# Patient Record
Sex: Female | Born: 1980 | Race: White | Hispanic: No | Marital: Married | Smoking: Current some day smoker
Health system: Southern US, Community
[De-identification: ages and names within clinical notes are randomized; demographics above are authoritative.]

## PROBLEM LIST (undated history)

## (undated) DIAGNOSIS — R002 Palpitations: Secondary | ICD-10-CM

## (undated) DIAGNOSIS — M797 Fibromyalgia: Secondary | ICD-10-CM

## (undated) DIAGNOSIS — F3111 Bipolar disorder, current episode manic without psychotic features, mild: Secondary | ICD-10-CM

## (undated) DIAGNOSIS — F32A Depression, unspecified: Secondary | ICD-10-CM

## (undated) DIAGNOSIS — M1990 Unspecified osteoarthritis, unspecified site: Secondary | ICD-10-CM

## (undated) DIAGNOSIS — K219 Gastro-esophageal reflux disease without esophagitis: Secondary | ICD-10-CM

## (undated) DIAGNOSIS — N189 Chronic kidney disease, unspecified: Secondary | ICD-10-CM

## (undated) DIAGNOSIS — F419 Anxiety disorder, unspecified: Secondary | ICD-10-CM

## (undated) DIAGNOSIS — F329 Major depressive disorder, single episode, unspecified: Secondary | ICD-10-CM

## (undated) DIAGNOSIS — R51 Headache: Secondary | ICD-10-CM

## (undated) HISTORY — PX: TUBAL LIGATION: SHX77

## (undated) HISTORY — PX: LAPAROSCOPIC TOTAL HYSTERECTOMY: SUR800

## (undated) HISTORY — DX: Anxiety disorder, unspecified: F41.9

## (undated) HISTORY — PX: OOPHORECTOMY: SHX86

## (undated) HISTORY — DX: Fibromyalgia: M79.7

## (undated) HISTORY — PX: CYSTOGRAPHY: SUR363

## (undated) HISTORY — DX: Chronic kidney disease, unspecified: N18.9

## (undated) HISTORY — DX: Depression, unspecified: F32.A

## (undated) HISTORY — PX: CHOLECYSTECTOMY: SHX55

---
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
## 2000-08-19 ENCOUNTER — Other Ambulatory Visit: Admission: RE | Admit: 2000-08-19 | Discharge: 2000-08-19 | Payer: Self-pay | Admitting: Obstetrics and Gynecology

## 2004-01-26 ENCOUNTER — Emergency Department (HOSPITAL_COMMUNITY): Admission: EM | Admit: 2004-01-26 | Discharge: 2004-01-26 | Payer: Self-pay | Admitting: Emergency Medicine

## 2004-06-01 ENCOUNTER — Emergency Department (HOSPITAL_COMMUNITY): Admission: EM | Admit: 2004-06-01 | Discharge: 2004-06-01 | Payer: Self-pay | Admitting: Emergency Medicine

## 2004-06-15 ENCOUNTER — Ambulatory Visit: Payer: Self-pay | Admitting: Gastroenterology

## 2004-06-21 ENCOUNTER — Ambulatory Visit (HOSPITAL_COMMUNITY): Admission: RE | Admit: 2004-06-21 | Discharge: 2004-06-21 | Payer: Self-pay | Admitting: Gastroenterology

## 2011-03-28 ENCOUNTER — Emergency Department (HOSPITAL_COMMUNITY)
Admission: EM | Admit: 2011-03-28 | Discharge: 2011-03-28 | Disposition: A | Discharge status: Home / Self Care | Payer: Self-pay | Attending: Emergency Medicine | Admitting: Emergency Medicine

## 2011-03-28 DIAGNOSIS — M5020 Other cervical disc displacement, unspecified cervical region: Secondary | ICD-10-CM | POA: Insufficient documentation

## 2011-03-28 DIAGNOSIS — W19XXXA Unspecified fall, initial encounter: Secondary | ICD-10-CM | POA: Insufficient documentation

## 2011-03-28 DIAGNOSIS — M79609 Pain in unspecified limb: Secondary | ICD-10-CM | POA: Insufficient documentation

## 2011-03-28 DIAGNOSIS — R209 Unspecified disturbances of skin sensation: Secondary | ICD-10-CM | POA: Insufficient documentation

## 2011-03-28 DIAGNOSIS — R5381 Other malaise: Secondary | ICD-10-CM | POA: Insufficient documentation

## 2011-03-29 ENCOUNTER — Ambulatory Visit (HOSPITAL_COMMUNITY)
Admit: 2011-03-29 | Discharge: 2011-03-29 | Disposition: A | Discharge status: Home / Self Care | Payer: Self-pay | Attending: Emergency Medicine | Admitting: Emergency Medicine

## 2011-03-29 DIAGNOSIS — M5030 Other cervical disc degeneration, unspecified cervical region: Secondary | ICD-10-CM | POA: Insufficient documentation

## 2011-03-29 DIAGNOSIS — M79609 Pain in unspecified limb: Secondary | ICD-10-CM | POA: Insufficient documentation

## 2011-03-29 DIAGNOSIS — R29898 Other symptoms and signs involving the musculoskeletal system: Secondary | ICD-10-CM | POA: Insufficient documentation

## 2011-03-29 DIAGNOSIS — M5020 Other cervical disc displacement, unspecified cervical region: Secondary | ICD-10-CM | POA: Insufficient documentation

## 2012-09-16 ENCOUNTER — Other Ambulatory Visit (HOSPITAL_COMMUNITY): Payer: Self-pay | Admitting: Orthopaedic Surgery

## 2012-09-21 ENCOUNTER — Encounter (HOSPITAL_COMMUNITY): Payer: Self-pay | Admitting: Pharmacy Technician

## 2012-09-23 ENCOUNTER — Encounter (HOSPITAL_COMMUNITY)
Admission: RE | Admit: 2012-09-23 | Discharge: 2012-09-23 | Disposition: A | Discharge status: Home / Self Care | Payer: Medicaid Other | Source: Ambulatory Visit | Attending: Orthopaedic Surgery | Admitting: Orthopaedic Surgery

## 2012-09-23 ENCOUNTER — Encounter (HOSPITAL_COMMUNITY): Payer: Self-pay

## 2012-09-23 ENCOUNTER — Other Ambulatory Visit (HOSPITAL_COMMUNITY): Payer: Self-pay | Admitting: Orthopaedic Surgery

## 2012-09-23 HISTORY — DX: Bipolar disorder, current episode manic without psychotic features, mild: F31.11

## 2012-09-23 HISTORY — DX: Gastro-esophageal reflux disease without esophagitis: K21.9

## 2012-09-23 HISTORY — DX: Headache: R51

## 2012-09-23 LAB — SURGICAL PCR SCREEN
MRSA, PCR: NEGATIVE
Staphylococcus aureus: NEGATIVE

## 2012-09-23 LAB — COMPREHENSIVE METABOLIC PANEL
ALT: 12 U/L (ref 0–35)
AST: 14 U/L (ref 0–37)
Albumin: 3.5 g/dL (ref 3.5–5.2)
Alkaline Phosphatase: 117 U/L (ref 39–117)
BUN: 10 mg/dL (ref 6–23)
CO2: 26 mEq/L (ref 19–32)
Calcium: 9 mg/dL (ref 8.4–10.5)
Chloride: 107 mEq/L (ref 96–112)
Creatinine, Ser: 0.67 mg/dL (ref 0.50–1.10)
GFR calc Af Amer: >90 mL/min (ref >90)
GFR calc non Af Amer: >90 mL/min (ref >90)
Glucose, Bld: 83 mg/dL (ref 70–99)
Potassium: 3.9 mEq/L (ref 3.5–5.1)
Sodium: 142 mEq/L (ref 135–145)
Total Bilirubin: 0.2 mg/dL — ABNORMAL LOW (ref 0.3–1.2)
Total Protein: 7.3 g/dL (ref 6.0–8.3)

## 2012-09-23 LAB — URINALYSIS, ROUTINE W REFLEX MICROSCOPIC
Bilirubin Urine: NEGATIVE
Glucose, UA: NEGATIVE mg/dL
Ketones, ur: NEGATIVE mg/dL
Leukocytes, UA: NEGATIVE
Nitrite: NEGATIVE
Protein, ur: 30 mg/dL — AB
Specific Gravity, Urine: 1.022 (ref 1.005–1.030)
Urobilinogen, UA: 0.2 mg/dL (ref 0.0–1.0)
pH: 5.5 (ref 5.0–8.0)

## 2012-09-23 LAB — URINE MICROSCOPIC-ADD ON

## 2012-09-23 LAB — CBC
HCT: 41 % (ref 36.0–46.0)
Hemoglobin: 13.8 g/dL (ref 12.0–15.0)
MCH: 31 pg (ref 26.0–34.0)
MCHC: 33.7 g/dL (ref 30.0–36.0)
MCV: 92.1 fL (ref 78.0–100.0)
Platelets: 420 10*3/uL — ABNORMAL HIGH (ref 150–400)
RBC: 4.45 MIL/uL (ref 3.87–5.11)
RDW: 13.5 % (ref 11.5–15.5)
WBC: 12.3 10*3/uL — ABNORMAL HIGH (ref 4.0–10.5)

## 2012-09-23 LAB — HCG, SERUM, QUALITATIVE: Preg, Serum: NEGATIVE

## 2012-09-23 NOTE — Pre-Procedure Instructions (Signed)
Shannon Spears  09/23/2012   Your procedure is scheduled on:  Sep 25, 2102  Report to [facility] at 5:30 AM.  Call this number if you have problems the morning of surgery: [phone]   Remember:   Do not eat food or drink liquids after midnight.   Take these medicines the morning of surgery with A SIP OF WATER: PRILOSEC   Do not wear jewelry, make-up or nail polish.  Do not wear lotions, powders, or perfumes. You may wear deodorant.  Do not shave 48 hours prior to surgery. Men may shave face and neck.  Do not bring valuables to the hospital.  Contacts, dentures or bridgework may not be worn into surgery.  Leave suitcase in the car. After surgery it may be brought to your room.  For patients admitted to the hospital, checkout time is 11:00 AM the day of  discharge.   Patients discharged the day of surgery will not be allowed to drive  home.  Name and phone number of your driver:   Special Instructions: Shower using CHG 2 nights before surgery and the night before surgery.  If you shower the day of surgery use CHG.  Use special wash - you have one bottle of CHG for all showers.  You should use approximately 1/3 of the bottle for each shower.   Please read over the following fact sheets that you were given: Pain Booklet, Coughing and Deep Breathing and Surgical Site Infection Prevention

## 2012-09-24 MED ORDER — CEFAZOLIN SODIUM-DEXTROSE 2-3 GM-% IV SOLR: 2.0000 g | INTRAVENOUS | Status: AC

## 2012-09-29 NOTE — H&P (Signed)
Shannon Spears is an 32 y.o. female.   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  She has now developed weakness in the right UE and no relief of her pain with conservative treatments of NSAIDS and analgesics.  She has been scheduled for surgery previously but had to cancel secondary to pregnancy. MRI on 09/12/12 showed degenerative disc disease at C4-5 and C5-6.  Right foraminal stenosis and cord flattening at the C4-5 level and progressive stenosis at C5-6 with left disc protrusion which has progressed since the last study and now with left foraminal encroachment.  She wishes to proceed with surgery.  Past Medical History  Diagnosis Date  . GERD (gastroesophageal reflux disease)   . Headache   . Bipolar 1 disorder, manic, mild     Past Surgical History  Procedure Laterality Date  . Cystography      History reviewed. No pertinent family history. Social History:  reports that she has been smoking Cigarettes.  She has been smoking about 0.50 packs per day. She does not have any smokeless tobacco history on file. She reports that  drinks alcohol. She reports that she does not use illicit drugs.  Allergies:  Allergies  Allergen Reactions  . Butrans (Buprenorphine) Shortness Of Breath and Rash  . Codeine     Mouth went numb  . Penicillins Nausea And Vomiting and Swelling  . Tetanus Toxoids     Blister on legs  . Vicodin (Hydrocodone-Acetaminophen) Nausea And Vomiting and Swelling    Medications Prior to Admission  Medication Sig Dispense Refill  . omeprazole (PRILOSEC) 20 MG capsule Take 20 mg by mouth daily.      . Prenatal Vit-Fe Fumarate-FA (PRENATAL MULTIVITAMIN) TABS Take 1 tablet by mouth daily.      . traMADol (ULTRAM) 50 MG tablet Take 50 mg by mouth 2 (two) times daily as needed for pain.        No results found for this or any previous visit (from the past 48 hour(s)). No results  found.  Review of Systems  Constitutional: Negative.   HENT: Positive for neck pain.   Eyes: Negative.   Respiratory: Negative.   Cardiovascular: Negative.   Gastrointestinal: Negative.   Genitourinary: Negative.   Skin: Negative.   Neurological: Positive for focal weakness.       Both arms.  Right > left  Endo/Heme/Allergies: Negative.   Psychiatric/Behavioral: The patient is nervous/anxious.        Bipolar disease.    Blood pressure 107/71, pulse 80, temperature 97.9 F (36.6 C), temperature source Oral, resp. rate 20, last menstrual period 10/02/2012, SpO2 99.00%. Physical Exam  Constitutional: She is oriented to person, place, and time. She appears well-developed and well-nourished.  HENT:  Head: Normocephalic and atraumatic.  Eyes: EOM are normal. Pupils are equal, round, and reactive to light.  Neck:  Decreased ROM C spine.  Cardiovascular: Normal rate.   Respiratory: Effort normal.  GI: Soft.  Musculoskeletal:  Right biceps weakness 5-/5.  Otherwise motor exam intact.  DTRs +1 and symmetric.  Neurological: She is alert and oriented to person, place, and time.  Skin: Skin is warm and dry.     Assessment/Plan C4-5 Degenerative disc disease with right foraminal stenosis and cord flattening. C5-6 Degenerative disc disease with HNP and left foraminal stenosis.  PLAN: Anterior cervical discectomy and fusion C4-5 and C5-6 with allograft, plate and screws.  YATES,MARK C  10/02/2012, 9:36 AM

## 2012-10-01 ENCOUNTER — Encounter (HOSPITAL_COMMUNITY): Payer: Self-pay | Admitting: *Deleted

## 2012-10-02 ENCOUNTER — Encounter (HOSPITAL_COMMUNITY): Payer: Self-pay | Admitting: Vascular Surgery

## 2012-10-02 ENCOUNTER — Inpatient Hospital Stay (HOSPITAL_COMMUNITY): Payer: Medicaid Other

## 2012-10-02 ENCOUNTER — Inpatient Hospital Stay (HOSPITAL_COMMUNITY)
Admission: RE | Admit: 2012-10-02 | Discharge: 2012-10-03 | DRG: 472 | Disposition: A | Discharge status: Home / Self Care | Payer: Medicaid Other | Source: Ambulatory Visit | Attending: Orthopaedic Surgery | Admitting: Orthopaedic Surgery

## 2012-10-02 ENCOUNTER — Inpatient Hospital Stay (HOSPITAL_COMMUNITY): Payer: Medicaid Other | Admitting: Certified Registered Nurse Anesthetist

## 2012-10-02 ENCOUNTER — Encounter (HOSPITAL_COMMUNITY): Payer: Self-pay | Admitting: Certified Registered Nurse Anesthetist

## 2012-10-02 ENCOUNTER — Encounter (HOSPITAL_COMMUNITY)
Admission: RE | Disposition: A | Discharge status: Home / Self Care | Payer: Self-pay | Source: Ambulatory Visit | Attending: Orthopaedic Surgery

## 2012-10-02 DIAGNOSIS — M5020 Other cervical disc displacement, unspecified cervical region: Principal | ICD-10-CM | POA: Diagnosis present

## 2012-10-02 DIAGNOSIS — M5030 Other cervical disc degeneration, unspecified cervical region: Secondary | ICD-10-CM | POA: Diagnosis present

## 2012-10-02 DIAGNOSIS — F17200 Nicotine dependence, unspecified, uncomplicated: Secondary | ICD-10-CM | POA: Diagnosis present

## 2012-10-02 DIAGNOSIS — Z01812 Encounter for preprocedural laboratory examination: Secondary | ICD-10-CM

## 2012-10-02 DIAGNOSIS — Z79899 Other long term (current) drug therapy: Secondary | ICD-10-CM

## 2012-10-02 DIAGNOSIS — Z01818 Encounter for other preprocedural examination: Secondary | ICD-10-CM

## 2012-10-02 DIAGNOSIS — Z01810 Encounter for preprocedural cardiovascular examination: Secondary | ICD-10-CM

## 2012-10-02 DIAGNOSIS — F3111 Bipolar disorder, current episode manic without psychotic features, mild: Secondary | ICD-10-CM | POA: Diagnosis present

## 2012-10-02 HISTORY — PX: ANTERIOR CERVICAL DECOMP/DISCECTOMY FUSION: SHX1161

## 2012-10-02 SURGERY — ANTERIOR CERVICAL DECOMPRESSION/DISCECTOMY FUSION 2 LEVELS
Anesthesia: General | Site: Spine Cervical | Wound class: Clean

## 2012-10-02 MED ORDER — OXYCODONE-ACETAMINOPHEN 5-325 MG PO TABS
1.0000 | ORAL_TABLET | ORAL | Status: DC | PRN
  Administered 2012-10-02 – 2012-10-03 (×3): 2 via ORAL
  Filled 2012-10-02 (×3): qty 2

## 2012-10-02 MED ORDER — EPHEDRINE SULFATE 50 MG/ML IJ SOLN
INTRAMUSCULAR | Status: DC | PRN
  Administered 2012-10-02: 10 mg via INTRAVENOUS
  Administered 2012-10-02: 15 mg via INTRAVENOUS

## 2012-10-02 MED ORDER — FLEET ENEMA 7-19 GM/118ML RE ENEM: 1.0000 | ENEMA | Freq: Once | RECTAL | Status: AC | PRN

## 2012-10-02 MED ORDER — OXYCODONE HCL 5 MG/5ML PO SOLN: 5.0000 mg | Freq: Once | ORAL | Status: AC | PRN

## 2012-10-02 MED ORDER — BUPIVACAINE-EPINEPHRINE 0.25% -1:200000 IJ SOLN
INTRAMUSCULAR | Status: DC | PRN
  Administered 2012-10-02: 8 mL

## 2012-10-02 MED ORDER — ROCURONIUM BROMIDE 100 MG/10ML IV SOLN
INTRAVENOUS | Status: DC | PRN
  Administered 2012-10-02: 50 mg via INTRAVENOUS

## 2012-10-02 MED ORDER — HYDROMORPHONE HCL PF 1 MG/ML IJ SOLN
INTRAMUSCULAR | Status: AC
  Administered 2012-10-02: 0.5 mg via INTRAVENOUS
  Filled 2012-10-02: qty 1

## 2012-10-02 MED ORDER — METHOCARBAMOL 500 MG PO TABS
ORAL_TABLET | ORAL | Status: AC
  Administered 2012-10-02: 500 mg via ORAL
  Filled 2012-10-02: qty 1

## 2012-10-02 MED ORDER — OXYCODONE HCL 5 MG PO TABS
ORAL_TABLET | ORAL | Status: AC
  Filled 2012-10-02: qty 1

## 2012-10-02 MED ORDER — ACETAMINOPHEN 325 MG PO TABS: 650.0000 mg | ORAL_TABLET | ORAL | Status: DC | PRN

## 2012-10-02 MED ORDER — LACTATED RINGERS IV SOLN
INTRAVENOUS | Status: DC
  Administered 2012-10-02: 09:00:00 via INTRAVENOUS

## 2012-10-02 MED ORDER — FENTANYL CITRATE 0.05 MG/ML IJ SOLN
INTRAMUSCULAR | Status: DC | PRN
  Administered 2012-10-02: 50 ug via INTRAVENOUS
  Administered 2012-10-02: 100 ug via INTRAVENOUS
  Administered 2012-10-02: 150 ug via INTRAVENOUS
  Administered 2012-10-02: 50 ug via INTRAVENOUS

## 2012-10-02 MED ORDER — CEFAZOLIN SODIUM-DEXTROSE 2-3 GM-% IV SOLR
INTRAVENOUS | Status: AC
  Filled 2012-10-02: qty 50

## 2012-10-02 MED ORDER — MENTHOL 3 MG MT LOZG: 1.0000 | LOZENGE | OROMUCOSAL | Status: DC | PRN

## 2012-10-02 MED ORDER — BISACODYL 10 MG RE SUPP: 10.0000 mg | Freq: Every day | RECTAL | Status: DC | PRN

## 2012-10-02 MED ORDER — KETOROLAC TROMETHAMINE 30 MG/ML IJ SOLN
30.0000 mg | Freq: Four times a day (QID) | INTRAMUSCULAR | Status: AC
  Administered 2012-10-02 – 2012-10-03 (×3): 30 mg via INTRAVENOUS
  Filled 2012-10-02 (×3): qty 1

## 2012-10-02 MED ORDER — PANTOPRAZOLE SODIUM 40 MG PO TBEC
40.0000 mg | DELAYED_RELEASE_TABLET | Freq: Every day | ORAL | Status: DC
  Administered 2012-10-02 – 2012-10-03 (×2): 40 mg via ORAL
  Filled 2012-10-02 (×2): qty 1

## 2012-10-02 MED ORDER — METHOCARBAMOL 500 MG PO TABS: 500.0000 mg | ORAL_TABLET | Freq: Four times a day (QID) | ORAL | Status: DC | PRN

## 2012-10-02 MED ORDER — CEFAZOLIN SODIUM 1-5 GM-% IV SOLN
1.0000 g | Freq: Three times a day (TID) | INTRAVENOUS | Status: AC
  Administered 2012-10-02 – 2012-10-03 (×2): 1 g via INTRAVENOUS
  Filled 2012-10-02 (×2): qty 50

## 2012-10-02 MED ORDER — SODIUM CHLORIDE 0.9 % IJ SOLN
3.0000 mL | Freq: Two times a day (BID) | INTRAMUSCULAR | Status: DC
  Administered 2012-10-02 – 2012-10-03 (×2): 3 mL via INTRAVENOUS

## 2012-10-02 MED ORDER — KCL IN DEXTROSE-NACL 20-5-0.45 MEQ/L-%-% IV SOLN
INTRAVENOUS | Status: DC
  Administered 2012-10-02: 15:00:00 via INTRAVENOUS
  Filled 2012-10-02 (×4): qty 1000

## 2012-10-02 MED ORDER — PHENOL 1.4 % MT LIQD
1.0000 | OROMUCOSAL | Status: DC | PRN
  Administered 2012-10-02: 1 via OROMUCOSAL
  Filled 2012-10-02: qty 177

## 2012-10-02 MED ORDER — 0.9 % SODIUM CHLORIDE (POUR BTL) OPTIME
TOPICAL | Status: DC | PRN
  Administered 2012-10-02: 1000 mL

## 2012-10-02 MED ORDER — ONDANSETRON HCL 4 MG/2ML IJ SOLN: 4.0000 mg | Freq: Once | INTRAMUSCULAR | Status: DC | PRN

## 2012-10-02 MED ORDER — THROMBIN 5000 UNITS EX SOLR
CUTANEOUS | Status: AC
  Filled 2012-10-02: qty 5000

## 2012-10-02 MED ORDER — SODIUM CHLORIDE 0.9 % IV SOLN: 250.0000 mL | INTRAVENOUS | Status: DC

## 2012-10-02 MED ORDER — MORPHINE SULFATE 2 MG/ML IJ SOLN
1.0000 mg | INTRAMUSCULAR | Status: DC | PRN
  Administered 2012-10-02 – 2012-10-03 (×2): 2 mg via INTRAVENOUS
  Filled 2012-10-02 (×2): qty 1

## 2012-10-02 MED ORDER — LACTATED RINGERS IV SOLN
INTRAVENOUS | Status: DC | PRN
  Administered 2012-10-02 (×2): via INTRAVENOUS

## 2012-10-02 MED ORDER — ACETAMINOPHEN 650 MG RE SUPP: 650.0000 mg | RECTAL | Status: DC | PRN

## 2012-10-02 MED ORDER — MIDAZOLAM HCL 5 MG/5ML IJ SOLN
INTRAMUSCULAR | Status: DC | PRN
  Administered 2012-10-02: 2 mg via INTRAVENOUS

## 2012-10-02 MED ORDER — ONDANSETRON HCL 4 MG/2ML IJ SOLN
INTRAMUSCULAR | Status: DC | PRN
  Administered 2012-10-02: 4 mg via INTRAVENOUS

## 2012-10-02 MED ORDER — ONDANSETRON HCL 4 MG/2ML IJ SOLN
4.0000 mg | INTRAMUSCULAR | Status: DC | PRN
  Administered 2012-10-02: 4 mg via INTRAVENOUS
  Filled 2012-10-02: qty 2

## 2012-10-02 MED ORDER — SODIUM CHLORIDE 0.9 % IJ SOLN: 3.0000 mL | INTRAMUSCULAR | Status: DC | PRN

## 2012-10-02 MED ORDER — METHOCARBAMOL 100 MG/ML IJ SOLN
500.0000 mg | Freq: Four times a day (QID) | INTRAVENOUS | Status: DC | PRN
  Filled 2012-10-02: qty 5

## 2012-10-02 MED ORDER — MEPERIDINE HCL 25 MG/ML IJ SOLN: 6.2500 mg | INTRAMUSCULAR | Status: DC | PRN

## 2012-10-02 MED ORDER — PROPOFOL 10 MG/ML IV BOLUS
INTRAVENOUS | Status: DC | PRN
  Administered 2012-10-02: 200 mg via INTRAVENOUS

## 2012-10-02 MED ORDER — DIPHENHYDRAMINE HCL 12.5 MG/5ML PO ELIX
12.5000 mg | ORAL_SOLUTION | Freq: Four times a day (QID) | ORAL | Status: DC | PRN
  Administered 2012-10-02: 12.5 mg via ORAL
  Filled 2012-10-02: qty 10

## 2012-10-02 MED ORDER — METHOCARBAMOL 500 MG PO TABS
500.0000 mg | ORAL_TABLET | Freq: Four times a day (QID) | ORAL | Status: DC | PRN
  Administered 2012-10-02 – 2012-10-03 (×3): 500 mg via ORAL
  Filled 2012-10-02 (×3): qty 1

## 2012-10-02 MED ORDER — SENNOSIDES-DOCUSATE SODIUM 8.6-50 MG PO TABS: 1.0000 | ORAL_TABLET | Freq: Every evening | ORAL | Status: DC | PRN

## 2012-10-02 MED ORDER — THROMBIN 5000 UNITS EX SOLR
OROMUCOSAL | Status: DC | PRN
  Administered 2012-10-02: 11:00:00 via TOPICAL

## 2012-10-02 MED ORDER — CEFAZOLIN SODIUM-DEXTROSE 2-3 GM-% IV SOLR
2.0000 g | Freq: Once | INTRAVENOUS | Status: AC
  Administered 2012-10-02: 2 g via INTRAVENOUS

## 2012-10-02 MED ORDER — OXYCODONE HCL 5 MG PO TABS
5.0000 mg | ORAL_TABLET | Freq: Once | ORAL | Status: AC | PRN
  Administered 2012-10-02: 5 mg via ORAL

## 2012-10-02 MED ORDER — LIDOCAINE HCL (CARDIAC) 20 MG/ML IV SOLN
INTRAVENOUS | Status: DC | PRN
  Administered 2012-10-02: 100 mg via INTRAVENOUS

## 2012-10-02 MED ORDER — GLYCOPYRROLATE 0.2 MG/ML IJ SOLN
INTRAMUSCULAR | Status: DC | PRN
  Administered 2012-10-02: .4 mg via INTRAVENOUS

## 2012-10-02 MED ORDER — KETOROLAC TROMETHAMINE 30 MG/ML IJ SOLN
INTRAMUSCULAR | Status: AC
  Administered 2012-10-02: 30 mg via INTRAVENOUS
  Filled 2012-10-02: qty 1

## 2012-10-02 MED ORDER — DOCUSATE SODIUM 100 MG PO CAPS
100.0000 mg | ORAL_CAPSULE | Freq: Two times a day (BID) | ORAL | Status: DC
  Administered 2012-10-02 – 2012-10-03 (×3): 100 mg via ORAL
  Filled 2012-10-02 (×3): qty 1

## 2012-10-02 MED ORDER — PRENATAL MULTIVITAMIN CH
1.0000 | ORAL_TABLET | Freq: Every day | ORAL | Status: DC
  Administered 2012-10-03: 1 via ORAL
  Filled 2012-10-02 (×2): qty 1

## 2012-10-02 MED ORDER — ZOLPIDEM TARTRATE 5 MG PO TABS: 5.0000 mg | ORAL_TABLET | Freq: Every evening | ORAL | Status: DC | PRN

## 2012-10-02 MED ORDER — OXYCODONE-ACETAMINOPHEN 5-325 MG PO TABS: 1.0000 | ORAL_TABLET | ORAL | Status: DC | PRN

## 2012-10-02 MED ORDER — HYDROMORPHONE HCL PF 1 MG/ML IJ SOLN
0.2500 mg | INTRAMUSCULAR | Status: DC | PRN
  Administered 2012-10-02 (×4): 0.5 mg via INTRAVENOUS

## 2012-10-02 MED ORDER — NEOSTIGMINE METHYLSULFATE 1 MG/ML IJ SOLN
INTRAMUSCULAR | Status: DC | PRN
  Administered 2012-10-02: 3 mg via INTRAVENOUS

## 2012-10-02 SURGICAL SUPPLY — 53 items
BENZOIN TINCTURE PRP APPL 2/3 (GAUZE/BANDAGES/DRESSINGS) ×2 IMPLANT
BIT DRILL SKYLINE 12MM (BIT) ×1 IMPLANT
BLADE SURG ROTATE 9660 (MISCELLANEOUS) IMPLANT
BONE CERV LORDOTIC 14.5X12X6 (Bone Implant) ×4 IMPLANT
BUR ROUND FLUTED 4 SOFT TCH (BURR) IMPLANT
CLOTH BEACON ORANGE TIMEOUT ST (SAFETY) ×2 IMPLANT
CLSR STERI-STRIP ANTIMIC 1/2X4 (GAUZE/BANDAGES/DRESSINGS) ×2 IMPLANT
COLLAR CERV LO CONTOUR FIRM DE (SOFTGOODS) ×2 IMPLANT
CORDS BIPOLAR (ELECTRODE) IMPLANT
COVER SURGICAL LIGHT HANDLE (MISCELLANEOUS) ×2 IMPLANT
DRAPE C-ARM 42X72 X-RAY (DRAPES) ×2 IMPLANT
DRAPE MICROSCOPE LEICA (MISCELLANEOUS) ×2 IMPLANT
DRAPE PROXIMA HALF (DRAPES) ×2 IMPLANT
DRILL BIT SKYLINE 12MM (BIT) ×1
DURAPREP 6ML APPLICATOR 50/CS (WOUND CARE) ×2 IMPLANT
ELECT COATED BLADE 2.86 ST (ELECTRODE) ×2 IMPLANT
ELECT REM PT RETURN 9FT ADLT (ELECTROSURGICAL) ×2
ELECTRODE REM PT RTRN 9FT ADLT (ELECTROSURGICAL) ×1 IMPLANT
EVACUATOR 1/8 PVC DRAIN (DRAIN) ×2 IMPLANT
GAUZE XEROFORM 1X8 LF (GAUZE/BANDAGES/DRESSINGS) ×2 IMPLANT
GLOVE BIOGEL PI IND STRL 7.5 (GLOVE) ×1 IMPLANT
GLOVE BIOGEL PI IND STRL 8 (GLOVE) ×1 IMPLANT
GLOVE BIOGEL PI INDICATOR 7.5 (GLOVE) ×1
GLOVE BIOGEL PI INDICATOR 8 (GLOVE) ×1
GLOVE ECLIPSE 7.0 STRL STRAW (GLOVE) ×2 IMPLANT
GLOVE ORTHO TXT STRL SZ7.5 (GLOVE) ×2 IMPLANT
GOWN PREVENTION PLUS LG XLONG (DISPOSABLE) IMPLANT
GOWN PREVENTION PLUS XLARGE (GOWN DISPOSABLE) ×2 IMPLANT
GOWN STRL NON-REIN LRG LVL3 (GOWN DISPOSABLE) ×4 IMPLANT
HEAD HALTER (SOFTGOODS) ×2 IMPLANT
HEMOSTAT SURGICEL 2X14 (HEMOSTASIS) IMPLANT
KIT BASIN OR (CUSTOM PROCEDURE TRAY) ×2 IMPLANT
KIT ROOM TURNOVER OR (KITS) ×2 IMPLANT
MANIFOLD NEPTUNE II (INSTRUMENTS) ×2 IMPLANT
NEEDLE 25GX 5/8IN NON SAFETY (NEEDLE) ×2 IMPLANT
NS IRRIG 1000ML POUR BTL (IV SOLUTION) ×2 IMPLANT
PACK ORTHO CERVICAL (CUSTOM PROCEDURE TRAY) ×2 IMPLANT
PAD ARMBOARD 7.5X6 YLW CONV (MISCELLANEOUS) ×4 IMPLANT
PATTIES SURGICAL .5 X.5 (GAUZE/BANDAGES/DRESSINGS) IMPLANT
PIN TEMP SKYLINE THREADED (PIN) ×2 IMPLANT
PLATE TWO LEVEL SKYLINE 30MM (Plate) ×2 IMPLANT
SCREW VARIABLE SELF TAP 12MM (Screw) ×12 IMPLANT
SPONGE GAUZE 4X4 12PLY (GAUZE/BANDAGES/DRESSINGS) ×2 IMPLANT
SPONGE SURGIFOAM ABS GEL SZ50 (HEMOSTASIS) ×2 IMPLANT
STRIP CLOSURE SKIN 1/2X4 (GAUZE/BANDAGES/DRESSINGS) ×2 IMPLANT
SURGIFLO TRUKIT (HEMOSTASIS) IMPLANT
SUT VIC AB 3-0 X1 27 (SUTURE) ×2 IMPLANT
SUT VICRYL 4-0 PS2 18IN ABS (SUTURE) ×4 IMPLANT
SYR 30ML SLIP (SYRINGE) ×2 IMPLANT
TAPE CLOTH SURG 4X10 WHT LF (GAUZE/BANDAGES/DRESSINGS) ×2 IMPLANT
TOWEL OR 17X24 6PK STRL BLUE (TOWEL DISPOSABLE) ×2 IMPLANT
TOWEL OR 17X26 10 PK STRL BLUE (TOWEL DISPOSABLE) ×2 IMPLANT
WATER STERILE IRR 1000ML POUR (IV SOLUTION) ×2 IMPLANT

## 2012-10-02 NOTE — Anesthesia Postprocedure Evaluation (Signed)
Anesthesia Post Note  Patient: Shannon Spears  Procedure(s) Performed: Procedure(s) (LRB): C4-5, C5-6 anterior cervical discectomy and fusion, allograft plate (N/A)  Anesthesia type: general  Patient location: PACU  Post pain: Pain level controlled  Post assessment: Patient's Cardiovascular Status Stable  Last Vitals:  Filed Vitals:   10/02/12 1430  BP: 107/66  Pulse: 83  Temp: 36.7 C  Resp: 23    Post vital signs: Reviewed and stable  Level of consciousness: sedated  Complications: No apparent anesthesia complications

## 2012-10-02 NOTE — Preoperative (Signed)
Beta Blockers   Reason not to administer Beta Blockers:Not Applicable 

## 2012-10-02 NOTE — Anesthesia Preprocedure Evaluation (Addendum)
Anesthesia Evaluation  Patient identified by MRN, date of birth, ID band Patient awake    Reviewed: Allergy & Precautions, H&P , NPO status , Patient's Chart, lab work & pertinent test results, reviewed documented beta blocker date and time   Airway Mallampati: I TM Distance: >3 FB Neck ROM: Full    Dental  (+) Edentulous Upper, Partial Lower and Dental Advisory Given   Pulmonary Current Smoker,  Half pack per day cigs         Cardiovascular     Neuro/Psych    GI/Hepatic GERD-  Medicated and Controlled,  Endo/Other    Renal/GU      Musculoskeletal   Abdominal   Peds  Hematology   Anesthesia Other Findings   Reproductive/Obstetrics                         Anesthesia Physical Anesthesia Plan  ASA: II  Anesthesia Plan: General   Post-op Pain Management:    Induction: Intravenous  Airway Management Planned: Oral ETT  Additional Equipment:   Intra-op Plan:   Post-operative Plan: Extubation in OR  Informed Consent:   Plan Discussed with: CRNA and Surgeon  Anesthesia Plan Comments:         Anesthesia Quick Evaluation

## 2012-10-02 NOTE — Progress Notes (Signed)
Lunch relief by MA Benson Porcaro RN 

## 2012-10-02 NOTE — Progress Notes (Signed)
Patient ID: Shannon Spears, female   DOB: 08-07-1980, 32 y.o.   MRN: [id number] Anticipate discharge tomorrow if doing well.  RX for Percocet and Robaxin on chart as well as AVS.  Drain will be removed on rounds and mepilex place on wound before discharge.

## 2012-10-02 NOTE — Anesthesia Procedure Notes (Signed)
Procedure Name: Intubation Date/Time: 10/02/2012 10:28 AM Performed by: Rogelia Boga Pre-anesthesia Checklist: Patient identified, Emergency Drugs available, Suction available, Patient being monitored and Timeout performed Patient Re-evaluated:Patient Re-evaluated prior to inductionOxygen Delivery Method: Circle system utilized Preoxygenation: Pre-oxygenation with 100% oxygen Intubation Type: IV induction Ventilation: Mask ventilation without difficulty Laryngoscope Size: Mac and 4 Grade View: Grade I Tube type: Oral Tube size: 7.5 mm Number of attempts: 1 Airway Equipment and Method: Stylet Placement Confirmation: ETT inserted through vocal cords under direct vision,  positive ETCO2 and breath sounds checked- equal and bilateral Secured at: 22 cm Tube secured with: Tape Dental Injury: Teeth and Oropharynx as per pre-operative assessment

## 2012-10-02 NOTE — Progress Notes (Signed)
UR COMPLETED  

## 2012-10-02 NOTE — Interval H&P Note (Signed)
History and Physical Interval Note:  10/02/2012 9:38 AM  Shannon Spears  has presented today for surgery, with the diagnosis of C4-5, C5-6 HNP, stenosis  The various methods of treatment have been discussed with the patient and family. After consideration of risks, benefits and other options for treatment, the patient has consented to  Procedure(s): C4-5, C5-6 anterior cervical discectomy and fusion, allograft plate (N/A) as a surgical intervention .  The patient's history has been reviewed, patient examined, no change in status, stable for surgery.  I have reviewed the patient's chart and labs.  Questions were answered to the patient's satisfaction.     Nelma Phagan C

## 2012-10-02 NOTE — Brief Op Note (Cosign Needed)
10/02/2012  12:40 PM  PATIENT:  Shannon Spears  32 y.o. female  PRE-OPERATIVE DIAGNOSIS:  C4-5, C5-6 HNP, stenosis  POST-OPERATIVE DIAGNOSIS:  C4-5, C5-6 HNP, stenosis  PROCEDURE:  Procedure(s): C4-5, C5-6 anterior cervical discectomy and fusion, allograft plate (N/A)  SURGEON:  Surgeon(s) and Role:    * Eldred Manges, MD - Primary  PHYSICIAN ASSISTANT: Maud Deed [facility]  ASSISTANTS: none   ANESTHESIA:   general  EBL:  Total I/O In: 1000 [I.V.:1000] Out: 50 [Blood:50]  BLOOD ADMINISTERED:none  DRAINS: (1) Hemovact drain(s) in the anterior neck with  Suction Open   LOCAL MEDICATIONS USED:  MARCAINE     SPECIMEN:  No Specimen  DISPOSITION OF SPECIMEN:  N/A  COUNTS:  YES  TOURNIQUET:  * No tourniquets in log *  DICTATION: .Note written in EPIC  PLAN OF CARE: Admit to inpatient   PATIENT DISPOSITION:  PACU - hemodynamically stable.   Delay start of Pharmacological VTE agent (>24hrs) due to surgical blood loss or risk of bleeding: yes

## 2012-10-02 NOTE — Transfer of Care (Signed)
Immediate Anesthesia Transfer of Care Note  Patient: Shannon Spears  Procedure(s) Performed: Procedure(s): C4-5, C5-6 anterior cervical discectomy and fusion, allograft plate (N/A)  Patient Location: PACU  Anesthesia Type:General  Level of Consciousness: awake, alert , oriented and patient cooperative  Airway & Oxygen Therapy: Patient Spontanous Breathing and Patient connected to nasal cannula oxygen  Post-op Assessment: Report given to PACU RN, Post -op Vital signs reviewed and stable and Patient moving all extremities X 4  Post vital signs: Reviewed and stable  Complications: No apparent anesthesia complications

## 2012-10-03 NOTE — Progress Notes (Signed)
Subjective: Pt stable - having neck but no arm pain   Objective: Vital signs in last 24 hours: Temp:  [97.7 F (36.5 C)-98.3 F (36.8 C)] 97.7 F (36.5 C) (03/01 0600) Pulse Rate:  [64-109] 70 (03/01 0600) Resp:  [10-23] 18 (03/01 0600) BP: (90-119)/(60-74) 90/74 mmHg (03/01 0600) SpO2:  [97 %-99 %] 97 % (03/01 0600)  Intake/Output from previous day: 02/28 0701 - 03/01 0700 In: 1600 [I.V.:1600] Out: 50 [Blood:50] Intake/Output this shift: Total I/O In: 20 [P.O.:20] Out: -   Exam:  Neurovascular intact Sensation intact distally Intact pulses distally  Labs: No results found for this basename: HGB,  in the last 72 hours No results found for this basename: WBC, RBC, HCT, PLT,  in the last 72 hours No results found for this basename: NA, K, CL, CO2, BUN, CREATININE, GLUCOSE, CALCIUM,  in the last 72 hours No results found for this basename: LABPT, INR,  in the last 72 hours  Assessment/Plan: Pt stable - will tentatively plan on dc this afternoon after transition to oral pain meds   DEAN,GREGORY SCOTT 10/03/2012, 9:50 AM

## 2012-10-03 NOTE — Op Note (Signed)
Shannon Spears, Shannon Spears              ACCOUNT NO.:  [id number]  MEDICAL RECORD NO.:  [id number]  LOCATION:  [id number]                        FACILITY:  [facility]  PHYSICIAN:  Alexiz Cothran C. Ophelia Charter, M.D.    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  PROCEDURE:  C4-C5, C5-C6 anterior cervical diskectomy and fusion, allograft and plate.  SURGEON:  Ashrita Chrismer C. Ophelia Charter, MD  ASSISTANT:  Maud Deed, PA-C, medically necessary and present for the entire procedure.  ANESTHESIA:  GOT plus 8 mL of Marcaine local.  DRAINS:  One Hemovac, neck.  COMPLICATIONS:  None.  ESTIMATED BLOOD LOSS:  50 mL.  PROCEDURE IN DETAIL:  After induction of general anesthesia, orotracheal intubation, and halter traction without weight, neck was prepped with DuraPrep, arms were tucked to the side with pads over the ulnar nerve. No wrist restraints were applied.  Neck care was scored with towels. Sterile skin marker was used on prominent skin line at the appropriate level and Betadine, Steri-Drape was sterile Mayo stand at the head, thyroid sheet draped, half sheet at the top.  Time-out procedure was completed.  Appropriate images were displayed. The patient had disk extrusion at C5-C6 with moderate central stenosis and disk caudally migrated behind the C6 vertebrae.  At the C4-C5 level, disk was E-centered to the right with significant compression and lateral right fragment.  Platysma was split in line with the skin incision.  Gelpi retractor was placed, blunt dissection down the longus coli.  Short 25 needle placed C5-C6 level, confirmed with lateral C-arm.  C5-C6 had a chunk removed from it with the scalpel and pituitaries for marking it and then also the level just  above the C4-C5.  Cloward retractor blades were placed right and left, smooth blades up and down.  Operative microscope was draped, brought in, and diskectomy was performed using Cloward curettes. Bur was used, endplates were curetted, rasps.  Trial sizing showed that 6 gave excellent fit.  Operative microscope was used for take down of the posterior aspect of the disk, posterior longitudinal ligament, and removal of the extruded fragments that were caudally migrated causing compression.  Dura was decompressed from right to left side, small amount of bleeding occurred on the right gutter.  Small pieces of Gelfoam was placed.  It was dry at the time of the graft insertion. Once the graft was countersunk 2 mm, there was some bleeding epidurally it came up around the edges of the graft.  Self-retaining Cloward were moved up to 4 5 level.  Process was repeated.  Disk fragments were extruded centrally into the right and several amount of disk material was removed including free fragment, which was teased up and removed on the right side.  Trial sizes showed a 6 lordotic cortical cancellous graft DePuy gave a nice fit.  Skyline 2-level plate was selected.  It was checked under fluoroscopy, AP and lateral.  Prong was used for stabilization.  This was a 24-mm plate second from the  small size, 2- level plate in the set, and 12 mm screws were placed x6.  Final pictures were taken showing good position and alignment.  All Gelfoam was removed prior to placement of the graft.  Grafts lined up well in the midline in the plate, and final spot radiograph showed good position and alignment. Hemovac was placed in and out technique in line with skin incision on the left side.  Platysma was reapproximated with 3-0 Vicryl, 4-0 Vicryl subcuticular closure.  Tincture of benzoin, Steri-Strips, 4 x 4's dressing and soft cervical collar.  Instrument count and needle count was correct.     Shannon Spears C. Ophelia Charter,  M.D.     MCY/MEDQ  D:  10/02/2012  T:  10/03/2012  Job:  [id number]

## 2012-10-03 NOTE — Progress Notes (Signed)
Occupational Therapy Evaluation Patient Details Name: Shannon Spears MRN: [id number] DOB: [birth year] Today's Date: 10/03/2012 Time: 4098-1191 OT Time Calculation (min): 25 min  OT Assessment / Plan / Recommendation Clinical Impression  32 yo s/p ACDF. Completed all education regarding compensatory techniques and cervical precautions regarding ADL. Husband present and verbalized understaning. Pt asking about taking a shower, per D/C instructions, pt to not shower until after MD visit. Notified nsg of allergic reaction to adhesive.Written information given. Ready for D/C.    OT Assessment  Patient does not need any further OT services    Follow Up Recommendations  No OT follow up    Barriers to Discharge  none    Equipment Recommendations  None recommended by OT    Recommendations for Other Services    Frequency    eval only   Precautions / Restrictions Precautions Precautions: Cervical Required Braces or Orthoses: Cervical Brace (wear at all times) Cervical Brace: Soft collar   Pertinent Vitals/Pain C/o neck pain. nsg aware    ADL  Grooming: Set up Upper Body Bathing: Set up Lower Body Bathing: Set up Where Assessed - Lower Body Bathing: Unsupported sit to stand Upper Body Dressing: Set up Where Assessed - Upper Body Dressing: Unsupported sit to stand Lower Body Dressing: Set up;Supervision/safety Where Assessed - Lower Body Dressing: Unsupported sit to stand Toilet Transfer: Modified independent Toilet Transfer Method: Sit to Barista: Comfort height toilet Toileting - Clothing Manipulation and Hygiene: Modified independent Where Assessed - Engineer, mining and Hygiene: Standing Tub/Shower Transfer: Therapist, sports Method: Ambulating Transfers/Ambulation Related to ADLs: mod I ADL Comments: Educated pt on cervical precautions and compensatory techniques for ADL    OT Diagnosis:    OT Problem List:   OT  Treatment Interventions:     OT Goals Acute Rehab OT Goals OT Goal Formulation:  (eval only)  Visit Information  Last OT Received On: 10/03/12 Assistance Needed: +1    Subjective Data      Prior Functioning     Home Living Lives With: Spouse Available Help at Discharge: Family;Available 24 hours/day Type of Home: House Home Access: Stairs to enter [employer] of Steps: 3 Home Layout: Two level Alternate Level Stairs-Number of Steps: 14 Bathroom Shower/Tub: Associate Professor: Yes How Accessible: Accessible via walker Home Adaptive Equipment: None Prior Function Level of Independence: Independent Able to Take Stairs?: Yes Driving: Yes         Vision/Perception     Cognition  Cognition Overall Cognitive Status: Appears within functional limits for tasks assessed/performed    Extremity/Trunk Assessment Right Upper Extremity Assessment RUE ROM/Strength/Tone: [facility] for tasks assessed Left Upper Extremity Assessment LUE ROM/Strength/Tone: Within functional levels Right Lower Extremity Assessment RLE ROM/Strength/Tone: Within functional levels Left Lower Extremity Assessment LLE ROM/Strength/Tone: Within functional levels Trunk Assessment Trunk Assessment: Normal     Mobility Bed Mobility Bed Mobility: Rolling Left;Left Sidelying to Sit Rolling Left: 5: Supervision Left Sidelying to Sit: 5: Supervision;HOB elevated Transfers Transfers: Sit to Stand;Stand to Sit Sit to Stand: 7: Independent Stand to Sit: 7: Independent     Exercise     Balance  WFl   End of Session OT - End of Session Activity Tolerance: Patient tolerated treatment well Patient left: in chair;with call bell/phone within reach;with family/visitor present Nurse Communication: Mobility status;Other (comment) (ready for D/C )  GO     Dallen Bunte,HILLARY 10/03/2012, 4:31 PM [facility], OTR/L  [phone] 10/03/2012

## 2012-10-05 ENCOUNTER — Encounter (HOSPITAL_COMMUNITY): Payer: Self-pay | Admitting: Orthopaedic Surgery

## 2012-10-20 NOTE — Discharge Summary (Signed)
Physician Discharge Summary  Patient ID: Shannon Spears MRN: [id number] DOB/AGE: 12/02/1980 32 y.o.  Admit date: 10/02/2012 Discharge date: 10/03/2012  Admission Diagnoses:  HNP (herniated nucleus pulposus), cervical C4-5 and C5-6  Discharge Diagnoses:  Principal Problem:   HNP (herniated nucleus pulposus), cervical C4-5 and C5-6  Past Medical History  Diagnosis Date  . GERD (gastroesophageal reflux disease)   . Headache   . Bipolar 1 disorder, manic, mild     Surgeries: Procedure(s): C4-5, C5-6 anterior cervical discectomy and fusion, allograft plate on 09/04/8655   Consultants (if any):    Discharged Condition: Improved  Hospital Course: Shannon Spears is an 32 y.o. female who was admitted 10/02/2012 with a diagnosis of HNP (herniated nucleus pulposus), cervical and went to the operating room on 10/02/2012 and underwent the above named procedures.    She was given perioperative antibiotics:  Anti-infectives   Start     Dose/Rate Route Frequency Ordered Stop   10/02/12 1800  ceFAZolin (ANCEF) IVPB 1 g/50 mL premix     1 g 100 mL/hr over 30 Minutes Intravenous Every 8 hours 10/02/12 1456 10/03/12 0236   10/02/12 1011  ceFAZolin (ANCEF) 2-3 GM-% IVPB SOLR    Comments:  JAMES, KAREN: cabinet override      10/02/12 1011 10/02/12 2214   10/02/12 1000  ceFAZolin (ANCEF) IVPB 2 g/50 mL premix     2 g 100 mL/hr over 30 Minutes Intravenous  Once 10/02/12 0950 10/02/12 1015   09/25/12 0600  ceFAZolin (ANCEF) IVPB 2 g/50 mL premix     2 g 100 mL/hr over 30 Minutes Intravenous On call to O.R. 09/24/12 1358 09/26/12 0559    .  She was given sequential compression devices, early ambulation for DVT prophylaxis.  She benefited maximally from the hospital stay and there were no complications.    Recent vital signs:  Filed Vitals:   10/03/12 1338  BP: 123/77  Pulse: 78  Temp: 98.4 F (36.9 C)  Resp: 18    Recent laboratory studies:  Lab Results  Component Value Date   HGB  13.8 09/23/2012   Lab Results  Component Value Date   WBC 12.3* 09/23/2012   PLT 420* 09/23/2012   No results found for this basename: INR   Lab Results  Component Value Date   NA 142 09/23/2012   K 3.9 09/23/2012   CL 107 09/23/2012   CO2 26 09/23/2012   BUN 10 09/23/2012   CREATININE 0.67 09/23/2012   GLUCOSE 83 09/23/2012    Discharge Medications:     Medication List    TAKE these medications       methocarbamol 500 MG tablet  Commonly known as:  ROBAXIN  Take 1 tablet (500 mg total) by mouth every 6 (six) hours as needed (spasm).     omeprazole 20 MG capsule  Commonly known as:  PRILOSEC  Take 20 mg by mouth daily.     oxyCODONE-acetaminophen 5-325 MG per tablet  Commonly known as:  ROXICET  Take 1-2 tablets by mouth every 4 (four) hours as needed for pain.     prenatal multivitamin Tabs  Take 1 tablet by mouth daily.     traMADol 50 MG tablet  Commonly known as:  ULTRAM  Take 50 mg by mouth 2 (two) times daily as needed for pain.        Diagnostic Studies: Dg Chest 2 View  09/23/2012  *RADIOLOGY REPORT*  Clinical Data: Preop, cough.  CHEST - 2 VIEW  Comparison:  None.  Findings: Trachea is midline.  Heart size normal.  Lungs are clear. No pleural fluid.  IMPRESSION: Negative.   Original Report Authenticated By: Leanna Battles, M.D.    Dg Cervical Spine 2-3 Views  10/02/2012  *RADIOLOGY REPORT*  Clinical Data: C4-5, C5-6 ACDF.  DG C-ARM 1-60 MIN,CERVICAL SPINE - 2-3 VIEW  Comparison: 09/23/2012  Findings: Two intraoperative spot images demonstrate changes of anterior fusion from C4-C6.  No hardware or bony complicating feature.  Normal alignment.  IMPRESSION: C4-C6 ACDF.   Original Report Authenticated By: Charlett Nose, M.D.    X-ray Cervical Spine Ap And Lateral  09/23/2012  *RADIOLOGY REPORT*  Clinical Data: 32 year old female preoperative study for cervical spine surgery.  CERVICAL SPINE - 2-3 VIEW  Comparison: [facility] cervical MRI  09/12/12.  Findings: Normal prevertebral soft tissue contour.  Stable mild reversal of cervical lordosis. Cervicothoracic junction alignment is within normal limits.  Negative lung apices.  IMPRESSION: No acute osseous abnormality in the cervical spine.   Original Report Authenticated By: Erskine Speed, M.D.    Dg C-arm 1-60 Min  10/02/2012  *RADIOLOGY REPORT*  Clinical Data: C4-5, C5-6 ACDF.  DG C-ARM 1-60 MIN,CERVICAL SPINE - 2-3 VIEW  Comparison: 09/23/2012  Findings: Two intraoperative spot images demonstrate changes of anterior fusion from C4-C6.  No hardware or bony complicating feature.  Normal alignment.  IMPRESSION: C4-C6 ACDF.   Original Report Authenticated By: Charlett Nose, M.D.     Disposition: 01-Home or Self Care      Discharge Orders   Future Orders Complete By Expires     Call MD / Call 911  As directed     Comments:      If you experience chest pain or shortness of breath, CALL 911 and be transported to the hospital emergency room.  If you develope a fever above 101 F, pus (white drainage) or increased drainage or redness at the wound, or calf pain, call your surgeon's office.    Constipation Prevention  As directed     Comments:      Drink plenty of fluids.  Prune juice may be helpful.  You may use a stool softener, such as Colace (over the counter) 100 mg twice a day.  Use MiraLax (over the counter) for constipation as needed.    Diet - low sodium heart healthy  As directed     Discharge instructions  As directed     Comments:      Keep on neck brace    Increase activity slowly as tolerated  As directed      No lifting greater than 10 lbs. No overhead use of arms. Avoid bending,and twisting neck. Walk in house for first week them may start to get out slowly increasing distance up to one mile by 3 weeks post op. Keep incision dry.wear collar at all times Call if any fevers >101, chills, or increasing numbness or weakness or increased swelling or drainage.   Follow-up  Information   Follow up with Eldred Manges, MD. Schedule an appointment as soon as possible for a visit in 2 weeks.   Contact information:   [address] [place] [address] [place] [address] [phone]        Signed: Wende Neighbors 10/20/2012, 4:38 PM

## 2015-09-13 DIAGNOSIS — D75839 Thrombocytosis, unspecified: Secondary | ICD-10-CM | POA: Insufficient documentation

## 2015-10-09 DIAGNOSIS — M797 Fibromyalgia: Secondary | ICD-10-CM | POA: Insufficient documentation

## 2015-10-09 DIAGNOSIS — E7800 Pure hypercholesterolemia, unspecified: Secondary | ICD-10-CM | POA: Insufficient documentation

## 2016-06-21 DIAGNOSIS — R0689 Other abnormalities of breathing: Secondary | ICD-10-CM | POA: Insufficient documentation

## 2016-06-21 DIAGNOSIS — J45901 Unspecified asthma with (acute) exacerbation: Secondary | ICD-10-CM | POA: Insufficient documentation

## 2016-06-21 DIAGNOSIS — Z720 Tobacco use: Secondary | ICD-10-CM | POA: Insufficient documentation

## 2016-11-11 ENCOUNTER — Encounter (INDEPENDENT_AMBULATORY_CARE_PROVIDER_SITE_OTHER): Payer: Self-pay | Admitting: Physician Assistant

## 2016-11-11 ENCOUNTER — Ambulatory Visit (INDEPENDENT_AMBULATORY_CARE_PROVIDER_SITE_OTHER): Payer: Self-pay

## 2016-11-11 ENCOUNTER — Ambulatory Visit (INDEPENDENT_AMBULATORY_CARE_PROVIDER_SITE_OTHER): Payer: BLUE CROSS/BLUE SHIELD | Admitting: Physician Assistant

## 2016-11-11 DIAGNOSIS — M542 Cervicalgia: Secondary | ICD-10-CM

## 2016-11-11 DIAGNOSIS — E663 Overweight: Secondary | ICD-10-CM | POA: Insufficient documentation

## 2016-11-11 MED ORDER — LIDOCAINE 5 % EX PTCH: 1.0000 | MEDICATED_PATCH | CUTANEOUS | 1 refills | Status: DC

## 2016-11-11 MED ORDER — METHOCARBAMOL 500 MG PO TABS: 500.0000 mg | ORAL_TABLET | Freq: Four times a day (QID) | ORAL | 1 refills | Status: DC | PRN

## 2016-11-11 NOTE — Progress Notes (Signed)
Office Visit Note   Patient: Shannon Spears           Date of Birth: [birth year]           MRN: [id number] Visit Date: 11/11/2016              Requested by: No referring provider defined for this encounter. PCP: Bosie Clos, MD   Assessment & Plan: Visit Diagnoses:  1. Neck pain     Plan: We'll have her undergo an MRI of her some spine rule out HNP. She'll follow with Dr. Ophelia Charter after this to go over results and discuss further treatment.  Follow-Up Instructions: No Follow-up on file.   Orders:  Orders Placed This Encounter  Procedures  . XR Cervical Spine 2 or 3 views  . MR Cervical Spine w/o contrast   Meds ordered this encounter  Medications  . lidocaine (LIDODERM) 5 %    Sig: Place 1 patch onto the skin daily.    Dispense:  30 patch    Refill:  1  . methocarbamol (ROBAXIN) 500 MG tablet    Sig: Take 1 tablet (500 mg total) by mouth every 6 (six) hours as needed (spasm).    Dispense:  40 tablet    Refill:  1      Procedures: No procedures performed   Clinical Data: No additional findings.   Subjective: Chief Complaint  Patient presents with  . Neck - Pain    Patient present today with left side neck pain with sharp, shooting radicular left shoulder and arm pain. This has been an ongoing problem but has gotten much worse now keeping her awake at night. She reports having numbness and tingling in left arm and left hand. She previously had surgery by Dr Ophelia Charter in February 2014C4-5, C5-6 ACDF with allograft plating. She states she recently had a CT scan done at [facility] but we are unable to access these images.   She was given a prescription for Skelaxin which she is not taking she is currently taking baclofen. She states she also has a prescription for Flexeril which she has not filled. She feels that the Flexeril causes her to be extremely sleepy. He was also given a prescription for Medrol Dosepak which she did not take due to the fact that she  states it makes her very irritable and she is unable to get along with anyone Had no new injury to her neck. States she is unable to take gabapentin, NSAIDs or prednisone. Does have bipolar 1 disorder, manic mild. Past medical history otherwise positive for gastric reflux, headaches.  Review of Systems No Fevers ,chills ,shortness breath. Positive for neck pain with radicular symptoms left arm.  Objective: Vital Signs: There were no vitals taken for this visit.  Physical Exam  Constitutional: She is oriented to person, place, and time. She appears well-developed and well-nourished. No distress.  Pulmonary/Chest: Effort normal.  Neurological: She is alert and oriented to person, place, and time.  Skin: She is not diaphoretic.  Psychiatric: She has a normal mood and affect. Her behavior is normal.    Ortho Exam recall spine she has pain with flexion and extension with of the cervical spine and slight limitations especially with extension secondary to pain. Spurling's test is equivocal secondary to pain. Out of 5 strength throughout the upper extremities against resistance except for the left triceps which is 4 out of 5 strength. Full motor bilateral hands. Generative decreased sensation of the left hand involving  the thumb index middle and ring fingers. Tendon reflexes are 2+ at the biceps and brachial radialis and 1+ at the triceps and equal and symmetric throughout. Tenderness along the medial border of the left scapula. Specialty Comments:  No specialty comments available.  Imaging: Xr Cervical Spine 2 Or 3 Views  Result Date: 11/11/2016 Cervical Spine AP lateral views: Acute fractures. Status post C4 3 mL 6 fusion anteriorly. There is no hardware failure. Otherwise disc space are well maintained. No spondylo-listhesis.    PMFS History: Patient Active Problem List   Diagnosis Date Noted  . HNP (herniated nucleus pulposus), cervical 10/02/2012    Class: Diagnosis of   Past Medical  History:  Diagnosis Date  . Bipolar 1 disorder, manic, mild (HCC)   . GERD (gastroesophageal reflux disease)   . Headache(784.0)     No family history on file.  Past Surgical History:  Procedure Laterality Date  . ANTERIOR CERVICAL DECOMP/DISCECTOMY FUSION N/A 10/02/2012   Procedure: C4-5, C5-6 anterior cervical discectomy and fusion, allograft plate;  Surgeon: Eldred Manges, MD;  Location: [facility] OR;  Service: Orthopedics;  Laterality: N/A;  . CYSTOGRAPHY     Social History   Occupational History  . Not on file.   Social History Main Topics  . Smoking status: Current Every Day Smoker    Packs/day: 0.50    Types: Cigarettes  . Smokeless tobacco: Not on file  . Alcohol use Yes     Comment: rarely  . Drug use: No  . Sexual activity: Not on file

## 2016-11-12 ENCOUNTER — Telehealth (INDEPENDENT_AMBULATORY_CARE_PROVIDER_SITE_OTHER): Payer: Self-pay | Admitting: Physician Assistant

## 2016-11-12 NOTE — Telephone Encounter (Signed)
Patient aware of the below message  

## 2016-11-12 NOTE — Telephone Encounter (Signed)
Please advise 

## 2016-11-12 NOTE — Telephone Encounter (Signed)
Patient called advised there is a weird feeling in the back of her throat. Patient said she is having a problem swallowing and need to know what to do in meantime.  The number to contact patient is [phone]

## 2016-11-12 NOTE — Telephone Encounter (Signed)
She can see her PCP

## 2016-11-13 ENCOUNTER — Encounter (HOSPITAL_COMMUNITY): Payer: Self-pay

## 2016-11-13 DIAGNOSIS — Z79899 Other long term (current) drug therapy: Secondary | ICD-10-CM | POA: Insufficient documentation

## 2016-11-13 DIAGNOSIS — F17210 Nicotine dependence, cigarettes, uncomplicated: Secondary | ICD-10-CM | POA: Diagnosis not present

## 2016-11-13 DIAGNOSIS — R1310 Dysphagia, unspecified: Secondary | ICD-10-CM | POA: Diagnosis present

## 2016-11-13 NOTE — ED Triage Notes (Signed)
Pt states that she had neck surgery four years ago, went to see orthopedics on Monday and since feels pressure in the back of her neck and head, and feels it is heard to swallow, pt is told that she needs a MRI but is not able to get one until April 23.

## 2016-11-14 ENCOUNTER — Emergency Department (HOSPITAL_COMMUNITY)
Admission: EM | Admit: 2016-11-14 | Discharge: 2016-11-14 | Disposition: A | Discharge status: Home / Self Care | Payer: BLUE CROSS/BLUE SHIELD | Attending: Emergency Medicine | Admitting: Emergency Medicine

## 2016-11-14 ENCOUNTER — Emergency Department (HOSPITAL_COMMUNITY): Payer: BLUE CROSS/BLUE SHIELD

## 2016-11-14 ENCOUNTER — Encounter (HOSPITAL_COMMUNITY): Payer: Self-pay | Admitting: Radiology

## 2016-11-14 DIAGNOSIS — R1310 Dysphagia, unspecified: Secondary | ICD-10-CM

## 2016-11-14 LAB — CBC
HCT: 40.1 % (ref 36.0–46.0)
HEMOGLOBIN: 13.5 g/dL (ref 12.0–15.0)
MCH: 30.8 pg (ref 26.0–34.0)
MCHC: 33.7 g/dL (ref 30.0–36.0)
MCV: 91.6 fL (ref 78.0–100.0)
PLATELETS: 455 10*3/uL — AB (ref 150–400)
RBC: 4.38 MIL/uL (ref 3.87–5.11)
RDW: 13.6 % (ref 11.5–15.5)
WBC: 9.1 10*3/uL (ref 4.0–10.5)

## 2016-11-14 LAB — BASIC METABOLIC PANEL
Anion gap: 13 (ref 5–15)
BUN: 7 mg/dL (ref 6–20)
CHLORIDE: 102 mmol/L (ref 101–111)
CO2: 25 mmol/L (ref 22–32)
Calcium: 9.4 mg/dL (ref 8.9–10.3)
Creatinine, Ser: 0.75 mg/dL (ref 0.44–1.00)
GFR calc Af Amer: >60 mL/min (ref >60)
GLUCOSE: 104 mg/dL — AB (ref 65–99)
POTASSIUM: 3.3 mmol/L — AB (ref 3.5–5.1)
Sodium: 140 mmol/L (ref 135–145)

## 2016-11-14 MED ORDER — TRAMADOL HCL 50 MG PO TABS
50.0000 mg | ORAL_TABLET | Freq: Once | ORAL | Status: AC
  Administered 2016-11-14: 50 mg via ORAL
  Filled 2016-11-14: qty 1

## 2016-11-14 MED ORDER — IOPAMIDOL (ISOVUE-300) INJECTION 61%
INTRAVENOUS | Status: AC
  Administered 2016-11-14: 75 mL
  Filled 2016-11-14: qty 75

## 2016-11-14 NOTE — ED Provider Notes (Signed)
[facility]-EMERGENCY DEPT Provider Note   CSN: [id number] Arrival date & time: 11/13/16  2205     History   Chief Complaint Chief Complaint  Patient presents with  . Neck Pain    HPI Shannon Spears is a 36 y.o. female.  36 year old female with a history of bipolar 1 disorder and esophageal reflux presents to the emergency department for evaluation of dysphagia. Patient states that she has had issues with cervical radiculopathy, but began to feel as though there was something pushing on her posterior esophagus making it difficult for her to swallow. She believes that there is something obstructing her esophagus in her neck. She has had aggravation of the symptoms especially when eating solid foods. She has been able to tolerate secretions and drink water without difficulty. The patient has remained compliant with her anti-inflammatories and slow relaxer prescribed to her by her orthopedist. She has not had any fevers. No inability to swallow or drooling. No trauma. She has been off of her Valium lately to prevent excessive sedation while taking her other muscle relaxer.   The history is provided by the patient. No language interpreter was used.    Past Medical History:  Diagnosis Date  . Bipolar 1 disorder, manic, mild (HCC)   . GERD (gastroesophageal reflux disease)   . HQIONGEX(528.4)     Patient Active Problem List   Diagnosis Date Noted  . HNP (herniated nucleus pulposus), cervical 10/02/2012    Class: Diagnosis of    Past Surgical History:  Procedure Laterality Date  . ANTERIOR CERVICAL DECOMP/DISCECTOMY FUSION N/A 10/02/2012   Procedure: C4-5, C5-6 anterior cervical discectomy and fusion, allograft plate;  Surgeon: Eldred Manges, MD;  Location: [facility] OR;  Service: Orthopedics;  Laterality: N/A;  . CYSTOGRAPHY      OB History    No data available       Home Medications    Prior to Admission medications   Medication Sig Start Date End Date Taking? Authorizing Provider    albuterol (PROVENTIL HFA;VENTOLIN HFA) 108 (90 Base) MCG/ACT inhaler Inhale 1-2 puffs into the lungs every 6 (six) hours as needed for wheezing or shortness of breath.  08/03/09  Yes Historical Provider, MD  butalbital-acetaminophen-caffeine (FIORICET, ESGIC) 50-325-40 MG tablet Take 1-2 tablets by mouth every 6 (six) hours as needed for headache or migraine.  10/31/09  Yes Historical Provider, MD  butorphanol (STADOL) 10 MG/ML nasal spray Place 1 spray into the nose every 4 (four) hours as needed for headache or migraine.  08/27/09  Yes Historical Provider, MD  cyclobenzaprine (FLEXERIL) 10 MG tablet Take 10 mg by mouth 3 (three) times daily as needed for muscle spasms.  06/13/09  Yes Historical Provider, MD  diazepam (VALIUM) 10 MG tablet Take 10 mg by mouth every 12 (twelve) hours as needed for anxiety.  06/13/09  Yes Historical Provider, MD  fexofenadine (ALLEGRA) 180 MG tablet Take 180 mg by mouth daily as needed for allergies.  10/18/08  Yes Historical Provider, MD  ibuprofen (ADVIL,MOTRIN) 200 MG tablet Take 200-800 mg by mouth every 6 (six) hours as needed for moderate pain.  11/12/07  Yes Historical Provider, MD  iloperidone (FANAPT) 4 MG TABS tablet Take 4 mg by mouth daily.   Yes Historical Provider, MD  lamoTRIgine (LAMICTAL) 200 MG tablet Take 200 mg by mouth daily.  11/12/07  Yes Historical Provider, MD  lidocaine (LIDODERM) 5 % Place 1 patch onto the skin daily. Patient taking differently: Place 1 patch onto the skin daily as  needed (pain).  11/11/16  Yes Kirtland Bouchard, PA-C  methocarbamol (ROBAXIN) 500 MG tablet Take 1 tablet (500 mg total) by mouth every 6 (six) hours as needed (spasm). 11/11/16  Yes Kirtland Bouchard, PA-C  methylphenidate (RITALIN) 10 MG tablet Take 10 mg by mouth 3 (three) times daily with meals.   Yes Historical Provider, MD  ranitidine (ZANTAC) 150 MG tablet Take 150 mg by mouth at bedtime.   Yes Historical Provider, MD    Family History No family history on  file.  Social History Social History  Substance Use Topics  . Smoking status: Current Every Day Smoker    Packs/day: 0.50    Types: Cigarettes  . Smokeless tobacco: Never Used  . Alcohol use Yes     Comment: rarely     Allergies   Butrans [buprenorphine]; Codeine; Penicillins; Tetanus toxoids; Vicodin [hydrocodone-acetaminophen]; and Doxycycline   Review of Systems Review of Systems Ten systems reviewed and are negative for acute change, except as noted in the HPI.    Physical Exam Updated Vital Signs BP (!) 107/54   Pulse 78   Temp 98 F (36.7 C) (Oral)   Resp 20   Ht  (1.549 m)   Wt 72.6 kg   SpO2 94%   BMI 30.23 kg/m   Physical Exam  Constitutional: She is oriented to person, place, and time. She appears well-developed and well-nourished. No distress.  Nontoxic and in no acute distress  HENT:  Head: Normocephalic and atraumatic.  Patient tolerating secretions without difficulty. No angioedema. No tripoding. Uvula midline. Oropharynx clear. No foreign bodies visualized.  Eyes: Conjunctivae and EOM are normal. No scleral icterus.  Neck: Normal range of motion.  No meningismus. No stridor.  Pulmonary/Chest: Effort normal. No respiratory distress. She has no wheezes. She has no rales.  Respirations even and unlabored. Lungs clear to auscultation bilaterally.  Musculoskeletal: Normal range of motion.  Neurological: She is alert and oriented to person, place, and time.  Sensation to light touch intact in bilateral upper extremities. Grip strength 5/5 bilaterally.  Skin: Skin is warm and dry. No rash noted. She is not diaphoretic. No erythema. No pallor.  Psychiatric: Her behavior is normal.  Mildly anxious appearing.  Nursing note and vitals reviewed.    ED Treatments / Results  Labs (all labs ordered are listed, but only abnormal results are displayed) Labs Reviewed  CBC - Abnormal; Notable for the following:       Result Value   Platelets 455 (*)     All other components within normal limits  BASIC METABOLIC PANEL - Abnormal; Notable for the following:    Potassium 3.3 (*)    Glucose, Bld 104 (*)    All other components within normal limits    EKG  EKG Interpretation None       Radiology Ct Soft Tissue Neck W Contrast  Result Date: 11/14/2016 CLINICAL DATA:  Dysphasia for 2 days EXAM: CT NECK WITH CONTRAST TECHNIQUE: Multidetector CT imaging of the neck was performed using the standard protocol following the bolus administration of intravenous contrast. CONTRAST:  75mL ISOVUE-300 IOPAMIDOL (ISOVUE-300) INJECTION 61% COMPARISON:  Cervical spine CT 10/31/2016 FINDINGS: Pharynx and larynx: The nasopharynx is clear. The oropharynx and hypopharynx are normal. The epiglottis, supraglottic larynx, glottis and subglottic larynx are normal. No retropharyngeal collection. The parapharyngeal spaces are preserved. The visible oral cavity and base of tongue are normal. Salivary glands: The parotid and submandibular glands are normal. No sialolithiasis or salivary ductal  dilatation. Thyroid: Normal Lymph nodes: No enlarged or abnormal density lymph nodes. Vascular: Normal major cervical vessels. Limited intracranial: Normal Visualized orbits: Normal Mastoids and visualized paranasal sinuses: Clear Skeleton: Anterior cervical fusion at C4-C6. Upper chest: Clear. Other: None. IMPRESSION: 1. No acute abnormality of the soft tissues of the neck. 2. No specific finding to explain the reported dysphagia. Cervical osteophytes or anterior fusion hardware may at times be a source of dysphagia, though this is by no means certain. Electronically Signed   By: Deatra Robinson M.D.   On: 11/14/2016 06:48    Procedures Procedures (including critical care time)  Medications Ordered in ED Medications  traMADol (ULTRAM) tablet 50 mg (not administered)  iopamidol (ISOVUE-300) 61 % injection (75 mLs  Contrast Given 11/14/16 0611)     Initial Impression / Assessment and  Plan / ED Course  I have reviewed the triage vital signs and the nursing notes.  Pertinent labs & imaging results that were available during my care of the patient were reviewed by me and considered in my medical decision making (see chart for details).     36 year old female presents to the emergency department dysphagia characterized by a sensation of a mass applying pressure to the patient's posterior esophagus. Oropharynx clear. Patient without meningismus. She is tolerating her secretions today. No tripoding or stridor. No angioedema. Patient also noted to be neurovascularly intact.  CT scan was performed which shows no evidence of mass or other foreign body impeding the patient's ability to swallow. Question globus hystericus. I do not believe further emergent workup is indicated, especially in light of patient's stability given reported chronicity of symptoms. Outpatient referral provided and primary care follow-up advised. Return precautions given at discharge. Patient discharged in satisfactory condition; VSS.   Final Clinical Impressions(s) / ED Diagnoses   Final diagnoses:  Dysphagia, unspecified type    New Prescriptions Discharge Medication List as of 11/14/2016  7:08 AM       Antony Madura, PA-C 11/20/16 0414    Layla Maw Ward, DO 11/20/16 1610

## 2016-11-14 NOTE — Discharge Instructions (Signed)
Believe that your symptoms are due to Globus Hystericum from stopping or Valium for anxiety. Your CT scan does not show any difficulty with your airway or esophagus. We recommend he follow-up with your primary care doctor if your symptoms persist. Continue with your scheduled outpatient MRI in 1.5 weeks.

## 2016-11-25 ENCOUNTER — Ambulatory Visit
Admission: RE | Admit: 2016-11-25 | Discharge: 2016-11-25 | Disposition: A | Discharge status: Home / Self Care | Payer: BLUE CROSS/BLUE SHIELD | Source: Ambulatory Visit | Attending: Physician Assistant | Admitting: Physician Assistant

## 2016-11-25 DIAGNOSIS — M542 Cervicalgia: Secondary | ICD-10-CM

## 2016-12-04 ENCOUNTER — Ambulatory Visit (INDEPENDENT_AMBULATORY_CARE_PROVIDER_SITE_OTHER): Payer: BLUE CROSS/BLUE SHIELD | Admitting: Orthopaedic Surgery

## 2016-12-04 ENCOUNTER — Encounter (INDEPENDENT_AMBULATORY_CARE_PROVIDER_SITE_OTHER): Payer: Self-pay | Admitting: Orthopaedic Surgery

## 2016-12-04 VITALS — BP 121/80 | HR 116 | Ht 61.0 in | Wt 160.0 lb

## 2016-12-04 DIAGNOSIS — M5020 Other cervical disc displacement, unspecified cervical region: Secondary | ICD-10-CM | POA: Diagnosis not present

## 2016-12-04 MED ORDER — CYCLOBENZAPRINE HCL 10 MG PO TABS: 10.0000 mg | ORAL_TABLET | Freq: Three times a day (TID) | ORAL | 0 refills | Status: DC | PRN

## 2016-12-08 NOTE — Progress Notes (Signed)
Office Visit Note   Patient: Shannon Spears           Date of Birth: [birth year]           MRN: [id number] Visit Date: 12/04/2016              Requested by: Bosie Clos, MD [address] [place], [place] [address] PCP: Bosie Clos, MD   Assessment & Plan: Visit Diagnoses:  1. HNP (herniated nucleus pulposus), cervical         C6-7, left foraminal.  Plan: Reviewed with patient MRI scan which shows a foraminal disc below solid fusion and this disc herniations at C6-7 on the left. She's actually gotten a little bit better and we'll recheck her in a month. Flexeril prescribed which is helped her in the past. Recheck 1 month. She is having increased symptoms we'll proceed with the consideration for surgery.  Follow-Up Instructions: Return in about 1 month (around 01/04/2017).   Orders:  No orders of the defined types were placed in this encounter.  Meds ordered this encounter  Medications  . cyclobenzaprine (FLEXERIL) 10 MG tablet    Sig: Take 1 tablet (10 mg total) by mouth 3 (three) times daily as needed for muscle spasms.    Dispense:  30 tablet    Refill:  0      Procedures: No procedures performed   Clinical Data: No additional findings.   Subjective: Chief Complaint  Patient presents with  . Neck - Pain    HPI patient returns so cervical MRI she states she's gotten a little bit better. She still has pain at the base of her skull sometimes it feels like it's pinching between the shoulder blades. Pain in her left arm is gotten better. Still has some numbness in her hand. The more active she is she has increased numbness. 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 Previous cervical fusion C4-5 C5-6 in February 2016. No lower extremity weakness. She is to use some Skelaxin, Flexeril. Patient is bipolar disorder and is not able take anti-inflammatories or prednisone.  Review of  Systems 14 point review of systems updated positive for gas or reflux history of headaches. Bipolar disorder. GI disturbances with anti-inflammatories.  Objective: Vital Signs: BP 121/80   Pulse (!) 116   Ht 5\' 1"  (1.549 m)   Wt 160 lb (72.6 kg)   BMI 30.23 kg/m   Physical Exam  Constitutional: She is oriented to person, place, and time. She appears well-developed.  HENT:  Head: Normocephalic.  Right Ear: External ear normal.  Left Ear: External ear normal.  Eyes: Pupils are equal, round, and reactive to light.  Neck: No tracheal deviation present. No thyromegaly present.  Cardiovascular: Normal rate.   Pulmonary/Chest: Effort normal.  Abdominal: Soft.  Neurological: She is alert and oriented to person, place, and time.  Skin: Skin is warm and dry.  Psychiatric: She has a normal mood and affect. Her behavior is normal.    Ortho Exam patient some pain with flexion-extension cervical spine. Positive Spurling on the left negative on the right. Biceps, brachioradialis reflexes are 2+ right and left. Triceps and the left is 1+ and 2+ on the opposite right side. Tender along the medial superior border of the scapula. Normal lower extremity gait no isolated weakness lower extremities.  Specialty Comments:  No specialty comments available.  Imaging: Show images for MR Cervical  Spine w/o contrast  Study Result   CLINICAL DATA:  Neck pain with left upper extremity radicular symptoms  EXAM: MRI CERVICAL SPINE WITHOUT CONTRAST  TECHNIQUE: Multiplanar, multisequence MR imaging of the cervical spine was performed. No intravenous contrast was administered.  COMPARISON:  Cervical spine CT 10/31/2016  Cervical spine MRI 04/01/2013  FINDINGS: Alignment: Normal  Vertebrae: There is anterior cervical fusion hardware at C4-C6  Cord: Normal caliber and signal  Posterior Fossa, vertebral arteries, paraspinal tissues: Visualized posterior fossa is normal. Vertebral artery  flow voids are preserved. Normal visualized paraspinal soft tissues.  Disc levels:  C1-C2: Normal.  C2-C3: Normal disc space and facets. No spinal canal or neuroforaminal stenosis.  C3-C4: Normal disc space and facets. No spinal canal or neuroforaminal stenosis.  C4-C5: Postfusion changes without stenosis.  C5-C6: Postfusion changes with left foraminal osteophyte causing mild foraminal stenosis.  C6-C7: There is a left foraminal disc protrusion that causes severe left neural foraminal stenosis.  C7-T1: Normal disc space and facets. No spinal canal or neuroforaminal stenosis.  IMPRESSION: 1. The likely symptomatic level is C6-C7, where a left foraminal disc protrusion severely narrows the left neural foramen. 2. Postfusion changes at C4-C6 without spinal canal stenosis.   Electronically Signed   By: Deatra RobinsonKevin  Herman M.D.   On: 11/26/2016 03:13       PMFS History: Patient Active Problem List   Diagnosis Date Noted  . HNP (herniated nucleus pulposus), cervical 10/02/2012    Class: Diagnosis of   Past Medical History:  Diagnosis Date  . Bipolar 1 disorder, manic, mild (HCC)   . GERD (gastroesophageal reflux disease)   . Headache(784.0)     No family history on file.  Past Surgical History:  Procedure Laterality Date  . ANTERIOR CERVICAL DECOMP/DISCECTOMY FUSION N/A 10/02/2012   Procedure: C4-5, C5-6 anterior cervical discectomy and fusion, allograft plate;  Surgeon: Eldred MangesMark C Conard Alvira, MD;  Location: [facility] OR;  Service: Orthopedics;  Laterality: N/A;  . CYSTOGRAPHY     Social History   Occupational History  . Not on file.   Social History Main Topics  . Smoking status: Current Every Day Smoker    Packs/day: 0.50    Types: Cigarettes  . Smokeless tobacco: Never Used  . Alcohol use Yes     Comment: rarely  . Drug use: No  . Sexual activity: Not on file

## 2016-12-24 ENCOUNTER — Ambulatory Visit (INDEPENDENT_AMBULATORY_CARE_PROVIDER_SITE_OTHER): Payer: BLUE CROSS/BLUE SHIELD | Admitting: Orthopaedic Surgery

## 2017-01-08 ENCOUNTER — Ambulatory Visit (INDEPENDENT_AMBULATORY_CARE_PROVIDER_SITE_OTHER): Payer: Self-pay

## 2017-01-08 ENCOUNTER — Encounter (INDEPENDENT_AMBULATORY_CARE_PROVIDER_SITE_OTHER): Payer: Self-pay | Admitting: Orthopaedic Surgery

## 2017-01-08 ENCOUNTER — Ambulatory Visit (INDEPENDENT_AMBULATORY_CARE_PROVIDER_SITE_OTHER): Payer: BLUE CROSS/BLUE SHIELD | Admitting: Orthopaedic Surgery

## 2017-01-08 VITALS — Ht 61.0 in | Wt 160.0 lb

## 2017-01-08 DIAGNOSIS — M25571 Pain in right ankle and joints of right foot: Secondary | ICD-10-CM

## 2017-01-08 DIAGNOSIS — M79631 Pain in right forearm: Secondary | ICD-10-CM | POA: Diagnosis not present

## 2017-01-08 DIAGNOSIS — R0781 Pleurodynia: Secondary | ICD-10-CM | POA: Diagnosis not present

## 2017-01-08 MED ORDER — TRAMADOL HCL 50 MG PO TABS: 50.0000 mg | ORAL_TABLET | Freq: Two times a day (BID) | ORAL | 0 refills | Status: DC | PRN

## 2017-01-08 NOTE — Progress Notes (Signed)
Office Visit Note   Patient: Shannon Spears           Date of Birth: 28-Mar-1981           MRN: [name] Visit Date: 01/08/2017              Requested by: Bosie Closice, Kathleen M, MD [address] [place], [place] [address] PCP: Bosie Closice, Kathleen M, MD   Assessment & Plan: Visit Diagnoses:  1. Right forearm pain   2. Rib pain on right side   3. Pain in right ankle and joints of right foot     Plan: Right lateral ankle sprain patient was put in a lace up brace. Wear this over the next 1-2 weeks. Can use ice off and on. For contusions of right forearm and right ribs this should gradually improve over the next several days. Patient requested narcotic pain medication. She is already taking Valium and Flexeril. Given prescription for tramadol. Follow up in 3 weeks for recheck. Return sooner if needed.  Follow-Up Instructions: Return in about 3 weeks (around 01/29/2017).   Orders:  Orders Placed This Encounter  Procedures  . XR Ankle Complete Right  . XR Forearm Right  . XR Ribs Unilateral Right   Meds ordered this encounter  Medications  . traMADol (ULTRAM) 50 MG tablet    Sig: Take 1 tablet (50 mg total) by mouth every 12 (twelve) hours as needed.    Dispense:  30 tablet    Refill:  0      Procedures: No procedures performed   Clinical Data: No additional findings.   Subjective: Chief Complaint  Patient presents with  . Neck - Follow-up  . Right Arm - Pain  . Right Leg - Pain    HPI Patient comes in today with complaints of right sided body pain. States that yesterday she was walking in her home kind of quickly when she slipped on a rug hitting the door frame and landing also with a hard impact on her right side. She is complaining of right trapezius and scapular discomfort. Also complaining of right sided rib/chest pain. No shortness of breath. Pain in the right forearm and also has some bruising. Right knee is sore but not too bad. Right ankle pain with ambulation. Not  really any swelling or bruising there. Patient has a history of left C6-7 HNP and was originally scheduled to come in today to discuss how she was doing from that. States that left arm pain has been improved but feels like she continues to have some left arm weakness. States that she is using Valium and Flexeril but not taking at the same time. Review of Systems  Constitutional: Positive for activity change.  HENT: Negative.   Respiratory: Negative.   Gastrointestinal: Negative.   Genitourinary: Negative.   Musculoskeletal: Negative.   Neurological: Positive for weakness.     Objective: Vital Signs: Ht 5\' 1"  (1.549 m)   Wt 160 lb (72.6 kg)   BMI 30.23 kg/m   Physical Exam  Constitutional: She is oriented to person, place, and time. She appears well-nourished. No distress.  HENT:  Head: Normocephalic and atraumatic.  Eyes: EOM are normal.  Neck:  Right greater than left trapezius tenderness. Some limitation in neck range of motion due to stiffness.  Pulmonary/Chest: No respiratory distress.  Musculoskeletal: She exhibits no deformity.  Bilateral shoulders good range of motion. Negative drop arm. Negative impingement test. Bilateral elbows good range of motion. Right forearm she does have  some proximal tenderness that extends down to the distal radius. She does have slight bruising and swelling. Right wrist good range of motion. Does have some soreness with movement. Right ankle tender over the lateral malleolus and ATFL. No bruising. No obvious swelling.  Neurological: She is alert and oriented to person, place, and time.  Skin: Skin is warm and dry.    Ortho Exam  Specialty Comments:  No specialty comments available.  Imaging: Xr Ribs Unilateral Right  Result Date: 01/08/2017 ) Films do not show any signs of rib fracture. Reviewed with Dr. Ophelia Charter.  Xr Ankle Complete Right  Result Date: 01/08/2017 X-ray right ankle shows good bony anatomy. No signs of fracture.  Xr Forearm  Right  Result Date: 01/08/2017 X-ray right forearm shows good bony anatomy. No signs of fracture. Reviewed with Dr. Ophelia Charter.    PMFS History: Patient Active Problem List   Diagnosis Date Noted  . HNP (herniated nucleus pulposus), cervical 10/02/2012    Class: Diagnosis of   Past Medical History:  Diagnosis Date  . Bipolar 1 disorder, manic, mild (HCC)   . GERD (gastroesophageal reflux disease)   . Headache(784.0)     No family history on file.  Past Surgical History:  Procedure Laterality Date  . ANTERIOR CERVICAL DECOMP/DISCECTOMY FUSION N/A 10/02/2012   Procedure: C4-5, C5-6 anterior cervical discectomy and fusion, allograft plate;  Surgeon: Eldred Manges, MD;  Location: [facility] OR;  Service: Orthopedics;  Laterality: N/A;  . CYSTOGRAPHY     Social History   Occupational History  . Not on file.   Social History Main Topics  . Smoking status: Current Every Day Smoker    Packs/day: 0.50    Types: Cigarettes  . Smokeless tobacco: Never Used  . Alcohol use Yes     Comment: rarely  . Drug use: No  . Sexual activity: Not on file

## 2017-01-28 ENCOUNTER — Ambulatory Visit (INDEPENDENT_AMBULATORY_CARE_PROVIDER_SITE_OTHER): Payer: BLUE CROSS/BLUE SHIELD | Admitting: Orthopaedic Surgery

## 2017-01-28 ENCOUNTER — Encounter (INDEPENDENT_AMBULATORY_CARE_PROVIDER_SITE_OTHER): Payer: Self-pay | Admitting: Orthopaedic Surgery

## 2017-01-28 DIAGNOSIS — M25511 Pain in right shoulder: Secondary | ICD-10-CM | POA: Diagnosis not present

## 2017-01-28 DIAGNOSIS — M5020 Other cervical disc displacement, unspecified cervical region: Secondary | ICD-10-CM

## 2017-01-28 NOTE — Progress Notes (Signed)
Office Visit Note   Patient: Shannon Spears           Date of Birth: 08-15-1980           MRN: [id number] Visit Date: 01/28/2017              Requested by: Bosie Closice, Kathleen M, MD [address] [place], [place] [address] PCP: Bosie Closice, Kathleen M, MD   Assessment & Plan: Visit Diagnoses:  1. Right shoulder pain, unspecified chronicity   2. HNP (herniated nucleus pulposus), cervical     Plan: Patient's MRI shows foraminal disc protrusion at C6-7. This has progressed since 2014 when she had her previous C4-5, C5-6 procedure which has healed solidly. We will check her back in one month. If she continues to have symptoms we will consider a treatment of C6-7 foraminal protrusion.  Follow-Up Instructions: Return in about 1 month (around 02/27/2017).   Orders:  Orders Placed This Encounter  Procedures  . Ambulatory referral to Physical Therapy   No orders of the defined types were placed in this encounter.     Procedures: No procedures performed   Clinical Data: No additional findings.   Subjective: Chief Complaint  Patient presents with  . Neck - Pain  . Right Shoulder - Pain  . Left Shoulder - Pain    HPI patient had episode where she fell on 01/07/2017 landing on the wooden floor with the pain in her right shoulder region she's had some pain when she reaches up overhead she states this is flared up her fibromyalgia. She takes in tramadol didn't seem to really work she still uses her Valium which she takes regularly and has been on for years.  Review of Systems previous two-level cervical fusion with satisfactory healing. Patient reports fibromyalgia. 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 Patient complains of also left-sided neck pain after the fall.   Objective: Vital Signs: There were no vitals taken for this visit.  Physical Exam  Constitutional: She is oriented to person,  place, and time. She appears well-developed.  HENT:  Head: Normocephalic.  Right Ear: External ear normal.  Left Ear: External ear normal.  Eyes: Pupils are equal, round, and reactive to light.  Neck: No tracheal deviation present. No thyromegaly present.  Cardiovascular: Normal rate.   Pulmonary/Chest: Effort normal.  Abdominal: Soft.  Musculoskeletal:  Well-healed anterior cervical incision. She has some brachial plexus tenderness on the left. No tenderness long head biceps. Negative shoulder impingement on the left. No interosseous atrophy no form atrophy. Normal heel-to-toe gait. No clonus.  Neurological: She is alert and oriented to person, place, and time.  Skin: Skin is warm and dry.  Psychiatric: She has a normal mood and affect. Her behavior is normal.    Ortho Exam  Specialty Comments:  No specialty comments available.  Imaging: Study Result   CLINICAL DATA:  Neck pain with left upper extremity radicular symptoms  EXAM: MRI CERVICAL SPINE WITHOUT CONTRAST  TECHNIQUE: Multiplanar, multisequence MR imaging of the cervical spine was performed. No intravenous contrast was administered.  COMPARISON:  Cervical spine CT 10/31/2016  Cervical spine MRI 04/01/2013  FINDINGS: Alignment: Normal  Vertebrae: There is anterior cervical fusion hardware at C4-C6  Cord: Normal caliber and signal  Posterior Fossa, vertebral arteries, paraspinal tissues: Visualized posterior fossa is normal. Vertebral artery flow voids are preserved. Normal visualized paraspinal soft tissues.  Disc levels:  C1-C2: Normal.  C2-C3: Normal disc space and facets. No spinal canal or neuroforaminal stenosis.  C3-C4: Normal disc space and facets. No spinal canal or neuroforaminal stenosis.  C4-C5: Postfusion changes without stenosis.  C5-C6: Postfusion changes with left foraminal osteophyte causing mild foraminal stenosis.  C6-C7: There is a left foraminal disc  protrusion that causes severe left neural foraminal stenosis.  C7-T1: Normal disc space and facets. No spinal canal or neuroforaminal stenosis.  IMPRESSION: 1. The likely symptomatic level is C6-C7, where a left foraminal disc protrusion severely narrows the left neural foramen. 2. Postfusion changes at C4-C6 without spinal canal stenosis.   Electronically Signed   By: Deatra Robinson M.D.   On: 11/26/2016 03:13       PMFS History: Patient Active Problem List   Diagnosis Date Noted  . HNP (herniated nucleus pulposus), cervical 10/02/2012    Class: Diagnosis of   Past Medical History:  Diagnosis Date  . Bipolar 1 disorder, manic, mild (HCC)   . GERD (gastroesophageal reflux disease)   . Headache(784.0)     No family history on file.  Past Surgical History:  Procedure Laterality Date  . ANTERIOR CERVICAL DECOMP/DISCECTOMY FUSION N/A 10/02/2012   Procedure: C4-5, C5-6 anterior cervical discectomy and fusion, allograft plate;  Surgeon: Eldred Manges, MD;  Location: [facility] OR;  Service: Orthopedics;  Laterality: N/A;  . CYSTOGRAPHY     Social History   Occupational History  . Not on file.   Social History Main Topics  . Smoking status: Current Every Day Smoker    Packs/day: 0.50    Types: Cigarettes  . Smokeless tobacco: Never Used  . Alcohol use Yes     Comment: rarely  . Drug use: No  . Sexual activity: Not on file

## 2017-02-04 ENCOUNTER — Telehealth (INDEPENDENT_AMBULATORY_CARE_PROVIDER_SITE_OTHER): Payer: Self-pay

## 2017-02-04 NOTE — Telephone Encounter (Signed)
IC LM for patient for her to call and let us know if there was a different facility she would like to be referred to for P.T. [facility] bills as a hospital so patient would be paying roughly $100/visit where as if she was referred to one that bills as specialist would likely only cost her $40/visit. Of all the [facility] only 2 of them bill as specialist which are the [place] and [facility] locations. Need to know if patient would like to be referred to one of these or elsewhere.

## 2017-02-10 ENCOUNTER — Telehealth (INDEPENDENT_AMBULATORY_CARE_PROVIDER_SITE_OTHER): Payer: Self-pay | Admitting: *Deleted

## 2017-02-10 NOTE — Telephone Encounter (Signed)
Pt called about physical therapy referral, she stated where she was referred charges as a hospital visit and she needs one that charges as a specialist visit.

## 2017-02-11 ENCOUNTER — Telehealth (INDEPENDENT_AMBULATORY_CARE_PROVIDER_SITE_OTHER): Payer: Self-pay | Admitting: Radiology

## 2017-02-11 NOTE — Telephone Encounter (Signed)
Pt called about physical therapy referral, she stated where she was referred charges as a hospital visit and she needs one that charges as a specialist visit.  Patient states that she called several times and would like a call back.

## 2017-02-12 NOTE — Telephone Encounter (Signed)
Ucall. Ask her where she wants to go to PT and then fill out form and stamp my name and fax it . Thanks. I read note seems like it is a billing /insurance problem with her insurance.

## 2017-02-12 NOTE — Telephone Encounter (Signed)
Can you talk to Dr. Ophelia CharterYates about this one?

## 2017-02-12 NOTE — Telephone Encounter (Signed)
Called patient no answer. LMOM. She need to let us know which PT place she would like to go so that we can fax referral.

## 2017-02-13 NOTE — Telephone Encounter (Signed)
Called patient back and she states she lives in [place] and would like to go to [place]. [facility] [place].

## 2017-02-26 ENCOUNTER — Encounter (INDEPENDENT_AMBULATORY_CARE_PROVIDER_SITE_OTHER): Payer: Self-pay | Admitting: Orthopaedic Surgery

## 2017-02-26 ENCOUNTER — Ambulatory Visit (INDEPENDENT_AMBULATORY_CARE_PROVIDER_SITE_OTHER): Payer: BLUE CROSS/BLUE SHIELD | Admitting: Orthopaedic Surgery

## 2017-02-26 VITALS — BP 107/73 | HR 94 | Ht 61.0 in | Wt 160.0 lb

## 2017-02-26 DIAGNOSIS — M5020 Other cervical disc displacement, unspecified cervical region: Secondary | ICD-10-CM

## 2017-02-26 DIAGNOSIS — M2550 Pain in unspecified joint: Secondary | ICD-10-CM

## 2017-02-26 LAB — CBC WITH DIFFERENTIAL/PLATELET
Basophils Absolute: 0 cells/uL (ref 0–200)
Basophils Relative: 0 %
Eosinophils Absolute: 204 cells/uL (ref 15–500)
Eosinophils Relative: 2 %
HCT: 42.7 % (ref 35.0–45.0)
HEMOGLOBIN: 14.3 g/dL (ref 11.7–15.5)
Lymphocytes Relative: 33 %
Lymphs Abs: 3366 cells/uL (ref 850–3900)
MCH: 32.3 pg (ref 27.0–33.0)
MCHC: 33.5 g/dL (ref 32.0–36.0)
MCV: 96.4 fL (ref 80.0–100.0)
MONOS PCT: 5 %
MPV: 8.7 fL (ref 7.5–12.5)
Monocytes Absolute: 510 cells/uL (ref 200–950)
NEUTROS ABS: 6120 {cells}/uL (ref 1500–7800)
Neutrophils Relative %: 60 %
Platelets: 523 10*3/uL — ABNORMAL HIGH (ref 140–400)
RBC: 4.43 MIL/uL (ref 3.80–5.10)
RDW: 15.9 % — ABNORMAL HIGH (ref 11.0–15.0)
WBC: 10.2 10*3/uL (ref 3.8–10.8)

## 2017-02-26 NOTE — Progress Notes (Signed)
Office Visit Note   Patient: Shannon Spears           Date of Birth: [birth year]           MRN: [id number] Visit Date: 02/26/2017              Requested by: Bosie Closice, Kathleen M, MD [address] [place], [place] [address] PCP: Bosie Closice, Kathleen M, MD   Assessment & Plan: Visit Diagnoses:  1. HNP (herniated nucleus pulposus), cervical   2. Polyarthralgia     Plan: With patient's ongoing neck, bilateral shoulder pain and also with complaints today of polyarthralgias I had blood work drawn today to check CBC and arthritis panel. Patient will continue formal PT for her cervical spine. I again did offer scheduling of cervical ESI's for diagnostic and therapeutic purposes but again patient declined. States that she does not want to consider surgery. follow-up with Dr. Ophelia CharterYates in about 3 weeks for recheck.  Follow-Up Instructions: Return in about 3 weeks (around 03/19/2017) for Review of blood work and recheck for neck pain.   Orders:  No orders of the defined types were placed in this encounter.  No orders of the defined types were placed in this encounter.     Procedures: No procedures performed   Clinical Data: No additional findings.   Subjective: Chief Complaint  Patient presents with  . Neck - Follow-up, Pain    HPI Patient returns for evaluation of neck pain and upper extremity radiculopathy. States that neck pain is unchanged. Continues also have shoulder pain that is aggravated with movement as well. Patient states that she has a history of fibromyalgia and further questioning is also been having pain in bilateral hips, knees and hands. No previous diagnosis of inflammatory arthritis and does not think she's had workup for this. Review of Systems No current cardiac pulmonary GI GU issues.  Objective: Vital Signs: BP 107/73   Pulse 94   Ht 5\' 1"  (1.549 m)   Wt 160 lb (72.6 kg)   BMI 30.23 kg/m   Physical Exam  Constitutional: She is oriented to person, place, and time.  No distress.  HENT:  Head: Normocephalic and atraumatic.  Eyes: Pupils are equal, round, and reactive to light. EOM are normal.  Pulmonary/Chest: No respiratory distress.  Abdominal: She exhibits no distension.  Musculoskeletal:  Service spine she has good range of motion but with some discomfort. Bilateral brachial plexus trapezius tenderness. Both shoulders good range of motion but also with discomfort. Pain with impingement testing. Shoulders are tender to palpation.  Negative drop arm. Some pain with supraspinatus resistance. Bilateral elbows good range motion. Both elbows are tender. Bilateral wrist joints are tender palpation. Good range of motion. Negative logroll bilateral hips. Tender over the bilateral hip greater trochanter bursa. Bilateral knee joint lines are tender. Bilateral calves nontender.  Neurological: She is alert and oriented to person, place, and time.  Skin: Skin is warm and dry.  Psychiatric: She has a normal mood and affect.    Ortho Exam  Specialty Comments:  No specialty comments available.  Imaging: No results found.   PMFS History: Patient Active Problem List   Diagnosis Date Noted  . HNP (herniated nucleus pulposus), cervical 10/02/2012    Class: Diagnosis of   Past Medical History:  Diagnosis Date  . Bipolar 1 disorder, manic, mild (HCC)   . GERD (gastroesophageal reflux disease)   . Headache(784.0)     No family history on file.  Past Surgical History:  Procedure Laterality Date  . ANTERIOR CERVICAL DECOMP/DISCECTOMY FUSION N/A 10/02/2012   Procedure: C4-5, C5-6 anterior cervical discectomy and fusion, allograft plate;  Surgeon: Eldred MangesMark C Yates, MD;  Location: [facility] OR;  Service: Orthopedics;  Laterality: N/A;  . CYSTOGRAPHY     Social History   Occupational History  . Not on file.   Social History Main Topics  . Smoking status: Current Every Day Smoker    Packs/day: 0.50    Types: Cigarettes  . Smokeless tobacco: Never Used  . Alcohol use  Yes     Comment: rarely  . Drug use: No  . Sexual activity: Not on file

## 2017-02-26 NOTE — Addendum Note (Signed)
Addended by: Rogers SeedsYEATTS, Keyairra Kolinski M on: 02/26/2017 12:00 PM   Modules accepted: Orders

## 2017-02-27 LAB — RHEUMATOID FACTOR

## 2017-02-27 LAB — URIC ACID: Uric Acid, Serum: 4.6 mg/dL (ref 2.5–7.0)

## 2017-02-27 LAB — SEDIMENTATION RATE: Sed Rate: 30 mm/hr — ABNORMAL HIGH (ref 0–20)

## 2017-02-28 LAB — ANA: Anti Nuclear Antibody(ANA): NEGATIVE

## 2017-03-21 ENCOUNTER — Encounter (INDEPENDENT_AMBULATORY_CARE_PROVIDER_SITE_OTHER): Payer: Self-pay | Admitting: Orthopaedic Surgery

## 2017-03-21 ENCOUNTER — Ambulatory Visit (INDEPENDENT_AMBULATORY_CARE_PROVIDER_SITE_OTHER): Payer: BLUE CROSS/BLUE SHIELD | Admitting: Orthopaedic Surgery

## 2017-03-21 VITALS — BP 115/80 | HR 107 | Ht 61.0 in | Wt 160.0 lb

## 2017-03-21 DIAGNOSIS — M5020 Other cervical disc displacement, unspecified cervical region: Secondary | ICD-10-CM | POA: Diagnosis not present
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
## 2017-06-16 DIAGNOSIS — K5900 Constipation, unspecified: Secondary | ICD-10-CM | POA: Insufficient documentation

## 2017-07-10 DIAGNOSIS — A4902 Methicillin resistant Staphylococcus aureus infection, unspecified site: Secondary | ICD-10-CM | POA: Insufficient documentation

## 2017-07-10 DIAGNOSIS — F419 Anxiety disorder, unspecified: Secondary | ICD-10-CM | POA: Insufficient documentation

## 2017-07-10 DIAGNOSIS — K589 Irritable bowel syndrome without diarrhea: Secondary | ICD-10-CM | POA: Insufficient documentation

## 2017-07-10 DIAGNOSIS — N763 Subacute and chronic vulvitis: Secondary | ICD-10-CM | POA: Insufficient documentation

## 2017-07-10 DIAGNOSIS — G43909 Migraine, unspecified, not intractable, without status migrainosus: Secondary | ICD-10-CM | POA: Insufficient documentation

## 2017-07-22 ENCOUNTER — Encounter (INDEPENDENT_AMBULATORY_CARE_PROVIDER_SITE_OTHER): Payer: Self-pay | Admitting: Orthopaedic Surgery

## 2017-07-22 ENCOUNTER — Ambulatory Visit (INDEPENDENT_AMBULATORY_CARE_PROVIDER_SITE_OTHER): Payer: BLUE CROSS/BLUE SHIELD | Admitting: Orthopaedic Surgery

## 2017-07-22 VITALS — BP 124/85 | HR 95 | Ht 61.0 in | Wt 160.0 lb

## 2017-07-22 DIAGNOSIS — M5020 Other cervical disc displacement, unspecified cervical region: Secondary | ICD-10-CM

## 2017-07-22 NOTE — Progress Notes (Signed)
Office Visit Note   Patient: Shannon HeinzJessica Poppell           Date of Birth: 1981-05-26           MRN: [id number] Visit Date: 07/22/2017              Requested by: Bosie Closice, Kathleen M, MD [address] [place], [place] [address] PCP: Bosie Closice, Kathleen M, MD   Assessment & Plan: Visit Diagnoses:  1. HNP (herniated nucleus pulposus), cervical     Plan: We will restart her physical therapy for cervical spine.  Large left C6-7 foraminal disc protrusion causing severe left foraminal stenosis.  Recheck 4 months.  If her symptoms get worse she will return.  Follow-Up Instructions: Return in about 4 months (around 11/20/2017).   Orders:  No orders of the defined types were placed in this encounter.  No orders of the defined types were placed in this encounter.     Procedures: No procedures performed   Clinical Data: No additional findings.   Subjective: Chief Complaint  Patient presents with  . Neck - Pain, Follow-up    HPI 7353-month follow-up for cervical spondylosis.  He was going to physical therapy which were helping her neck and arm symptoms.  States she has had a bout with pancreatitis for the last 2 months therapy.  Disc protrusion at C6-7 below her fusion which is from C4 level down to C6.  States her neck has not been that bad  Review of Systems to level cervical fusion 2014.  Patient states she has fibromyalgia.  Previous GYN surgery.  Recent  Pancreatitis.  Otherwise negative 14 pt ROS   Objective: Vital Signs: BP 124/85   Pulse 95   Ht 5\' 1"  (1.549 m)   Wt 160 lb (72.6 kg)   BMI 30.23 kg/m   Physical Exam  Constitutional: She is oriented to person, place, and time. She appears well-developed.  HENT:  Head: Normocephalic.  Right Ear: External ear normal.  Left Ear: External ear normal.  Eyes: Pupils are equal, round, and reactive to light.  Neck: No tracheal deviation present. No thyromegaly present.  Cardiovascular: Normal rate.  Pulmonary/Chest: Effort normal.    Abdominal: Soft.  Neurological: She is alert and oriented to person, place, and time.  Skin: Skin is warm and dry.  Psychiatric: She has a normal mood and affect. Her behavior is normal.    Ortho Exam well-healed anterior incision.  No pain with compression.  Upper extremity reflexes are 1+ and symmetrical no lower extremity weakness no clonus.  Biceps triceps brachioradialis are strong.  Positive Spurling on the left and left brachial plexus tenderness.  No tenderness on the right side.  Specialty Comments:  No specialty comments available.  Imaging: No results found.   PMFS History: Patient Active Problem List   Diagnosis Date Noted  . HNP (herniated nucleus pulposus), cervical 10/02/2012    Class: Diagnosis of   Past Medical History:  Diagnosis Date  . Bipolar 1 disorder, manic, mild (HCC)   . GERD (gastroesophageal reflux disease)   . Headache(784.0)     No family history on file.  Past Surgical History:  Procedure Laterality Date  . ANTERIOR CERVICAL DECOMP/DISCECTOMY FUSION N/A 10/02/2012   Procedure: C4-5, C5-6 anterior cervical discectomy and fusion, allograft plate;  Surgeon: Eldred MangesMark C Syndey Jaskolski, MD;  Location: [facility] OR;  Service: Orthopedics;  Laterality: N/A;  . CYSTOGRAPHY     Social History   Occupational History  . Not on file  Tobacco Use  . Smoking status: Current Every Day Smoker    Packs/day: 0.50    Types: Cigarettes  . Smokeless tobacco: Never Used  Substance and Sexual Activity  . Alcohol use: Yes    Comment: rarely  . Drug use: No  . Sexual activity: Not on file

## 2017-08-03 ENCOUNTER — Encounter (INDEPENDENT_AMBULATORY_CARE_PROVIDER_SITE_OTHER): Payer: Self-pay | Admitting: Orthopaedic Surgery

## 2017-11-25 ENCOUNTER — Ambulatory Visit (INDEPENDENT_AMBULATORY_CARE_PROVIDER_SITE_OTHER): Payer: BLUE CROSS/BLUE SHIELD | Admitting: Orthopaedic Surgery

## 2017-11-25 ENCOUNTER — Encounter (INDEPENDENT_AMBULATORY_CARE_PROVIDER_SITE_OTHER): Payer: Self-pay | Admitting: Orthopaedic Surgery

## 2017-11-25 ENCOUNTER — Ambulatory Visit (INDEPENDENT_AMBULATORY_CARE_PROVIDER_SITE_OTHER): Payer: BLUE CROSS/BLUE SHIELD

## 2017-11-25 ENCOUNTER — Ambulatory Visit (INDEPENDENT_AMBULATORY_CARE_PROVIDER_SITE_OTHER): Payer: Self-pay

## 2017-11-25 VITALS — BP 108/78 | HR 98 | Ht 61.0 in | Wt 165.0 lb

## 2017-11-25 DIAGNOSIS — S161XXA Strain of muscle, fascia and tendon at neck level, initial encounter: Secondary | ICD-10-CM

## 2017-11-25 DIAGNOSIS — M542 Cervicalgia: Secondary | ICD-10-CM | POA: Diagnosis not present

## 2017-11-25 DIAGNOSIS — S39012A Strain of muscle, fascia and tendon of lower back, initial encounter: Secondary | ICD-10-CM | POA: Diagnosis not present

## 2017-11-25 DIAGNOSIS — M545 Low back pain: Secondary | ICD-10-CM | POA: Diagnosis not present

## 2017-11-25 MED ORDER — TRAMADOL HCL 50 MG PO TABS: 50.0000 mg | ORAL_TABLET | Freq: Two times a day (BID) | ORAL | 0 refills | Status: DC | PRN
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
## 2017-12-02 ENCOUNTER — Telehealth (INDEPENDENT_AMBULATORY_CARE_PROVIDER_SITE_OTHER): Payer: Self-pay
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
## 2017-12-08 ENCOUNTER — Encounter (INDEPENDENT_AMBULATORY_CARE_PROVIDER_SITE_OTHER): Payer: Self-pay | Admitting: Orthopaedic Surgery

## 2017-12-08 ENCOUNTER — Ambulatory Visit (INDEPENDENT_AMBULATORY_CARE_PROVIDER_SITE_OTHER): Payer: BLUE CROSS/BLUE SHIELD

## 2017-12-08 ENCOUNTER — Ambulatory Visit (INDEPENDENT_AMBULATORY_CARE_PROVIDER_SITE_OTHER): Payer: BLUE CROSS/BLUE SHIELD | Admitting: Orthopaedic Surgery

## 2017-12-08 VITALS — BP 123/82 | HR 117 | Ht 61.0 in | Wt 165.0 lb

## 2017-12-08 DIAGNOSIS — M5020 Other cervical disc displacement, unspecified cervical region: Secondary | ICD-10-CM | POA: Diagnosis not present

## 2017-12-08 DIAGNOSIS — Z981 Arthrodesis status: Secondary | ICD-10-CM

## 2017-12-08 DIAGNOSIS — M542 Cervicalgia: Secondary | ICD-10-CM

## 2017-12-08 MED ORDER — TRAMADOL HCL 50 MG PO TABS: 50.0000 mg | ORAL_TABLET | Freq: Two times a day (BID) | ORAL | 0 refills | Status: DC | PRN

## 2017-12-08 NOTE — Progress Notes (Signed)
tram

## 2017-12-08 NOTE — Addendum Note (Signed)
Addended by: Rip Harbour on: 12/08/2017 03:14 PM   Modules accepted: Orders
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
## 2017-12-09 ENCOUNTER — Ambulatory Visit (INDEPENDENT_AMBULATORY_CARE_PROVIDER_SITE_OTHER): Payer: BLUE CROSS/BLUE SHIELD | Admitting: Orthopaedic Surgery

## 2018-01-06 DIAGNOSIS — K861 Other chronic pancreatitis: Secondary | ICD-10-CM | POA: Insufficient documentation

## 2018-02-10 DIAGNOSIS — Z9049 Acquired absence of other specified parts of digestive tract: Secondary | ICD-10-CM | POA: Insufficient documentation

## 2018-06-16 ENCOUNTER — Ambulatory Visit: Payer: Self-pay | Admitting: Psychiatry

## 2018-06-16 DIAGNOSIS — F4312 Post-traumatic stress disorder, chronic: Secondary | ICD-10-CM | POA: Insufficient documentation

## 2018-06-16 DIAGNOSIS — F319 Bipolar disorder, unspecified: Secondary | ICD-10-CM | POA: Insufficient documentation

## 2018-06-16 DIAGNOSIS — F451 Undifferentiated somatoform disorder: Secondary | ICD-10-CM | POA: Insufficient documentation

## 2018-06-16 DIAGNOSIS — F1111 Opioid abuse, in remission: Secondary | ICD-10-CM | POA: Insufficient documentation

## 2018-06-16 DIAGNOSIS — F3175 Bipolar disorder, in partial remission, most recent episode depressed: Secondary | ICD-10-CM | POA: Insufficient documentation

## 2018-06-29 ENCOUNTER — Encounter: Payer: Self-pay | Admitting: Emergency Medicine

## 2018-06-29 DIAGNOSIS — F3175 Bipolar disorder, in partial remission, most recent episode depressed: Secondary | ICD-10-CM | POA: Insufficient documentation

## 2018-08-13 ENCOUNTER — Telehealth: Payer: Self-pay | Admitting: Psychiatry

## 2018-08-13 NOTE — Telephone Encounter (Signed)
PA has already been submitted and denied, Dr. Marlyne Beards did an appeal on 08/12/2018 and we are waiting on response. Will get her samples Fanapt 4mg .

## 2018-08-13 NOTE — Telephone Encounter (Signed)
Spoke with pt and let her know about pending appeal for her Fanapt. Samples pulled.

## 2018-08-13 NOTE — Telephone Encounter (Signed)
Shannon Spears states that her Shannon Spears is not filled yet because it requires a PA. I don't know if pharmacy has contacted [place] yet prior to today.Will need PA if not done. If denied, she can't afford it. She is currently out of the medcation, so I don't know if we need to get her some samples. Pharmacy is going to fax over the PA request.

## 2018-08-20 NOTE — Telephone Encounter (Signed)
This is just a test.

## 2018-09-01 ENCOUNTER — Ambulatory Visit: Payer: Self-pay | Admitting: Psychiatry

## 2018-09-08 ENCOUNTER — Encounter: Payer: Self-pay | Admitting: Psychiatry

## 2018-09-08 ENCOUNTER — Ambulatory Visit (INDEPENDENT_AMBULATORY_CARE_PROVIDER_SITE_OTHER): Payer: PRIVATE HEALTH INSURANCE | Admitting: Psychiatry

## 2018-09-08 VITALS — BP 132/80 | HR 78 | Ht 62.0 in | Wt 162.0 lb

## 2018-09-08 DIAGNOSIS — F4312 Post-traumatic stress disorder, chronic: Secondary | ICD-10-CM | POA: Diagnosis not present

## 2018-09-08 DIAGNOSIS — F3175 Bipolar disorder, in partial remission, most recent episode depressed: Secondary | ICD-10-CM

## 2018-09-08 DIAGNOSIS — F1111 Opioid abuse, in remission: Secondary | ICD-10-CM | POA: Diagnosis not present

## 2018-09-08 DIAGNOSIS — F451 Undifferentiated somatoform disorder: Secondary | ICD-10-CM | POA: Diagnosis not present

## 2018-09-08 MED ORDER — LAMOTRIGINE 200 MG PO TABS: 200.0000 mg | ORAL_TABLET | Freq: Every day | ORAL | 5 refills | Status: DC

## 2018-09-08 MED ORDER — DIAZEPAM 10 MG PO TABS: ORAL_TABLET | ORAL | 5 refills | Status: DC
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
## 2018-12-17 IMAGING — CT CT NECK W/ CM
4 of 5 series · 15 of 33 positions shown, 17 images · IV contrast (APPLIED)
Comparison: Cervical spine CT 10/31/2016

CLINICAL DATA: Dysphasia for 2 days

EXAM:
CT NECK WITH CONTRAST
TECHNIQUE: Multidetector CT imaging of the neck was performed using the
standard protocol following the bolus administration of intravenous
contrast.
CONTRAST:  75mL SM2GVF-GMM IOPAMIDOL (SM2GVF-GMM) INJECTION 61%

[Series 3: neck 2.0 i31s 3 · axial · 0.46mm/px · z∈[-191,-57]mm · 4 of 113 slices shown, 5 images]
[im 23/113  soft-tissue]
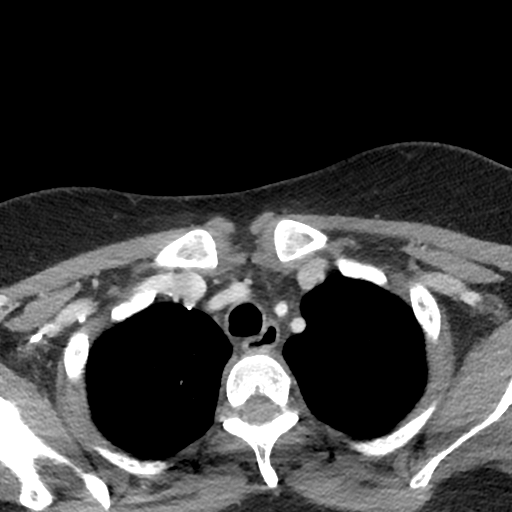
[im 23/113  bone]
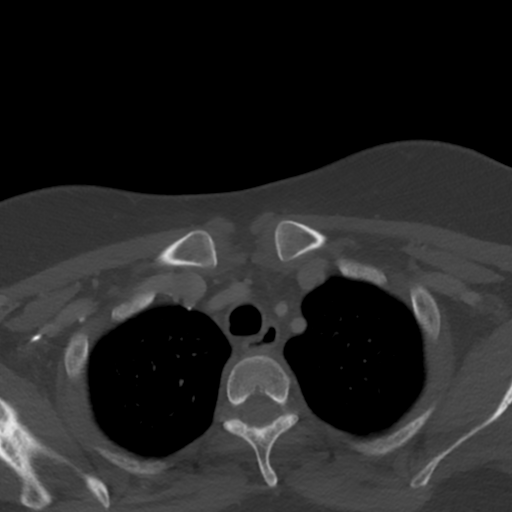
[im 45/113  bone]
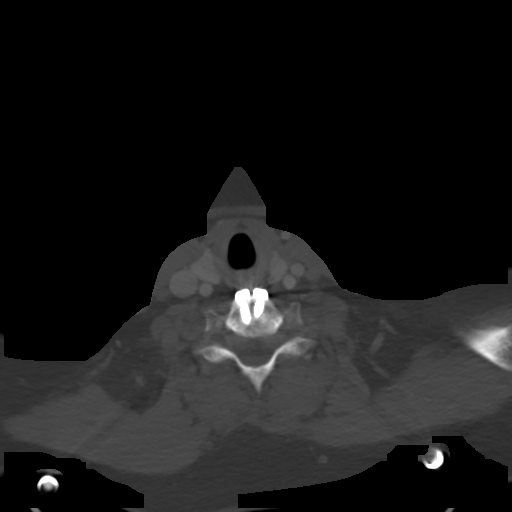
[im 68/113  bone]
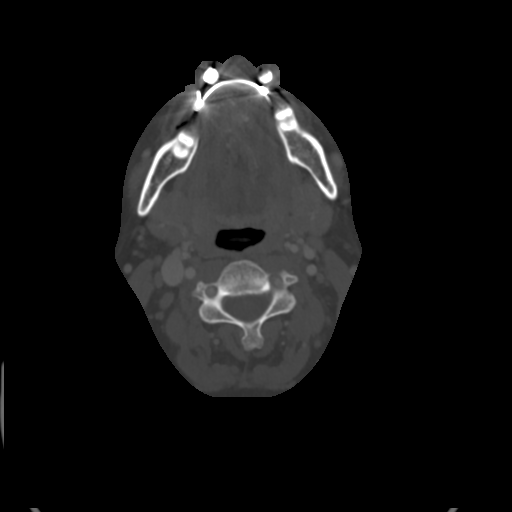
[im 90/113  bone]
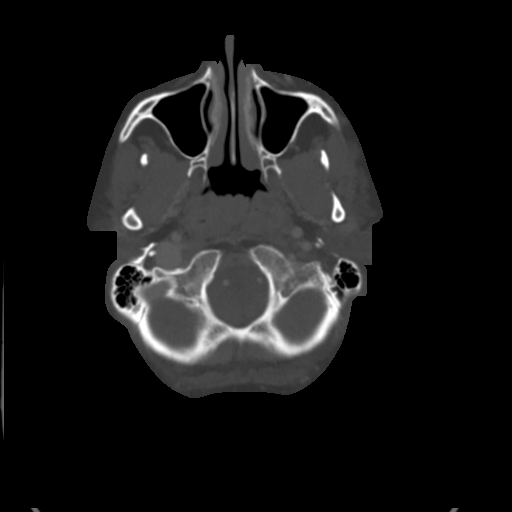

[Series 6: coronal st · coronal · 0.44mm/px · 3 of 105 slices shown]
[im 21/105  bone]
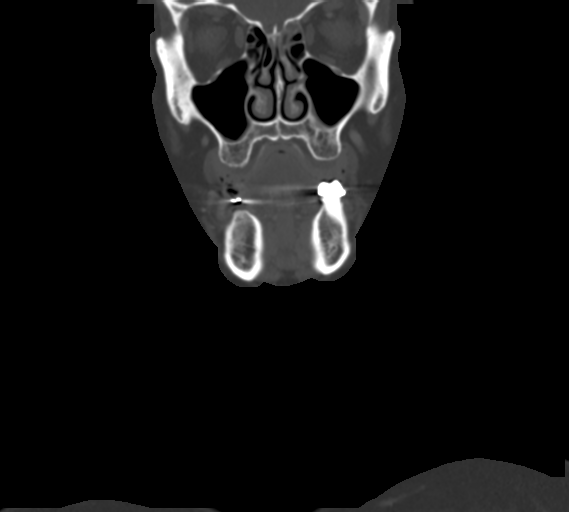
[im 42/105  bone]
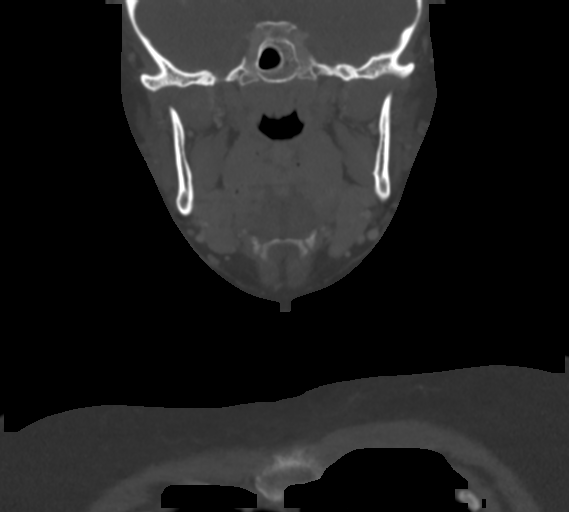
[im 63/105  bone]
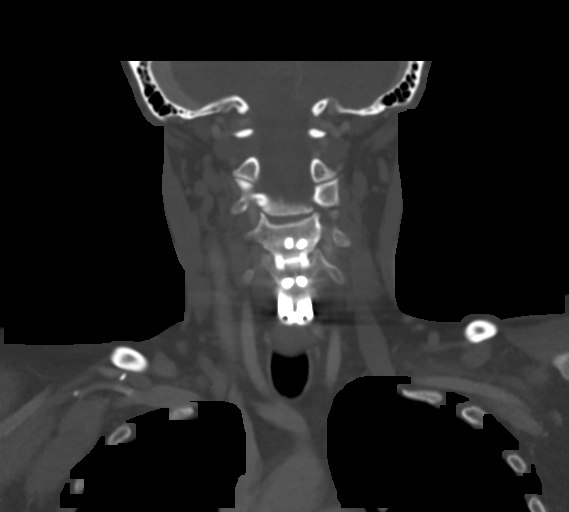

[Series 7: sagittal st · sagittal · 0.44mm/px · 5 of 82 slices shown, 6 images]
[im 28/82  bone]
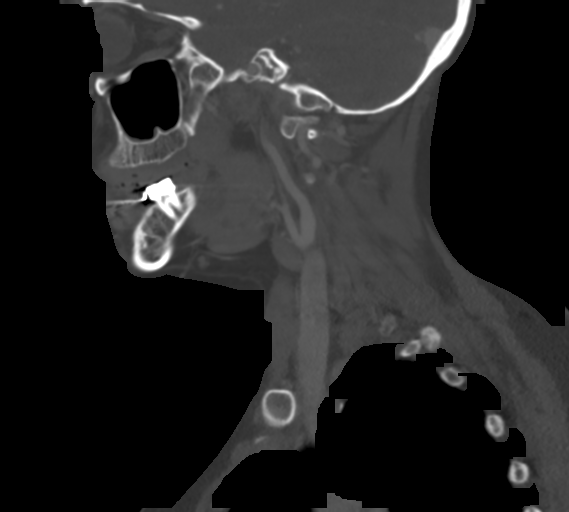
[im 34/82  bone]
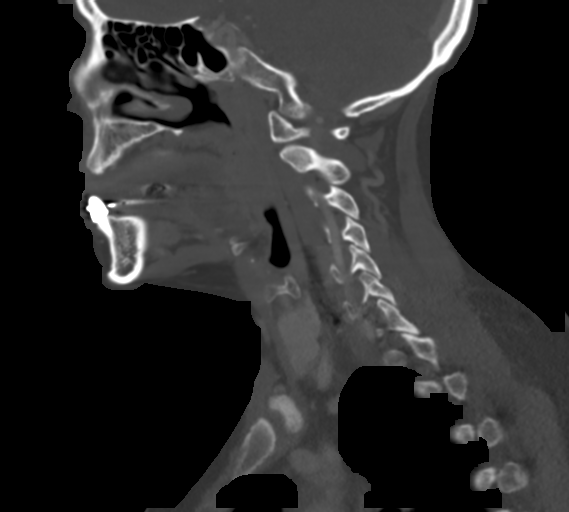
[im 41/82  soft-tissue]
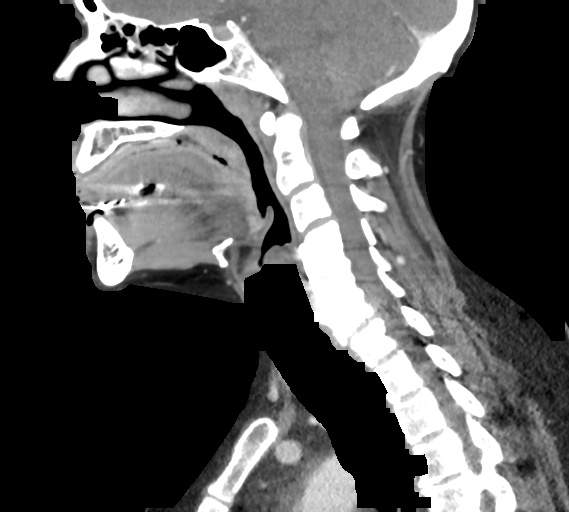
[im 41/82  bone]
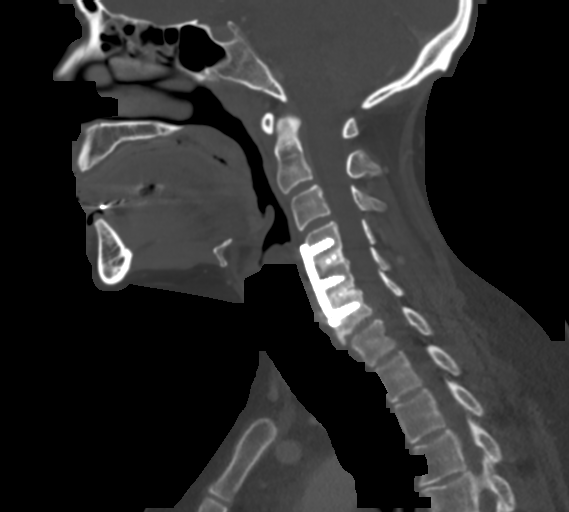
[im 48/82  bone]
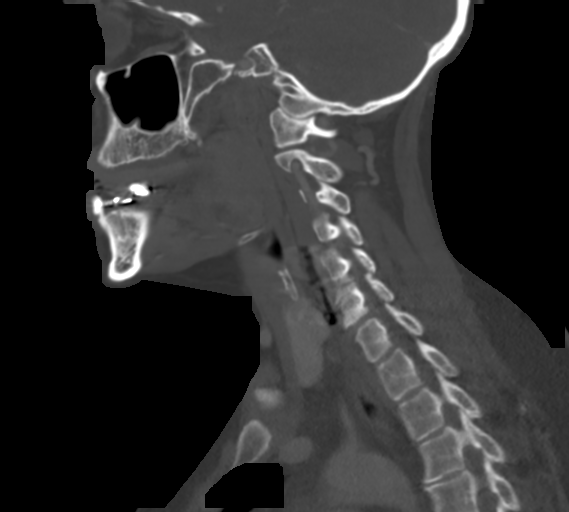
[im 55/82  bone]
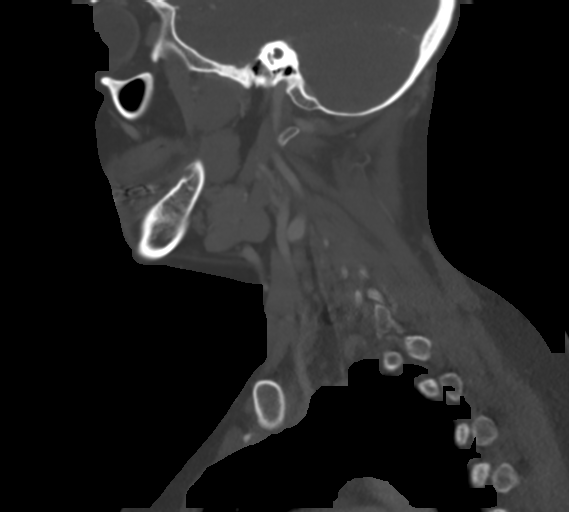

[Series 8: orthogonal st · axial · 0.39mm/px · z∈[-200,-110]mm · 3 of 113 slices shown]
[im 23/113  bone]
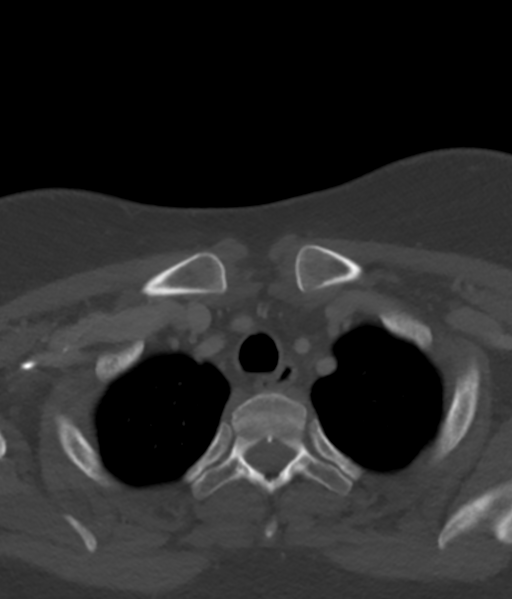
[im 45/113  bone]
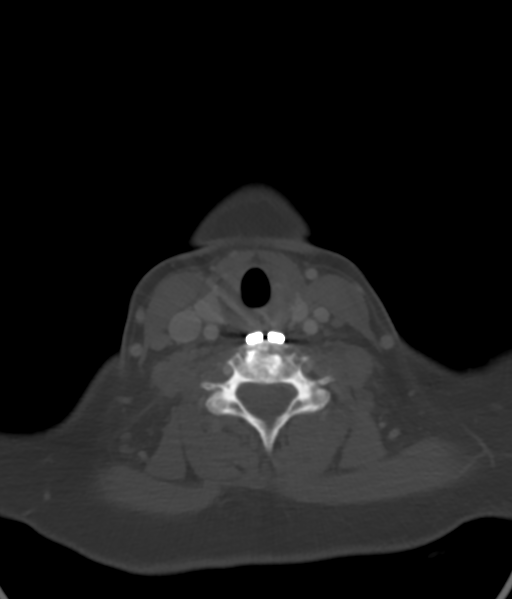
[im 68/113  bone]
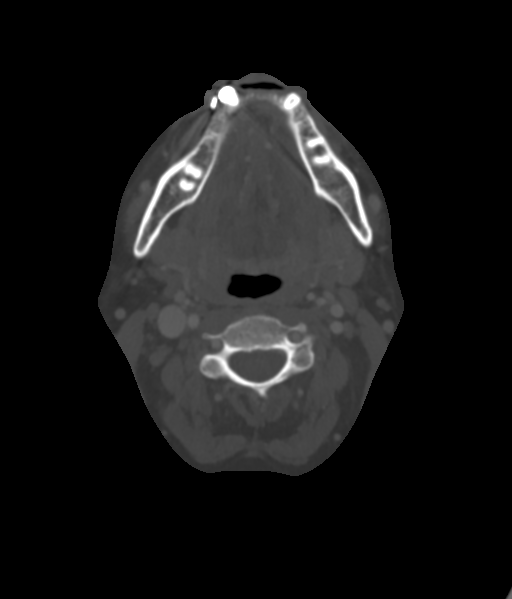

[15 of 33 positions shown; findings below may reference images not displayed]

FINDINGS: Pharynx and larynx: The nasopharynx is clear. The oropharynx and
hypopharynx are normal. The epiglottis, supraglottic larynx, glottis
and subglottic larynx are normal. No retropharyngeal collection. The
parapharyngeal spaces are preserved. The visible oral cavity and
base of tongue are normal.

Salivary glands: The parotid and submandibular glands are normal. No
sialolithiasis or salivary ductal dilatation.

Thyroid: Normal

Lymph nodes: No enlarged or abnormal density lymph nodes.

Vascular: Normal major cervical vessels.

Limited intracranial: Normal

Visualized orbits: Normal

Mastoids and visualized paranasal sinuses: Clear

Skeleton: Anterior cervical fusion at C4-C6.

Upper chest: Clear.

Other: None.
IMPRESSION: 1. No acute abnormality of the soft tissues of the neck.
2. No specific finding to explain the reported dysphagia. Cervical
osteophytes or anterior fusion hardware may at times be a source of
dysphagia, though this is by no means certain.

## 2018-12-23 ENCOUNTER — Encounter: Payer: Self-pay | Admitting: Family Medicine

## 2018-12-24 ENCOUNTER — Encounter: Payer: Self-pay | Admitting: Family Medicine

## 2018-12-24 ENCOUNTER — Other Ambulatory Visit: Payer: Self-pay

## 2018-12-24 ENCOUNTER — Ambulatory Visit (INDEPENDENT_AMBULATORY_CARE_PROVIDER_SITE_OTHER): Payer: PRIVATE HEALTH INSURANCE | Admitting: Family Medicine

## 2018-12-24 DIAGNOSIS — M5442 Lumbago with sciatica, left side: Secondary | ICD-10-CM

## 2018-12-24 DIAGNOSIS — G8929 Other chronic pain: Secondary | ICD-10-CM

## 2018-12-24 DIAGNOSIS — M5441 Lumbago with sciatica, right side: Secondary | ICD-10-CM

## 2018-12-24 MED ORDER — CYCLOBENZAPRINE HCL 10 MG PO TABS: 10.0000 mg | ORAL_TABLET | Freq: Three times a day (TID) | ORAL | 1 refills | Status: DC | PRN

## 2018-12-24 NOTE — Progress Notes (Deleted)
Called patient to initiate their telephone visit with provider Joaquin Courts, FNP-C. Verified date of birth. Patient has c/o lower back pain x 7-8 months. Pain radiates to hips as well. Takes extra strength Tylenol with some relief. KWalker, CMA.

## 2018-12-24 NOTE — Progress Notes (Signed)
Virtual Visit via Telephone Note  I connected with Shannon Spears on 12/24/18 at  3:10 PM EDT by telephone and verified that I am speaking with the correct person using two identifiers.  Location: Patient: Located at home during today's encounter  Provider: Located at home during today's encounter    I discussed the limitations, risks, security and privacy concerns of performing an evaluation and management service by telephone and the availability of in person appointments. I also discussed with the patient that there may be a patient responsible charge related to this service. The patient expressed understanding and agreed to proceed.   History of Present Illness:  Shannon Spears has HNP (herniated nucleus pulposus), cervical; S/P cervical spinal fusion; Protrusion of cervical intervertebral disc; Bipolar I disorder, current or most recent episode depressed, in partial remission with mixed features (HCC); Chronic post-traumatic stress disorder; Somatic symptom disorder; and Opioid use disorder, mild, in sustained remission (HCC) on their problem list.  Chronic Back Pain   Back Pain: Patient presents for evaluation of low back problems.  Symptoms have been present for 7 months and include burning and aching lumbosacral pain, bilateral leg weakness, endorses recently slipping down the stairs although was able blunt her fall. Pain is radiating down her hips and knee. She is taking ibuprofen and tylenol as needed which takes the edge off the pain. Previously seen at [facility] by Dr. Ophelia Charter for management of neck pain.   Assessment and Plan: 1. Chronic bilateral low back pain with bilateral sciatica -Cyclobenzaprine 10 mg 3 times daily . -Referral to Orthopedic Surgery  Follow Up Instructions: Schedule CPE with fasting labs   I discussed the assessment and treatment plan with the patient. The patient was provided an opportunity to ask questions and all were answered. The patient agreed with  the plan and demonstrated an understanding of the instructions.   The patient was advised to call back or seek an in-person evaluation if the symptoms worsen or if the condition fails to improve as anticipated.  I provided 25 minutes of non-face-to-face time during this encounter.   Joaquin Courts, FNP

## 2018-12-29 ENCOUNTER — Encounter: Payer: Self-pay | Admitting: Orthopaedic Surgery

## 2018-12-29 ENCOUNTER — Ambulatory Visit: Payer: PRIVATE HEALTH INSURANCE

## 2018-12-29 ENCOUNTER — Ambulatory Visit: Payer: Self-pay

## 2018-12-29 ENCOUNTER — Ambulatory Visit (INDEPENDENT_AMBULATORY_CARE_PROVIDER_SITE_OTHER): Payer: PRIVATE HEALTH INSURANCE | Admitting: Orthopaedic Surgery

## 2018-12-29 ENCOUNTER — Other Ambulatory Visit: Payer: Self-pay

## 2018-12-29 VITALS — Ht 61.0 in | Wt 155.0 lb

## 2018-12-29 DIAGNOSIS — M25561 Pain in right knee: Secondary | ICD-10-CM | POA: Diagnosis not present

## 2018-12-29 DIAGNOSIS — M25562 Pain in left knee: Secondary | ICD-10-CM

## 2018-12-29 DIAGNOSIS — M25551 Pain in right hip: Secondary | ICD-10-CM | POA: Diagnosis not present

## 2018-12-29 DIAGNOSIS — G8929 Other chronic pain: Secondary | ICD-10-CM | POA: Diagnosis not present

## 2018-12-29 DIAGNOSIS — M545 Low back pain, unspecified: Secondary | ICD-10-CM

## 2018-12-29 DIAGNOSIS — M25552 Pain in left hip: Secondary | ICD-10-CM

## 2018-12-29 NOTE — Progress Notes (Signed)
Office Visit Note   Patient: Shannon Spears           Date of Birth: 12-17-1980           MRN: [id number] Visit Date: 12/29/2018              Requested by: Shannon NeighborsHarris, Shannon Spears [address] [place], [place] [id number] PCP: Shannon NeighborsHarris, Shannon Spears   Assessment & Plan: Visit Diagnoses:  1. Chronic bilateral low back pain, unspecified whether sciatica present   2. Chronic pain of both knees   3. Bilateral hip pain     Plan: We will set her up for some physical therapy for her knees for treatment of her chondromalacia.  Terminal arc extension straight leg raising, isometrics.  Evaluation treatment for back symptoms with core strengthening exercises.  Patient asked about pain medication I discussed with her with history of bipolar disorder narcotics should be avoided unless she had surgery or severe acute injury.  She states she cannot take anti-inflammatories and it was noted the renal tests that are present in the chart were normal in the past.  She states she gets them checked twice a year.  She could use plain Tylenol.  I plan to recheck her in 2 months.  Follow-Up Instructions: No follow-ups on file.   Orders:  Orders Placed This Encounter  Procedures   XR Pelvis 1-2 Views   XR KNEE 3 VIEW RIGHT   XR Lumbar Spine 2-3 Views   XR Knee 1-2 Views Left   No orders of the defined types were placed in this encounter.     Procedures: No procedures performed   Clinical Data: No additional findings.   Subjective: Chief Complaint  Patient presents with   Lower Back - Pain   Left Knee - Pain   Right Knee - Pain   Left Hip - Pain   Right Hip - Pain    HPI 38 year old female mother of 38-year-old having problems with bilateral hip pain back pain and some knee pain.  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  She has had problems when she gets from sitting to standing she has been helping her daughter  with dance class and this is aggravated her knee symptoms as well as her back.  Problems getting up and down from the floor.  She denies any associated bowel or bladder symptoms she has been treated for some neck problems with therapy in the past.  Bipolar 1 disorder.  History of chronic posttraumatic stress.  Review of Systems 14 point view of systems updated unchanged from 12/08/2017 other than as mentioned in HPI.   Objective: Vital Signs: Ht 5\' 1"  (1.549 m)    Wt 155 lb (70.3 kg)    LMP 10/02/2012    BMI 29.29 kg/m   Physical Exam Constitutional:      Appearance: She is well-developed.  HENT:     Head: Normocephalic.     Right Ear: External ear normal.     Left Ear: External ear normal.  Eyes:     Pupils: Pupils are equal, round, and reactive to light.  Neck:     Thyroid: No thyromegaly.     Trachea: No tracheal deviation.  Cardiovascular:     Rate and Rhythm: Normal rate.  Pulmonary:     Effort: Pulmonary effort is normal.  Abdominal:     Palpations: Abdomen is soft.  Skin:    General: Skin is warm and dry.  Neurological:     Mental Status: She is alert and oriented to person, place, and time.  Psychiatric:        Behavior: Behavior normal.     Ortho Exam patient'Spears pelvis is level she is able heel and toe walk with some difficulty.  Crepitus with knee extension increased pain with patellar loading.  Full extension good flexion collateral crucial ligament exam is normal negative logroll of the hip she has tenderness over trochanters over the sciatic notch over the lumbosacral junction and over the sacroiliac joints paralumbar muscles all are tender with palpation.  Negative popliteal compression test anterior tib gastrocsoleus is active.  Knee and ankle jerk are 2+ and symmetrical distal pulses are normal.  No rash over exposed skin.  Specialty Comments:  No specialty comments available.  Imaging: No results found.   PMFS History: Patient Active Problem List    Diagnosis Date Noted   Bipolar I disorder, current or most recent episode depressed, in partial remission with mixed features (HCC) 06/16/2018   Chronic post-traumatic stress disorder 06/16/2018   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   Opioid use disorder, mild, in sustained remission (HCC) 06/16/2018   Spears/P cervical spinal fusion 12/08/2017   Protrusion of cervical intervertebral disc 12/08/2017   HNP (herniated nucleus pulposus), cervical 10/02/2012    Class: Diagnosis of   Past Medical History:  Diagnosis Date   Anxiety    Bipolar 1 disorder, manic, mild (HCC)    CKD (chronic kidney disease)    Depression    Fibromyalgia    GERD (gastroesophageal reflux disease)    Headache(784.0)     Family History  Problem Relation Age of Onset   Bipolar disorder Mother    Diabetes Mother    Hypertension Mother    Hypertension Father    Colon cancer Paternal Grandmother     Past Surgical History:  Procedure Laterality Date   ANTERIOR CERVICAL DECOMP/DISCECTOMY FUSION N/A 10/02/2012   Procedure: C4-5, C5-6 anterior cervical discectomy and fusion, allograft plate;  Surgeon: Shannon Manges, MD;  Location: [facility] OR;  Service: Orthopedics;  Laterality: N/A;   CYSTOGRAPHY     LAPAROSCOPIC TOTAL HYSTERECTOMY     OOPHORECTOMY     TUBAL LIGATION     Social History   Occupational History   Not on file  Tobacco Use   Smoking status: Current Every Day Smoker    Packs/day: 0.50    Types: Cigarettes   Smokeless tobacco: Never Used  Substance and Sexual Activity   Alcohol use: Yes    Comment: rarely   Drug use: No   Sexual activity: Not on file

## 2019-01-03 ENCOUNTER — Telehealth: Payer: Self-pay | Admitting: Adult Health

## 2019-01-03 NOTE — Telephone Encounter (Signed)
Pt informed of potential exposure at [facility] 12/29/18. Offered pt an appt for screening. Pt stated that she will discuss with her husband and will call back if she decides to go ahead with testing.

## 2019-01-03 NOTE — Telephone Encounter (Signed)
Left a message for a return call regarding further testing from recent ortho appointment.

## 2019-01-13 ENCOUNTER — Ambulatory Visit: Payer: PRIVATE HEALTH INSURANCE | Admitting: Family Medicine

## 2019-01-26 ENCOUNTER — Encounter: Payer: Self-pay | Admitting: Physical Therapy

## 2019-01-26 ENCOUNTER — Ambulatory Visit: Payer: PRIVATE HEALTH INSURANCE | Attending: Orthopaedic Surgery | Admitting: Physical Therapy

## 2019-01-26 ENCOUNTER — Other Ambulatory Visit: Payer: Self-pay

## 2019-01-26 DIAGNOSIS — M25562 Pain in left knee: Secondary | ICD-10-CM | POA: Diagnosis present

## 2019-01-26 DIAGNOSIS — M5441 Lumbago with sciatica, right side: Secondary | ICD-10-CM | POA: Insufficient documentation

## 2019-01-26 DIAGNOSIS — M25561 Pain in right knee: Secondary | ICD-10-CM | POA: Diagnosis present

## 2019-01-26 NOTE — Therapy (Signed)
[place], [place], [address] Phone: [phone]   Fax:  [phone]  Physical Therapy Evaluation  Patient Details  Name: Shannon Spears MRN: [id number] Date of Birth: [birth year] Referring Provider (PT): Bishop Limbo Date: 01/26/2019  PT End of Session - 01/26/19 1046    Visit Number  1    Number of Visits  30    Date for PT Re-Evaluation  03/28/19    PT Start Time  0938    PT Stop Time  1027    PT Time Calculation (min)  49 min    Activity Tolerance  Patient tolerated treatment well    Behavior During Therapy  [facility] for tasks assessed/performed       Past Medical History:  Diagnosis Date  . Anxiety   . Bipolar 1 disorder, manic, mild ([place])   . CKD (chronic kidney disease)   . Depression   . Fibromyalgia   . GERD (gastroesophageal reflux disease)   . XTGGYIRS(854.6)     Past Surgical History:  Procedure Laterality Date  . ANTERIOR CERVICAL DECOMP/DISCECTOMY FUSION N/A 10/02/2012   Procedure: C4-5, C5-6 anterior cervical discectomy and fusion, allograft plate;  Surgeon: Marybelle Killings, MD;  Location: [place];  Service: Orthopedics;  Laterality: N/A;  . CYSTOGRAPHY    . LAPAROSCOPIC TOTAL HYSTERECTOMY    . OOPHORECTOMY    . TUBAL LIGATION      There were no vitals filed for this visit.   Subjective Assessment - 01/26/19 0942    Subjective  Patient reports over a year of LBP and bilateral knee pain. X-rays of back negative, knees show sunrise patella.  Has a 38 year old.  Has stairs.    Pertinent History  Anxiety, bipolar, ACDF    Limitations  Sitting;Lifting;Standing;Walking;House hold activities    Patient Stated Goals  have less pain    Currently in Pain?  Yes    Pain Score  6     Pain Location  Back   bilateral knees as well   Pain Orientation  Lower    Pain Descriptors / Indicators  Aching    Pain Type  Acute pain    Pain Radiating Towards  talsk about pain radiating into the  left upper arm    Pain Onset  More than a month ago    Pain Frequency  Constant    Aggravating Factors   stairs, standing, walking, lifting all will increase the pain to 10/10    Pain Relieving Factors  pain meds help, pain at best is a 5/10    Effect of Pain on Daily Activities  reports limits everything         [facility] PT Assessment - 01/26/19 0001      Assessment   Medical Diagnosis  LBP and bilateral knee pain    Referring Provider (PT)  Lorin Mercy    Onset Date/Surgical Date  12/26/18    Prior Therapy  no      Precautions   Precautions  None      Balance Screen   Has the patient fallen in the past 6 months  Yes    How many times?  3   reports pain in knees   Has the patient had a decrease in activity level because of a fear of falling?   No    Is the patient reluctant to leave their home because of a fear of falling?   No  Home Environment   Additional Comments  stairs, a 38 year old, 45# dog, does housework      Prior Function   Level of Independence  Independent    Vocation  Unemployed    Leisure  no exercise      Posture/Postural Control   Posture Comments  fwd head, rounded shoulders, slouched posture      ROM / Strength   AROM / PROM / Strength  AROM;Strength      AROM   Overall AROM Comments  Knee ROM is WFL's, lumbar ROM is WNL's      Strength   Overall Strength Comments  LE strength is 3+/5 with pain in the knees      Flexibility   Soft Tissue Assessment /Muscle Length  yes    ITB  mild tightness      Palpation   Palpation comment  she does have crepitus in the bilateral knees, she does have significant lateral tracking patella, tender and tight in the lumbar area into the buttocks                Objective measurements completed on examination: See above findings.      [facility] Adult PT Treatment/Exercise - 01/26/19 0001      Modalities   Modalities  Electrical Stimulation;Moist Heat      Moist Heat Therapy   Number Minutes Moist Heat   15 Minutes    Moist Heat Location  Lumbar Spine      Electrical Stimulation   Electrical Stimulation Location  lumbar    Electrical Stimulation Action  IFC    Electrical Stimulation Parameters  supine    Electrical Stimulation Goals  Pain             PT Education - 01/26/19 1045    Education Details  VMO exercises    Person(s) Educated  Patient    Methods  Explanation;Demonstration;Tactile cues;Verbal cues;Handout    Comprehension  Verbalized understanding;Need further instruction       PT Short Term Goals - 01/26/19 1049      PT SHORT TERM GOAL #1   Title  indepednent iwth initial HEP    Time  2    Period  Weeks    Status  New        PT Long Term Goals - 01/26/19 1158      PT LONG TERM GOAL #1   Title  decrease pain 50%    Time  8    Period  Weeks    Status  New      PT LONG TERM GOAL #2   Title  increase LE strength to WNL's    Time  8    Period  Weeks    Status  New      PT LONG TERM GOAL #3   Title  understand posture and body mechanics    Time  8    Period  Weeks    Status  New      PT LONG TERM GOAL #4   Title  lift daughter without difficulty    Time  8    Period  Weeks    Status  New             Plan - 01/26/19 1047    Clinical Impression Statement  Patient reports LBP and bilateral knee pain for over a year, she is unsure of a cause, reports ACDF about 5 years ago, has stairs at home and a 38  year old.  X-rays of the lumbar spine are normal, knees show sunrise patella, she has significant lateral tracking patella bilaterally with crepitus, the ROM of the lumbar spine is WNL's and almost hyper mobile, she is weak in the core    Personal Factors and Comorbidities  Comorbidity 3+    Comorbidities  ACDF, anxiety, bipolar, fibromyalgia    Stability/Clinical Decision Making  Evolving/Moderate complexity    Clinical Decision Making  Moderate    Rehab Potential  Good    PT Frequency  2x / week    PT Duration  8 weeks    PT  Treatment/Interventions  ADLs/Self Care Home Management;Cryotherapy;Ultrasound;Traction;Moist Heat;Electrical Stimulation;Iontophoresis 4mg /ml Dexamethasone;Functional mobility training;Neuromuscular re-education;Balance training;Therapeutic exercise;Therapeutic activities;Patient/family education;Manual techniques    PT Next Visit Plan  start gym with some focus on core    Consulted and Agree with Plan of Care  Patient       Patient will benefit from skilled therapeutic intervention in order to improve the following deficits and impairments:  Improper body mechanics, Pain, Postural dysfunction, Increased muscle spasms, Decreased mobility, Decreased activity tolerance, Decreased endurance, Decreased range of motion, Decreased strength, Difficulty walking  Visit Diagnosis: 1. Acute bilateral low back pain with right-sided sciatica   2. Acute pain of right knee   3. Acute pain of left knee        Problem List Patient Active Problem List   Diagnosis Date Noted  . Bipolar I disorder, current or most recent episode depressed, in partial remission with mixed features (HCC) 06/16/2018  . Chronic post-traumatic stress disorder 06/16/2018  . 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 Opioid use disorder, mild, in sustained remission (HCC) 06/16/2018  . S/P cervical spinal fusion 12/08/2017  . Protrusion of cervical intervertebral disc 12/08/2017  . HNP (herniated nucleus pulposus), cervical 10/02/2012    Class: Diagnosis of    Jearld LeschLBRIGHT,Tykel Badie W., PT 01/26/2019, 12:01 PM  [facility] [address] [place], [place], [address] Phone: [phone]   Fax:  [phone]  Name: Randie HeinzJessica Keller MRN: [id number] Date of Birth: [birth year]

## 2019-01-29 ENCOUNTER — Telehealth: Payer: Self-pay

## 2019-01-29 NOTE — Telephone Encounter (Signed)
Called patient to do their pre-visit COVID screening.  Have you been tested for COVID or are you currently waiting for COVID test results? no  Have you recently traveled internationally([place], [place], [place], [place], [place]) or within the [place] to a hotspot area([place], [place], [place], [place], [place])? no  Are you currently experiencing any of the following: fever, cough, SHOB, fatigue, body aches, loss of smell, rash, diarrhea, vomiting, severe headaches, weakness, sore throat? no  Have you been in contact with anyone who has recently travelled? no  Have you been in contact with anyone who is experiencing any of the above symptoms or been diagnosed with [place]  or works in or has recently visited a SNF? possible exposure at her Ortho office on 12/29/2018

## 2019-01-30 NOTE — Telephone Encounter (Addendum)
If patient is asymptomatic and has no URI or GI symptoms, ok to proceed with appt given exposure is more than 30 days ago

## 2019-02-01 ENCOUNTER — Ambulatory Visit (INDEPENDENT_AMBULATORY_CARE_PROVIDER_SITE_OTHER): Payer: PRIVATE HEALTH INSURANCE | Admitting: Family Medicine

## 2019-02-01 ENCOUNTER — Other Ambulatory Visit: Payer: Self-pay

## 2019-02-01 ENCOUNTER — Encounter: Payer: Self-pay | Admitting: Family Medicine

## 2019-02-01 VITALS — BP 106/74 | HR 95 | Temp 97.4°F | Resp 17 | Ht 61.0 in | Wt 152.6 lb

## 2019-02-01 DIAGNOSIS — Z13220 Encounter for screening for lipoid disorders: Secondary | ICD-10-CM

## 2019-02-01 DIAGNOSIS — Z0001 Encounter for general adult medical examination with abnormal findings: Secondary | ICD-10-CM

## 2019-02-01 DIAGNOSIS — Z131 Encounter for screening for diabetes mellitus: Secondary | ICD-10-CM

## 2019-02-01 DIAGNOSIS — Z114 Encounter for screening for human immunodeficiency virus [HIV]: Secondary | ICD-10-CM

## 2019-02-01 DIAGNOSIS — J45990 Exercise induced bronchospasm: Secondary | ICD-10-CM | POA: Diagnosis not present

## 2019-02-01 DIAGNOSIS — F3175 Bipolar disorder, in partial remission, most recent episode depressed: Secondary | ICD-10-CM

## 2019-02-01 DIAGNOSIS — Z130 Encounter for screening for diseases of the blood and blood-forming organs and certain disorders involving the immune mechanism: Secondary | ICD-10-CM

## 2019-02-01 DIAGNOSIS — R809 Proteinuria, unspecified: Secondary | ICD-10-CM | POA: Diagnosis not present

## 2019-02-01 DIAGNOSIS — Z1329 Encounter for screening for other suspected endocrine disorder: Secondary | ICD-10-CM

## 2019-02-01 DIAGNOSIS — Z0000 Encounter for general adult medical examination without abnormal findings: Secondary | ICD-10-CM

## 2019-02-01 LAB — POCT URINALYSIS DIP (CLINITEK)
Bilirubin, UA: NEGATIVE
Glucose, UA: NEGATIVE mg/dL
Ketones, POC UA: NEGATIVE mg/dL
Leukocytes, UA: NEGATIVE
Nitrite, UA: NEGATIVE
POC PROTEIN,UA: 30 — AB
Spec Grav, UA: 1.01 (ref 1.010–1.025)
Urobilinogen, UA: 0.2 E.U./dL
pH, UA: 7 (ref 5.0–8.0)

## 2019-02-01 MED ORDER — ALBUTEROL SULFATE HFA 108 (90 BASE) MCG/ACT IN AERS: 2.0000 | INHALATION_SPRAY | Freq: Four times a day (QID) | RESPIRATORY_TRACT | 2 refills | Status: DC | PRN

## 2019-02-01 MED ORDER — ALBUTEROL SULFATE (2.5 MG/3ML) 0.083% IN NEBU: 2.5000 mg | INHALATION_SOLUTION | Freq: Four times a day (QID) | RESPIRATORY_TRACT | 1 refills | Status: DC | PRN

## 2019-02-01 NOTE — Patient Instructions (Addendum)
Thank you for choosing Primary Care at [facility] to be your medical home!    Shannon Spears was seen by Molli Barrows, FNP today.   Shannon Spears's primary care provider is Scot Jun, FNP.   For the best care possible, you should try to see Molli Barrows, FNP-C whenever you come to the clinic.   We look forward to seeing you again soon!  If you have any questions about your visit today, please call us at [phone] or feel free to reach your primary care provider via [place].     Keeping You Healthy  Get These Tests 1. Blood Pressure- Have your blood pressure checked once a year by your health care provider.  Normal blood pressure is 120/80. 2. Weight- Have your body mass index (BMI) calculated to screen for obesity.  BMI is measure of body fat based on height and weight.  You can also calculate your own BMI at GravelBags.it. 3. Cholesterol- Have your cholesterol checked every 5 years starting at age 34 then yearly starting at age 58. 74. Chlamydia, HIV, and other sexually transmitted diseases- Get screened every year until age 30, then within three months of each new sexual provider. 5. Pap Test - Every 1-5 years; discuss with your health care provider. 6. Mammogram- Every 1-2 years starting at age 62--50  Take these medicines  Calcium with Vitamin D-Your body needs 1200 mg of Calcium each day and [phone] IU of Vitamin D daily.  Your body can only absorb 500 mg of Calcium at a time so Calcium must be taken in 2 or 3 divided doses throughout the day.  Multivitamin with folic acid- Once daily if it is possible for you to become pregnant.  Get these Immunizations  Gardasil-Series of three doses; prevents HPV related illness such as genital warts and cervical cancer.  Menactra-Single dose; prevents meningitis.  Tetanus shot- Every 10 years.  Flu shot-Every year.  Take these steps 1. Do not smoke-Your healthcare provider can help you quit.  For tips on  how to quit go to www.smokefree.gov or call 1-800 QUITNOW. 2. Be physically active- Exercise 5 days a week for at least 30 minutes.  If you are not already physically active, start slow and gradually work up to 30 minutes of moderate physical activity.  Examples of moderate activity include walking briskly, dancing, swimming, bicycling, etc. 3. Breast Cancer- A self breast exam every month is important for early detection of breast cancer.  For more information and instruction on self breast exams, ask your healthcare provider or https://www.patel.info/. 4. Eat a healthy diet- Eat a variety of healthy foods such as fruits, vegetables, whole grains, low fat milk, low fat cheeses, yogurt, lean meats, poultry and fish, beans, nuts, tofu, etc.  For more information go to www. Thenutritionsource.org 5. Drink alcohol in moderation- Limit alcohol intake to one drink or less per day. Never drink and drive. 6. Depression- Your emotional health is as important as your physical health.  If you're feeling down or losing interest in things you normally enjoy please talk to your healthcare provider about being screened for depression. 7. Dental visit- Brush and floss your teeth twice daily; visit your dentist twice a year. 8. Eye doctor- Get an eye exam at least every 2 years. 9. Helmet use- Always wear a helmet when riding a bicycle, motorcycle, rollerblading or skateboarding. 93. Safe sex- If you may be exposed to sexually transmitted infections, use a condom. 11. Seat belts- Seat belts can save  your live; always wear one. 12. Smoke/Carbon Monoxide detectors- These detectors need to be installed on the appropriate level of your home. Replace batteries at least once a year. 13. Skin cancer- When out in the sun please cover up and use sunscreen 15 SPF or higher. 14. Violence- If anyone is threatening or hurting you, please tell your healthcare provider.

## 2019-02-01 NOTE — Progress Notes (Signed)
Patient ID: Shannon Spears, female    DOB: Dec 15, 1980, 38 y.o.   MRN: [id number]  PCP: Scot Jun, FNP  Chief Complaint  Patient presents with  . Annual Exam    Subjective:  HPI [name] is a 38 y.o. female, current smoker presents for complete physical exam.  Darlena Koval has HNP (herniated nucleus pulposus), cervical; S/P cervical spinal fusion; Protrusion of cervical intervertebral disc; Bipolar I disorder, current or most recent episode depressed, in partial remission with mixed features ([place]); Chronic post-traumatic stress disorder; Somatic symptom disorder; Opioid use disorder, mild, in sustained remission ([place]); and S/P cholecystectomy on their problem list.   1/2 pack or less. She was previously smoking >1 pack per day. History of GERD with EGD. Take Pepcid AC PRN for symptoms. Reports history of abnormal   Health Promotion: Routine physical activity: No routine , very active daily   Current WUJ:WJXB mass index is 28.83 kg/m.  Dietary habits:  Health Screening Current/Overdue:   Immunizations: current  History of hysterectomy in 2017. Right ovary and tube removed 2015. Mammogram, began at age 79 Last Dental Exam:n/a Last Eye Exam:  2 weeks prior, no changes to visit. Wears contact lenses to correct vision.   Current home medications include: Prior to Admission medications   Medication Sig Start Date End Date Taking? Authorizing Provider  cyclobenzaprine (FLEXERIL) 10 MG tablet Take 1 tablet (10 mg total) by mouth 3 (three) times daily as needed for muscle spasms. 12/24/18  Yes Scot Jun, FNP  diazepam (VALIUM) 10 MG tablet Take 1 tablet total 10 mg every bedtime and 1/2 tablet total 5 mg up to 3 times daily as needed for anxiety 09/08/18  Yes Delight Hoh, MD  lamoTRIgine (LAMICTAL) 200 MG tablet Take 1 tablet (200 mg total) by mouth at bedtime. 09/08/18  Yes Delight Hoh, MD    Family History  Problem Relation Age of Onset  . Bipolar disorder  Mother   . Diabetes Mother   . Hypertension Mother   . Hypertension Father   . Colon cancer Paternal Grandmother     Allergies  Allergen Reactions  . Butrans [Buprenorphine] Shortness Of Breath and Rash  . Adhesive [Tape]   . Codeine     Mouth went numb  . Gabapentin Nausea And Vomiting  . Ibuprofen     Has kidney disease.  Marland Kitchen Penicillins Nausea And Vomiting and Swelling  . Tetanus Toxoids     Blister on legs  . Doxycycline Rash    Social History   Socioeconomic History  . Marital status: Married    Spouse name: Not on file  . Number of children: Not on file  . Years of education: Not on file  . Highest education level: Not on file  Occupational History  . Not on file  Social Needs  . Financial resource strain: Not on file  . Food insecurity    Worry: Not on file    Inability: Not on file  . Transportation needs    Medical: Not on file    Non-medical: Not on file  Tobacco Use  . Smoking status: Current Every Day Smoker    Packs/day: 0.50    Types: Cigarettes  . Smokeless tobacco: Never Used  Substance and Sexual Activity  . Alcohol use: Yes    Comment: rarely  . Drug use: No  . Sexual activity: Not on file  Lifestyle  . Physical activity    Days per week: Not on file  Minutes per session: Not on file  . Stress: Not on file  Relationships  . Social Musicianconnections    Talks on phone: Not on file    Gets together: Not on file    Attends religious service: Not on file    Active member of club or organization: Not on file    Attends meetings of clubs or organizations: Not on file    Relationship status: Not on file  . Intimate partner violence    Fear of current or ex partner: Not on file    Emotionally abused: Not on file    Physically abused: Not on file    Forced sexual activity: Not on file  Other Topics Concern  . Not on file  Social History Narrative  . Not on file    Review of Systems  Pertinent negatives listed in HPI Past Medical, Surgical  Family and Social History reviewed and updated.  Objective:   Today's Vitals   02/01/19 0941  BP: 106/74  Pulse: 95  Resp: 17  Temp: (!) 97.4 F (36.3 C)  TempSrc: Temporal  SpO2: 97%  Weight: 152 lb 9.6 oz (69.2 kg)  Height: 5\' 1"  (1.549 m)    Wt Readings from Last 3 Encounters:  02/01/19 152 lb 9.6 oz (69.2 kg)  12/29/18 155 lb (70.3 kg)  12/08/17 165 lb (74.8 kg)   Physical Exam General appearance: alert, well developed, well nourished, cooperative and in no distress Head: Normocephalic, without obvious abnormality, atraumatic Respiratory: Respirations even and unlabored, normal respiratory rate Heart: rate and rhythm normal. No gallop or murmurs noted on exam  Abdomen: BS +, no distention, no rebound tenderness, or no mass Extremities: No gross deformities Skin: Skin color, texture, turgor normal. No rashes seen  Psych: Appropriate mood and affect. Neurologic: Mental status: Alert, oriented to person, place, and time, thought content appropriate.   Assessment & Plan:  1. Annual physical exam -Age-appropriate anitciptory   2. Screening for diabetes mellitus -Checking an A1C   3. Screening for thyroid disorder -Checking TSH  4. Bipolar I disorder, current or most recent episode depressed, in partial remission with mixed features [facility](HCC) -Patient is [facility] for mental health management.  5. Exercise-induced asthma -Continue albuterol inhaler 2 puffs every 6 hours as needed. -For severe wheezing albuterol nebulizer treatment every 6 hours as needed. -Encourage smoking cessation as this will only worsen frequency and recurrence of asthma.  6. Proteinuria, unspecified type - POCT URINALYSIS DIP (CLINITEK)  7. Screening for deficiency anemia - CBC with Differential  8. Screening for HIV without presence of risk factors - HIV antibody (with reflex)  9. Screening, lipid - Lipid panel  Joaquin CourtsKimberly Maryem Shuffler, FNP [facility]  [place] [address], [place] [id number] [phone]: [phone]

## 2019-02-02 ENCOUNTER — Ambulatory Visit: Payer: PRIVATE HEALTH INSURANCE | Admitting: Physical Therapy

## 2019-02-02 DIAGNOSIS — M5441 Lumbago with sciatica, right side: Secondary | ICD-10-CM

## 2019-02-02 DIAGNOSIS — M25562 Pain in left knee: Secondary | ICD-10-CM

## 2019-02-02 DIAGNOSIS — M25561 Pain in right knee: Secondary | ICD-10-CM

## 2019-02-02 LAB — CBC WITH DIFFERENTIAL/PLATELET
Basophils Absolute: 0.1 10*3/uL (ref 0.0–0.2)
Basos: 0 %
EOS (ABSOLUTE): 0.3 10*3/uL (ref 0.0–0.4)
Eos: 3 %
Hematocrit: 41.8 % (ref 34.0–46.6)
Hemoglobin: 14.2 g/dL (ref 11.1–15.9)
Immature Grans (Abs): 0 10*3/uL (ref 0.0–0.1)
Immature Granulocytes: 0 %
Lymphocytes Absolute: 3.7 10*3/uL — ABNORMAL HIGH (ref 0.7–3.1)
Lymphs: 29 %
MCH: 32.6 pg (ref 26.6–33.0)
MCHC: 34 g/dL (ref 31.5–35.7)
MCV: 96 fL (ref 79–97)
Monocytes Absolute: 0.5 10*3/uL (ref 0.1–0.9)
Monocytes: 4 %
Neutrophils Absolute: 8.3 10*3/uL — ABNORMAL HIGH (ref 1.4–7.0)
Neutrophils: 64 %
Platelets: 518 10*3/uL — ABNORMAL HIGH (ref 150–450)
RBC: 4.36 x10E6/uL (ref 3.77–5.28)
RDW: 13.6 % (ref 11.7–15.4)
WBC: 13 10*3/uL — ABNORMAL HIGH (ref 3.4–10.8)

## 2019-02-02 LAB — COMPREHENSIVE METABOLIC PANEL
ALT: 22 IU/L (ref 0–32)
AST: 22 IU/L (ref 0–40)
Albumin/Globulin Ratio: 1.5 (ref 1.2–2.2)
Albumin: 4.1 g/dL (ref 3.8–4.8)
Alkaline Phosphatase: 180 IU/L — ABNORMAL HIGH (ref 39–117)
BUN/Creatinine Ratio: 11 (ref 9–23)
BUN: 8 mg/dL (ref 6–20)
Bilirubin Total: 0.3 mg/dL (ref 0.0–1.2)
CO2: 25 mmol/L (ref 20–29)
Calcium: 9.2 mg/dL (ref 8.7–10.2)
Chloride: 98 mmol/L (ref 96–106)
Creatinine, Ser: 0.7 mg/dL (ref 0.57–1.00)
GFR calc Af Amer: 127 mL/min/{1.73_m2} (ref >59)
GFR calc non Af Amer: 110 mL/min/{1.73_m2} (ref >59)
Globulin, Total: 2.8 g/dL (ref 1.5–4.5)
Glucose: 87 mg/dL (ref 65–99)
Potassium: 5 mmol/L (ref 3.5–5.2)
Sodium: 135 mmol/L (ref 134–144)
Total Protein: 6.9 g/dL (ref 6.0–8.5)

## 2019-02-02 LAB — THYROID PANEL WITH TSH
Free Thyroxine Index: 2.4 (ref 1.2–4.9)
T3 Uptake Ratio: 23 % — ABNORMAL LOW (ref 24–39)
T4, Total: 10.6 ug/dL (ref 4.5–12.0)
TSH: 4.31 u[IU]/mL (ref 0.450–4.500)

## 2019-02-02 LAB — LIPID PANEL
Chol/HDL Ratio: 6.1 ratio — ABNORMAL HIGH (ref 0.0–4.4)
Cholesterol, Total: 281 mg/dL — ABNORMAL HIGH (ref 100–199)
HDL: 46 mg/dL (ref >39)
LDL Calculated: 207 mg/dL — ABNORMAL HIGH (ref 0–99)
Triglycerides: 140 mg/dL (ref 0–149)
VLDL Cholesterol Cal: 28 mg/dL (ref 5–40)

## 2019-02-02 LAB — HIV ANTIBODY (ROUTINE TESTING W REFLEX): HIV Screen 4th Generation wRfx: NONREACTIVE

## 2019-02-02 LAB — HEMOGLOBIN A1C
Est. average glucose Bld gHb Est-mCnc: 108 mg/dL
Hgb A1c MFr Bld: 5.4 % (ref 4.8–5.6)

## 2019-02-02 NOTE — Therapy (Signed)
[place], [place], [address] Phone: [phone]   Fax:  [phone]  Physical Therapy Treatment  Patient Details  Name: Shannon Spears MRN: [id number] Date of Birth: 1981-01-17 Referring Provider (PT): Bishop Limbo Date: 02/02/2019  PT End of Session - 02/02/19 1037    Visit Number  2    Number of Visits  30    Date for PT Re-Evaluation  03/28/19    PT Start Time  1540    PT Stop Time  0867    PT Time Calculation (min)  50 min       Past Medical History:  Diagnosis Date  . Anxiety   . Bipolar 1 disorder, manic, mild ([place])   . CKD (chronic kidney disease)   . Depression   . Fibromyalgia   . GERD (gastroesophageal reflux disease)   . YPPJKDTO(671.2)     Past Surgical History:  Procedure Laterality Date  . ANTERIOR CERVICAL DECOMP/DISCECTOMY FUSION N/A 10/02/2012   Procedure: C4-5, C5-6 anterior cervical discectomy and fusion, allograft plate;  Surgeon: Marybelle Killings, MD;  Location: [facility];  Service: Orthopedics;  Laterality: N/A;  . CYSTOGRAPHY    . LAPAROSCOPIC TOTAL HYSTERECTOMY    . OOPHORECTOMY    . TUBAL LIGATION      There were no vitals filed for this visit.  Subjective Assessment - 02/02/19 0955    Subjective  Back is okay now 2/10 but will get worse as I do more through day. knees hurt esp on humid day. lost HEP paperwork but found yesterdya so did HEP 1x without issues    Currently in Pain?  Yes    Pain Score  6     Pain Location  Knee    Pain Orientation  Right;Left                       [facility] Adult PT Treatment/Exercise - 02/02/19 0001      Exercises   Exercises  Lumbar;Knee/Hip      Lumbar Exercises: Aerobic   Nustep  L 4 5 min      Lumbar Exercises: Machines for Strengthening   Cybex Lumbar Extension  black tband 2 sets 10    Other Lumbar Machine Exercise  seated row and lat pull 20# 2 sets 10      Knee/Hip Exercises: Standing   Hip  Flexion  Stengthening;Both;2 sets;10 reps;Knee bent   2# 1 set toes fwd 1 set toes ER     Knee/Hip Exercises: Seated   Long Arc Quad  Strengthening;Both;2 sets;10 reps;Weights   vmo ball squeeze   Long Arc Quad Weight  2 lbs.    Ball Squeeze  2 sets 10 hold 3 sec    Clamshell with TheraBand  Green   2 sets 10   Marching  Both;2 sets;10 reps   green tband   Hamstring Curl  Strengthening;Both;2 sets;10 reps   green tband   Sit to Sand  10 reps;without UE support   wt ball     Modalities   Modalities  Electrical Stimulation;Moist Heat;Cryotherapy      Moist Heat Therapy   Number Minutes Moist Heat  15 Minutes    Moist Heat Location  Lumbar Spine      Cryotherapy   Number Minutes Cryotherapy  15 Minutes    Cryotherapy Location  Knee    Type of Cryotherapy  Ice pack  Chartered certified accountant Parameters  supine    Electrical Stimulation Goals  Pain               PT Short Term Goals - 02/02/19 1037      PT SHORT TERM GOAL #1   Title  indepednent iwth initial HEP    Status  Achieved        PT Long Term Goals - 01/26/19 1158      PT LONG TERM GOAL #1   Title  decrease pain 50%    Time  8    Period  Weeks    Status  New      PT LONG TERM GOAL #2   Title  increase LE strength to WNL's    Time  8    Period  Weeks    Status  New      PT LONG TERM GOAL #3   Title  understand posture and body mechanics    Time  8    Period  Weeks    Status  New      PT LONG TERM GOAL #4   Title  lift daughter without difficulty    Time  8    Period  Weeks    Status  New            Plan - 02/02/19 1037    Clinical Impression Statement  pt verb independance with HEP -STG met. tolerated initial progression of ther ex fair, some increased c/o pain and muscles fatigue. posture cuing needed and cuing for full ROM with ex ex with LE. tried estim to BIL knees  today since they were hurting more tahn back today    PT Treatment/Interventions  ADLs/Self Care Home Management;Cryotherapy;Ultrasound;Traction;Moist Heat;Electrical Stimulation;Iontophoresis 67m/ml Dexamethasone;Functional mobility training;Neuromuscular re-education;Balance training;Therapeutic exercise;Therapeutic activities;Patient/family education;Manual techniques    PT Next Visit Plan  assess how pt felt after last session progress LE and core ex as tolerated       Patient will benefit from skilled therapeutic intervention in order to improve the following deficits and impairments:  Improper body mechanics, Pain, Postural dysfunction, Increased muscle spasms, Decreased mobility, Decreased activity tolerance, Decreased endurance, Decreased range of motion, Decreased strength, Difficulty walking  Visit Diagnosis: 1. Acute pain of right knee   2. Acute pain of left knee   3. Acute bilateral low back pain with right-sided sciatica        Problem List Patient Active Problem List   Diagnosis Date Noted  . Bipolar I disorder, current or most recent episode depressed, in partial remission with mixed features ([place] 06/16/2018  . Chronic post-traumatic stress disorder 06/16/2018  . 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 Opioid use disorder, mild, in sustained remission ([facility] 06/16/2018  . S/P cholecystectomy 02/10/2018  . S/P cervical spinal fusion 12/08/2017  . Protrusion of cervical intervertebral disc 12/08/2017  . HNP (herniated nucleus pulposus), cervical 10/02/2012    Class: Diagnosis of    ,ANGIE PTA 02/02/2019, 10:40 AM  [address] [address]Phone: [phone]  Fax:  [phone] Name: Shannon HarklessMRN: [id number] of Birth: [birth year]

## 2019-02-04 ENCOUNTER — Other Ambulatory Visit: Payer: Self-pay

## 2019-02-04 ENCOUNTER — Ambulatory Visit: Payer: PRIVATE HEALTH INSURANCE | Attending: Orthopaedic Surgery | Admitting: Physical Therapy

## 2019-02-04 DIAGNOSIS — M25562 Pain in left knee: Secondary | ICD-10-CM | POA: Insufficient documentation

## 2019-02-04 DIAGNOSIS — M25561 Pain in right knee: Secondary | ICD-10-CM | POA: Insufficient documentation

## 2019-02-04 DIAGNOSIS — M5441 Lumbago with sciatica, right side: Secondary | ICD-10-CM | POA: Insufficient documentation

## 2019-02-04 NOTE — Therapy (Signed)
Shannon Spears, [place], [address] Phone: [phone]   Fax:  [phone]  Physical Therapy Treatment  Patient Details  Name: Shannon Spears MRN: [id number] Date of Birth: [birth year] Referring Provider (PT): Bishop Limbo Date: 02/04/2019  PT End of Session - 02/04/19 1030    Visit Number  3    Number of Visits  30    Date for PT Re-Evaluation  03/28/19    PT Start Time  0955    PT Stop Time  1055    PT Time Calculation (min)  60 min       Past Medical History:  Diagnosis Date  . Anxiety   . Bipolar 1 disorder, manic, mild ([place])   . CKD (chronic kidney disease)   . Depression   . Fibromyalgia   . GERD (gastroesophageal reflux disease)   . OBSJGGEZ(662.9)     Past Surgical History:  Procedure Laterality Date  . ANTERIOR CERVICAL DECOMP/DISCECTOMY FUSION N/A 10/02/2012   Procedure: C4-5, C5-6 anterior cervical discectomy and fusion, allograft plate;  Surgeon: Marybelle Killings, MD;  Location: [facility];  Service: Orthopedics;  Laterality: N/A;  . CYSTOGRAPHY    . LAPAROSCOPIC TOTAL HYSTERECTOMY    . OOPHORECTOMY    . TUBAL LIGATION      There were no vitals filed for this visit.  Subjective Assessment - 02/04/19 0955    Subjective  some soreness after last session but did okay    Currently in Pain?  Yes    Pain Score  6     Pain Location  Back    Pain Radiating Towards  RT hip and upper leg                       [facility] Adult PT Treatment/Exercise - 02/04/19 0001      Lumbar Exercises: Aerobic   Nustep  L 5 6 min      Lumbar Exercises: Machines for Strengthening   Cybex Lumbar Extension  black tband 2 sets 10    Cybex Knee Extension  5# 2 sets 10 with VMO ball squeeze    Cybex Knee Flexion  15# 2 sets 10    Leg Press  20# 2 sets 10 with VMO ball squeeze   isometric abs 2 sets 10 3 sec hold   Other Lumbar Machine Exercise  seated row and lat pull 20# 2 sets 10      Knee/Hip Exercises: Standing   Hip Flexion  Stengthening;Both;2 sets;10 reps;Knee straight;Other (comment)   1 set 10 toe fwd 2nd set toe ER. red TB   Hip Abduction  Stengthening;Both;10 reps;Knee straight   red tband   Hip Extension  Stengthening;Both;10 reps;Knee straight   red tband     Modalities   Modalities  Electrical Stimulation;Moist Heat;Cryotherapy      Cryotherapy   Number Minutes Cryotherapy  15 Minutes    Cryotherapy Location  Knee    Type of Cryotherapy  Ice pack      Electrical Stimulation   Electrical Stimulation Location  knees    Electrical Stimulation Action  premod    Electrical Stimulation Parameters  supine    Electrical Stimulation Goals  Pain               PT Short Term Goals - 02/02/19 1037      PT SHORT TERM GOAL #1   Title  indepednent iwth initial HEP  Status  Achieved        PT Long Term Goals - 01/26/19 1158      PT LONG TERM GOAL #1   Title  decrease pain 50%    Time  8    Period  Weeks    Status  New      PT LONG TERM GOAL #2   Title  increase LE strength to WNL's    Time  8    Period  Weeks    Status  New      PT LONG TERM GOAL #3   Title  understand posture and body mechanics    Time  8    Period  Weeks    Status  New      PT LONG TERM GOAL #4   Title  lift daughter without difficulty    Time  8    Period  Weeks    Status  New            Plan - 02/04/19 1030    Clinical Impression Statement  added machine strengthening today and pt tolerated fair, some RT post knee pian with HS curl. pt verb thru session feeling aysemmetrical- cued for improved mech with ex. chief c/o knees and RT hip,declined heat d/t too hot after ex.    PT Treatment/Interventions  ADLs/Self Care Home Management;Cryotherapy;Ultrasound;Traction;Moist Heat;Electrical Stimulation;Iontophoresis 4mg /ml Dexamethasone;Functional mobility training;Neuromuscular re-education;Balance training;Therapeutic exercise;Therapeutic  activities;Patient/family education;Manual techniques    PT Next Visit Plan  assess how pt felt after last session progress LE and core ex as tolerated       Patient will benefit from skilled therapeutic intervention in order to improve the following deficits and impairments:  Improper body mechanics, Pain, Postural dysfunction, Increased muscle spasms, Decreased mobility, Decreased activity tolerance, Decreased endurance, Decreased range of motion, Decreased strength, Difficulty walking  Visit Diagnosis: 1. Acute pain of right knee   2. Acute pain of left knee   3. Acute bilateral low back pain with right-sided sciatica        Problem List Patient Active Problem List   Diagnosis Date Noted  . Bipolar I disorder, current or most recent episode depressed, in partial remission with mixed features (HCC) 06/16/2018  . Chronic post-traumatic stress disorder 06/16/2018  . 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 Opioid use disorder, mild, in sustained remission (HCC) 06/16/2018  . S/P cholecystectomy 02/10/2018  . S/P cervical spinal fusion 12/08/2017  . Protrusion of cervical intervertebral disc 12/08/2017  . HNP (herniated nucleus pulposus), cervical 10/02/2012    Class: Diagnosis of    PAYSEUR,ANGIE PTA 02/04/2019, 10:33 AM  [facility] [address] [place], [place], [address] Phone: [phone]   Fax:  [phone]  Name: Shannon Spears MRN: [id number] Date of Birth: 17-Mar-1981

## 2019-02-07 ENCOUNTER — Telehealth: Payer: Self-pay | Admitting: Family Medicine

## 2019-02-07 NOTE — Telephone Encounter (Signed)
Notify patient of recent labs: WBC are slightly elevated. Inquire if the patient feels ill at all. Urine was negative of infection and shows chronic protein and blood for which she see's a urologist for. If she is asymptomatic, no need to repeat CBC. She has chronic elevated platelets for which I do not see any hematology work-up. I can place a referral if patient would like further evaluation of chronically elevated platelets.  cholesterol panel is abnormal and indicates hyperlipidemia. I would recommend statin therapy if numbers do no improve. For now, I recommend lifestyle changes such as engaging in routine physical activity with a goal of 150 minutes per week, increasing intake of vegetables, fruits, fiber, and selecting lean cuts of meat.  Repeat lipids 12 weeks if no improvement, I recommend statin therapy.

## 2019-02-08 NOTE — Telephone Encounter (Signed)
Left voice mail to call back 

## 2019-02-09 ENCOUNTER — Other Ambulatory Visit: Payer: Self-pay

## 2019-02-09 ENCOUNTER — Telehealth: Payer: Self-pay | Admitting: Family Medicine

## 2019-02-09 ENCOUNTER — Ambulatory Visit: Payer: PRIVATE HEALTH INSURANCE | Admitting: Physical Therapy

## 2019-02-09 DIAGNOSIS — M5441 Lumbago with sciatica, right side: Secondary | ICD-10-CM

## 2019-02-09 DIAGNOSIS — M25562 Pain in left knee: Secondary | ICD-10-CM

## 2019-02-09 DIAGNOSIS — M25561 Pain in right knee: Secondary | ICD-10-CM | POA: Diagnosis not present

## 2019-02-09 NOTE — Therapy (Signed)
[place], [place], [address] Phone: [phone]   Fax:  [phone]  Physical Therapy Treatment  Patient Details  Name: Mariaha Ellington MRN: [id number] Date of Birth: May 25, 1981 Referring Provider (PT): Bishop Limbo Date: 02/09/2019  PT End of Session - 02/09/19 1057    Visit Number  4    Number of Visits  30    Date for PT Re-Evaluation  03/28/19    PT Start Time  1005    PT Stop Time  1106    PT Time Calculation (min)  61 min       Past Medical History:  Diagnosis Date  . Anxiety   . Bipolar 1 disorder, manic, mild ([place])   . CKD (chronic kidney disease)   . Depression   . Fibromyalgia   . GERD (gastroesophageal reflux disease)   . OHYWVPXT(062.6)     Past Surgical History:  Procedure Laterality Date  . ANTERIOR CERVICAL DECOMP/DISCECTOMY FUSION N/A 10/02/2012   Procedure: C4-5, C5-6 anterior cervical discectomy and fusion, allograft plate;  Surgeon: Marybelle Killings, MD;  Location: [facility];  Service: Orthopedics;  Laterality: N/A;  . CYSTOGRAPHY    . LAPAROSCOPIC TOTAL HYSTERECTOMY    . OOPHORECTOMY    . TUBAL LIGATION      There were no vitals filed for this visit.  Subjective Assessment - 02/09/19 1019    Subjective  pretty good this morning but have not done much yet today, headed out camping tomorrow- worried about how I will feel after tha    Currently in Pain?  Yes    Pain Score  3                        [facility] Adult PT Treatment/Exercise - 02/09/19 0001      High Level Balance   High Level Balance Activities  Negotitating around obstacles;Negotiating over obstacles   SLS dynamic surfaces     Lumbar Exercises: Aerobic   Nustep  L 5 6 min      Lumbar Exercises: Machines for Strengthening   Cybex Lumbar Extension  black tband 2 sets 10   flex/ext   Cybex Knee Extension  5# 2 sets 10 with VMO ball squeeze    Cybex Knee Flexion  15# 2 sets 10    Other  Lumbar Machine Exercise  seated row and lat pull 20# 2 sets 10      Lumbar Exercises: Standing   Shoulder Extension  Both;15 reps;Theraband   red   Other Standing Lumbar Exercises  red tband shld abd and ER 15 each      Knee/Hip Exercises: Standing   Hip Flexion  Stengthening;Both;2 sets;Knee straight;Other (comment);15 reps   red tband   Hip Abduction  Stengthening;Both;Knee straight;15 reps   red tband   Hip Extension  Stengthening;Both;Knee straight;15 reps   red tband     Knee/Hip Exercises: Seated   Sit to Sand  10 reps;without UE support   wt ball     Cryotherapy   Number Minutes Cryotherapy  15 Minutes    Cryotherapy Location  Knee    Type of Cryotherapy  Ice pack      Electrical Stimulation   Electrical Stimulation Location  knees    Electrical Stimulation Action  premod    Electrical Stimulation Parameters  supine    Electrical Stimulation Goals  Pain  PT Short Term Goals - 02/02/19 1037      PT SHORT TERM GOAL #1   Title  indepednent iwth initial HEP    Status  Achieved        PT Long Term Goals - 01/26/19 1158      PT LONG TERM GOAL #1   Title  decrease pain 50%    Time  8    Period  Weeks    Status  New      PT LONG TERM GOAL #2   Title  increase LE strength to WNL's    Time  8    Period  Weeks    Status  New      PT LONG TERM GOAL #3   Title  understand posture and body mechanics    Time  8    Period  Weeks    Status  New      PT LONG TERM GOAL #4   Title  lift daughter without difficulty    Time  8    Period  Weeks    Status  New            Plan - 02/09/19 1057    Clinical Impression Statement  added in dynamic balance and obstacle neg. decreased SLS noted esp on left. tolerated ex better as noted by less breaks. continues to need some postural cuing    PT Treatment/Interventions  ADLs/Self Care Home Management;Cryotherapy;Ultrasound;Traction;Moist Heat;Electrical Stimulation;Iontophoresis 4mg /ml  Dexamethasone;Functional mobility training;Neuromuscular re-education;Balance training;Therapeutic exercise;Therapeutic activities;Patient/family education;Manual techniques    PT Next Visit Plan  asses how pt feels afte camping trip       Patient will benefit from skilled therapeutic intervention in order to improve the following deficits and impairments:  Improper body mechanics, Pain, Postural dysfunction, Increased muscle spasms, Decreased mobility, Decreased activity tolerance, Decreased endurance, Decreased range of motion, Decreased strength, Difficulty walking  Visit Diagnosis: 1. Acute pain of right knee   2. Acute pain of left knee   3. Acute bilateral low back pain with right-sided sciatica        Problem List Patient Active Problem List   Diagnosis Date Noted  . Bipolar I disorder, current or most recent episode depressed, in partial remission with mixed features (HCC) 06/16/2018  . Chronic post-traumatic stress disorder 06/16/2018  . 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 Opioid use disorder, mild, in sustained remission (HCC) 06/16/2018  . S/P cholecystectomy 02/10/2018  . S/P cervical spinal fusion 12/08/2017  . Protrusion of cervical intervertebral disc 12/08/2017  . HNP (herniated nucleus pulposus), cervical 10/02/2012    Class: Diagnosis of    Sophiya Morello,ANGIE  PTA 02/09/2019, 10:59 AM  [facility] [address] [place], [place], [address] Phone: [phone]   Fax:  [phone]  Name: Randie HeinzJessica Delisa MRN: [id number] Date of Birth: [birth year]

## 2019-02-09 NOTE — Telephone Encounter (Signed)
Patient called back for labs please call her at [phone]

## 2019-02-11 ENCOUNTER — Encounter: Payer: Self-pay | Admitting: *Deleted

## 2019-02-11 NOTE — Telephone Encounter (Signed)
Left message on voicemail. Will mail a letter due to multiple attempts to call patient back.

## 2019-02-12 NOTE — Telephone Encounter (Signed)
Patient notified of lab results & recommendations. Expressed understanding. States that she has seen Hematology in the past & she felt like there wasn't much work up done. Would not like a second opinion at this time. She states that she does not feel "ill" at this time. Has a history of WBC being elevated in the past as well.  She is currently on vacation & will make a 12 week lab appointment when she returns.

## 2019-02-15 ENCOUNTER — Telehealth: Payer: Self-pay | Admitting: Psychiatry

## 2019-02-15 DIAGNOSIS — F4312 Post-traumatic stress disorder, chronic: Secondary | ICD-10-CM

## 2019-02-15 DIAGNOSIS — F3175 Bipolar disorder, in partial remission, most recent episode depressed: Secondary | ICD-10-CM

## 2019-02-15 MED ORDER — LAMOTRIGINE 200 MG PO TABS: 200.0000 mg | ORAL_TABLET | Freq: Every day | ORAL | 0 refills | Status: DC

## 2019-02-15 NOTE — Telephone Encounter (Signed)
Patient will be out of Lamictal before appt. On 08/04 please send refill to [facility]

## 2019-02-15 NOTE — Telephone Encounter (Signed)
Last appointment 09/08/2018 included 10-month supply of Lamictal now running low before the 03/09/2019 next appointment. She is apparently off [place] but has Valium as needed.  Daughter continues therapy in which patient is engaged.  Lamictal 200 mg nightly #30 with no refill is sent to [facility].

## 2019-02-16 ENCOUNTER — Other Ambulatory Visit: Payer: Self-pay

## 2019-02-16 ENCOUNTER — Ambulatory Visit: Payer: PRIVATE HEALTH INSURANCE | Admitting: Physical Therapy

## 2019-02-16 ENCOUNTER — Encounter: Payer: Self-pay | Admitting: Physical Therapy

## 2019-02-16 DIAGNOSIS — M5441 Lumbago with sciatica, right side: Secondary | ICD-10-CM

## 2019-02-16 DIAGNOSIS — M25561 Pain in right knee: Secondary | ICD-10-CM | POA: Diagnosis not present

## 2019-02-16 DIAGNOSIS — M25562 Pain in left knee: Secondary | ICD-10-CM

## 2019-02-16 NOTE — Therapy (Signed)
[place], [place], [address] Phone: [phone]   Fax:  [phone]  Physical Therapy Treatment  Patient Details  Name: Shannon Spears MRN: [id number] Date of Birth: [birth year] Referring Provider (PT): Bishop Limbo Date: 02/16/2019  PT End of Session - 02/16/19 1008    Visit Number  5    Date for PT Re-Evaluation  03/28/19    PT Start Time  0932    PT Stop Time  1016    PT Time Calculation (min)  44 min    Activity Tolerance  Patient tolerated treatment well    Behavior During Therapy  [facility] for tasks assessed/performed       Past Medical History:  Diagnosis Date  . Anxiety   . Bipolar 1 disorder, manic, mild ([place])   . CKD (chronic kidney disease)   . Depression   . Fibromyalgia   . GERD (gastroesophageal reflux disease)   . ONGEXBMW(413.2)     Past Surgical History:  Procedure Laterality Date  . ANTERIOR CERVICAL DECOMP/DISCECTOMY FUSION N/A 10/02/2012   Procedure: C4-5, C5-6 anterior cervical discectomy and fusion, allograft plate;  Surgeon: Marybelle Killings, MD;  Location: [facility];  Service: Orthopedics;  Laterality: N/A;  . CYSTOGRAPHY    . LAPAROSCOPIC TOTAL HYSTERECTOMY    . OOPHORECTOMY    . TUBAL LIGATION      There were no vitals filed for this visit.  Subjective Assessment - 02/16/19 0936    Subjective  Pt reports soreness this morning from housework yesterday    Pertinent History  Anxiety, bipolar, ACDF    Limitations  Sitting;Lifting;Standing;Walking;House hold activities    Patient Stated Goals  have less pain    Currently in Pain?  Yes    Pain Score  8     Pain Location  --   Knees and back                      [facility] Adult PT Treatment/Exercise - 02/16/19 0001      Lumbar Exercises: Aerobic   Nustep  L 5 6 min      Lumbar Exercises: Machines for Strengthening   Other Lumbar Machine Exercise  seated row and lat pull 20# 2 sets 10      Knee/Hip  Exercises: Seated   Long Arc Quad  Strengthening;Both;2 sets;10 reps;Weights    Long Arc Quad Weight  2 lbs.    Hamstring Curl  Strengthening;Both;2 sets;10 reps    Hamstring Limitations  green Tband       Cryotherapy   Number Minutes Cryotherapy  15 Minutes    Cryotherapy Location  Knee    Type of Cryotherapy  Ice pack      Electrical Stimulation   Electrical Stimulation Location  knees    Electrical Stimulation Action  pre mod    Electrical Stimulation Parameters  supine    Electrical Stimulation Goals  Pain               PT Short Term Goals - 02/02/19 1037      PT SHORT TERM GOAL #1   Title  indepednent iwth initial HEP    Status  Achieved        PT Long Term Goals - 01/26/19 1158      PT LONG TERM GOAL #1   Title  decrease pain 50%    Time  8    Period  Weeks  Status  New      PT LONG TERM GOAL #2   Title  increase LE strength to WNL's    Time  8    Period  Weeks    Status  New      PT LONG TERM GOAL #3   Title  understand posture and body mechanics    Time  8    Period  Weeks    Status  New      PT LONG TERM GOAL #4   Title  lift daughter without difficulty    Time  8    Period  Weeks    Status  New            Plan - 02/16/19 1008    Clinical Impression Statement  Pt entered clinic with reports of increase pain and soreness. L knee pain reported with both seated HS curls and LAQ. She reports L knee popping with LAQ. Postural cues required with both seated rows and lats. Shorten session and modalities to help with pain.    Comorbidities  ACDF, anxiety, bipolar, fibromyalgia    Stability/Clinical Decision Making  Evolving/Moderate complexity    Rehab Potential  Good    PT Frequency  2x / week    PT Duration  8 weeks    PT Treatment/Interventions  ADLs/Self Care Home Management;Cryotherapy;Ultrasound;Traction;Moist Heat;Electrical Stimulation;Iontophoresis 4mg /ml Dexamethasone;Functional mobility training;Neuromuscular  re-education;Balance training;Therapeutic exercise;Therapeutic activities;Patient/family education;Manual techniques    PT Next Visit Plan  asses how pt feels, progress as tolerated       Patient will benefit from skilled therapeutic intervention in order to improve the following deficits and impairments:  Improper body mechanics, Pain, Postural dysfunction, Increased muscle spasms, Decreased mobility, Decreased activity tolerance, Decreased endurance, Decreased range of motion, Decreased strength, Difficulty walking  Visit Diagnosis: 1. Acute pain of left knee   2. Acute bilateral low back pain with right-sided sciatica   3. Acute pain of right knee        Problem List Patient Active Problem List   Diagnosis Date Noted  . Bipolar I disorder, current or most recent episode depressed, in partial remission with mixed features (HCC) 06/16/2018  . Chronic post-traumatic stress disorder 06/16/2018  . 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 Opioid use disorder, mild, in sustained remission (HCC) 06/16/2018  . S/P cholecystectomy 02/10/2018  . S/P cervical spinal fusion 12/08/2017  . Protrusion of cervical intervertebral disc 12/08/2017  . HNP (herniated nucleus pulposus), cervical 10/02/2012    Class: Diagnosis of    Grayce SessionsRonald G Travares Nelles, PTA 02/16/2019, 10:14 AM  [facility] [address] [place], [place], [address] Phone: [phone]   Fax:  [phone]  Name: Shannon Spears MRN: [id number] Date of Birth: [birth year]

## 2019-02-18 ENCOUNTER — Ambulatory Visit: Payer: PRIVATE HEALTH INSURANCE | Admitting: Physical Therapy

## 2019-02-18 ENCOUNTER — Other Ambulatory Visit: Payer: Self-pay

## 2019-02-18 DIAGNOSIS — M25561 Pain in right knee: Secondary | ICD-10-CM | POA: Diagnosis not present

## 2019-02-18 DIAGNOSIS — M5441 Lumbago with sciatica, right side: Secondary | ICD-10-CM

## 2019-02-18 DIAGNOSIS — M25562 Pain in left knee: Secondary | ICD-10-CM

## 2019-02-18 NOTE — Therapy (Signed)
[place], [place], [address] Phone: [phone]   Fax:  [phone]  Physical Therapy Treatment  Patient Details  Name: Shannon Spears MRN: [id number] Date of Birth: [birth year] Referring Provider (PT): Bishop Limbo Date: 02/18/2019  PT End of Session - 02/18/19 0959    Visit Number  6    Number of Visits  30    Date for PT Re-Evaluation  03/28/19    PT Start Time  0928    PT Stop Time  1020    PT Time Calculation (min)  52 min       Past Medical History:  Diagnosis Date  . Anxiety   . Bipolar 1 disorder, manic, mild ([place])   . CKD (chronic kidney disease)   . Depression   . Fibromyalgia   . GERD (gastroesophageal reflux disease)   . KGSUPJSR(159.4)     Past Surgical History:  Procedure Laterality Date  . ANTERIOR CERVICAL DECOMP/DISCECTOMY FUSION N/A 10/02/2012   Procedure: C4-5, C5-6 anterior cervical discectomy and fusion, allograft plate;  Surgeon: Marybelle Killings, MD;  Location: [facility];  Service: Orthopedics;  Laterality: N/A;  . CYSTOGRAPHY    . LAPAROSCOPIC TOTAL HYSTERECTOMY    . OOPHORECTOMY    . TUBAL LIGATION      There were no vitals filed for this visit.  Subjective Assessment - 02/18/19 0929    Subjective  tired from over doing yesterday, always on the move. I feel stronger but no real change in pain    Currently in Pain?  Yes    Pain Score  6     Pain Location  Knee    Pain Orientation  Right;Left    Pain Descriptors / Indicators  Aching;Throbbing;Other (Comment)    Effect of Pain on Daily Activities  RT knee "pops alot"    Multiple Pain Sites  Yes    Pain Score  6    Pain Location  Back         [facility] PT Assessment - 02/18/19 0001      Strength   Overall Strength Comments  LE strength is 4-/5 with pain in the knees                   [facility] Adult PT Treatment/Exercise - 02/18/19 0001      Lumbar Exercises: Aerobic   Nustep  L 5 6 min      Lumbar Exercises: Machines for Strengthening   Cybex Lumbar Extension  black tband 2 sets 10   flex/ext   Cybex Knee Extension  5# 2 sets 10 with VMO ball squeeze    Cybex Knee Flexion  15# 2 sets 10    Leg Press  40# 2 sets 10 ball squeeze    Other Lumbar Machine Exercise  seated row and lat pull 20# 2 sets 10      Knee/Hip Exercises: Standing   Lateral Step Up  Both;1 set;10 reps;Hand Hold: 1;Step Height: 6"   adductor step up   Walking with Sports Cord  4 way 5 each 40#      Cryotherapy   Number Minutes Cryotherapy  15 Minutes    Cryotherapy Location  Knee    Type of Cryotherapy  Ice pack      Electrical Stimulation   Electrical Stimulation Location  knees    Electrical Stimulation Action  premod    Electrical Stimulation Parameters  supine    Electrical  Stimulation Goals  Pain               PT Short Term Goals - 02/02/19 1037      PT SHORT TERM GOAL #1   Title  indepednent iwth initial HEP    Status  Achieved        PT Long Term Goals - 02/18/19 0953      PT LONG TERM GOAL #1   Title  decrease pain 50%    Status  On-going      PT LONG TERM GOAL #2   Title  increase LE strength to WNL's    Status  On-going      PT LONG TERM GOAL #3   Title  understand posture and body mechanics    Status  Partially Met      PT LONG TERM GOAL #4   Title  lift daughter without difficulty    Status  On-going            Plan - 02/18/19 1000    Clinical Impression Statement  pt tolerated increased ther ex today. c/o popping in RT knee and instability, added ball squeeze with ex for VMO. noted RT ankle instability with resisted gait. Progressing towards goals.    PT Treatment/Interventions  ADLs/Self Care Home Management;Cryotherapy;Ultrasound;Traction;Moist Heat;Electrical Stimulation;Iontophoresis 63m/ml Dexamethasone;Functional mobility training;Neuromuscular re-education;Balance training;Therapeutic exercise;Therapeutic activities;Patient/family education;Manual  techniques    PT Next Visit Plan  progress strength. pt feels getting stronger but no significant pain changes       Patient will benefit from skilled therapeutic intervention in order to improve the following deficits and impairments:  Improper body mechanics, Pain, Postural dysfunction, Increased muscle spasms, Decreased mobility, Decreased activity tolerance, Decreased endurance, Decreased range of motion, Decreased strength, Difficulty walking  Visit Diagnosis: 1. Acute pain of left knee   2. Acute bilateral low back pain with right-sided sciatica   3. Acute pain of right knee        Problem List Patient Active Problem List   Diagnosis Date Noted  . Bipolar I disorder, current or most recent episode depressed, in partial remission with mixed features ([place] 06/16/2018  . Chronic post-traumatic stress disorder 06/16/2018  . 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 Opioid use disorder, mild, in sustained remission ([facility] 06/16/2018  . S/P cholecystectomy 02/10/2018  . S/P cervical spinal fusion 12/08/2017  . Protrusion of cervical intervertebral disc 12/08/2017  . HNP (herniated nucleus pulposus), cervical 10/02/2012    Class: Diagnosis of    ,ANGIE PTA 02/18/2019, 10:03 AM  [place] [address]: [phone]  Fax:  [phone] Name: Shannon ButrickMRN: [id number] of Birth: [birth year]

## 2019-02-23 ENCOUNTER — Ambulatory Visit: Payer: PRIVATE HEALTH INSURANCE | Admitting: Physical Therapy

## 2019-02-23 ENCOUNTER — Other Ambulatory Visit: Payer: Self-pay

## 2019-02-23 ENCOUNTER — Encounter: Payer: Self-pay | Admitting: Physical Therapy

## 2019-02-23 DIAGNOSIS — M25561 Pain in right knee: Secondary | ICD-10-CM | POA: Diagnosis not present

## 2019-02-23 DIAGNOSIS — M5441 Lumbago with sciatica, right side: Secondary | ICD-10-CM

## 2019-02-23 NOTE — Therapy (Signed)
[facility] [place], [place], [address] Phone: [phone]   Fax:  [phone]  Physical Therapy Treatment  Patient Details  Name: Shannon Spears MRN: [id number] Date of Birth: 11/08/1980 Referring Provider (PT): Bishop Limbo Date: 02/23/2019  PT End of Session - 02/23/19 1053    Visit Number  7    Number of Visits  30    Date for PT Re-Evaluation  03/28/19    PT Start Time  6010    PT Stop Time  1109    PT Time Calculation (min)  54 min    Activity Tolerance  Patient tolerated treatment well    Behavior During Therapy  [facility] for tasks assessed/performed       Past Medical History:  Diagnosis Date  . Anxiety   . Bipolar 1 disorder, manic, mild ([place])   . CKD (chronic kidney disease)   . Depression   . Fibromyalgia   . GERD (gastroesophageal reflux disease)   . XNATFTDD(220.2)     Past Surgical History:  Procedure Laterality Date  . ANTERIOR CERVICAL DECOMP/DISCECTOMY FUSION N/A 10/02/2012   Procedure: C4-5, C5-6 anterior cervical discectomy and fusion, allograft plate;  Surgeon: Marybelle Killings, MD;  Location: [place];  Service: Orthopedics;  Laterality: N/A;  . CYSTOGRAPHY    . LAPAROSCOPIC TOTAL HYSTERECTOMY    . OOPHORECTOMY    . TUBAL LIGATION      There were no vitals filed for this visit.  Subjective Assessment - 02/23/19 1016    Subjective  "I am all right for right now"    Currently in Pain?  Yes    Pain Score  3     Pain Location  Knee    Pain Orientation  Right                       [facility] Adult PT Treatment/Exercise - 02/23/19 0001      Lumbar Exercises: Aerobic   Recumbent Bike  L1 x 6 min       Lumbar Exercises: Machines for Strengthening   Cybex Lumbar Extension  black tband 2 sets 10    Cybex Knee Extension  5# 2 sets 10     Cybex Knee Flexion  15# 2 sets 10    Leg Press  40# 2 sets 10 ball squeeze    Other Lumbar Machine Exercise  seated row and lat pull  20# 2 sets 10      Knee/Hip Exercises: Standing   Heel Raises  Both;2 sets;10 reps;2 seconds   black bar    Lateral Step Up  Both;1 set;10 reps;Hand Hold: 1;Step Height: 6"      Cryotherapy   Number Minutes Cryotherapy  15 Minutes    Cryotherapy Location  Knee    Type of Cryotherapy  Ice pack      Electrical Stimulation   Electrical Stimulation Location  knees    Electrical Stimulation Action  pre mod    Electrical Stimulation Parameters  supine    Electrical Stimulation Goals  Pain               PT Short Term Goals - 02/02/19 1037      PT SHORT TERM GOAL #1   Title  indepednent iwth initial HEP    Status  Achieved        PT Long Term Goals - 02/18/19 0953      PT LONG TERM  GOAL #1   Title  decrease pain 50%    Status  On-going      PT LONG TERM GOAL #2   Title  increase LE strength to WNL's    Status  On-going      PT LONG TERM GOAL #3   Title  understand posture and body mechanics    Status  Partially Met      PT LONG TERM GOAL #4   Title  lift daughter without difficulty    Status  On-going            Plan - 02/23/19 1054    Clinical Impression Statement  Progressing towards goals. No reports of increase pain today. No reports of R knee popping with exercises. Cues to squeeze quads with leg extensions. Cues for posture with seated rows.    Personal Factors and Comorbidities  Comorbidity 3+    Comorbidities  ACDF, anxiety, bipolar, fibromyalgia    Stability/Clinical Decision Making  Evolving/Moderate complexity    Rehab Potential  Good    PT Frequency  2x / week    PT Treatment/Interventions  ADLs/Self Care Home Management;Cryotherapy;Ultrasound;Traction;Moist Heat;Electrical Stimulation;Iontophoresis 45m/ml Dexamethasone;Functional mobility training;Neuromuscular re-education;Balance training;Therapeutic exercise;Therapeutic activities;Patient/family education;Manual techniques    PT Next Visit Plan  progress strength. pt feels getting stronger  but no significant pain changes       Patient will benefit from skilled therapeutic intervention in order to improve the following deficits and impairments:  Improper body mechanics, Pain, Postural dysfunction, Increased muscle spasms, Decreased mobility, Decreased activity tolerance, Decreased endurance, Decreased range of motion, Decreased strength, Difficulty walking  Visit Diagnosis: 1. Acute bilateral low back pain with right-sided sciatica   2. Acute pain of right knee        Problem List Patient Active Problem List   Diagnosis Date Noted  . Bipolar I disorder, current or most recent episode depressed, in partial remission with mixed features ([place] 06/16/2018  . Chronic post-traumatic stress disorder 06/16/2018  . 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 Opioid use disorder, mild, in sustained remission ([place] 06/16/2018  . S/P cholecystectomy 02/10/2018  . S/P cervical spinal fusion 12/08/2017  . Protrusion of cervical intervertebral disc 12/08/2017  . HNP (herniated nucleus pulposus), cervical 10/02/2012    Class: Diagnosis of    RScot Jun PTA 02/23/2019, 10:57 AM  [place] [address]Phone: [phone]  Fax:  [phone] Name: Shannon LawlerMRN: [id number] of Birth: 502-03-1981

## 2019-02-25 ENCOUNTER — Other Ambulatory Visit: Payer: Self-pay

## 2019-02-25 ENCOUNTER — Ambulatory Visit: Payer: PRIVATE HEALTH INSURANCE | Admitting: Physical Therapy

## 2019-02-25 DIAGNOSIS — M25561 Pain in right knee: Secondary | ICD-10-CM

## 2019-02-25 DIAGNOSIS — M5441 Lumbago with sciatica, right side: Secondary | ICD-10-CM

## 2019-02-25 DIAGNOSIS — M25562 Pain in left knee: Secondary | ICD-10-CM

## 2019-02-25 NOTE — Therapy (Signed)
[place] [place], [place], [address] Phone: [phone]   Fax:  [phone]  Physical Therapy Treatment  Patient Details  Name: Shannon Spears MRN: [id number] Date of Birth: [birth year] Referring Provider (PT): Bishop Limbo Date: 02/25/2019  PT End of Session - 02/25/19 1050    Visit Number  8    Number of Visits  30    Date for PT Re-Evaluation  03/28/19    PT Start Time  6962    PT Stop Time  1112    PT Time Calculation (min)  57 min       Past Medical History:  Diagnosis Date  . Anxiety   . Bipolar 1 disorder, manic, mild ([place])   . CKD (chronic kidney disease)   . Depression   . Fibromyalgia   . GERD (gastroesophageal reflux disease)   . XBMWUXLK(440.1)     Past Surgical History:  Procedure Laterality Date  . ANTERIOR CERVICAL DECOMP/DISCECTOMY FUSION N/A 10/02/2012   Procedure: C4-5, C5-6 anterior cervical discectomy and fusion, allograft plate;  Surgeon: Marybelle Killings, MD;  Location: [place];  Service: Orthopedics;  Laterality: N/A;  . CYSTOGRAPHY    . LAPAROSCOPIC TOTAL HYSTERECTOMY    . OOPHORECTOMY    . TUBAL LIGATION      There were no vitals filed for this visit.  Subjective Assessment - 02/25/19 1016    Subjective  " I am okay right now" after further questioning pain in hip/back 50% better. knees no better and c/o balance issues    Currently in Pain?  Yes    Pain Score  4     Pain Location  Knee    Pain Orientation  Right;Left         [facility] PT Assessment - 02/25/19 0001      Strength   Overall Strength Comments  BIL guad 4+/5, HS 4/5, hip flex 4-/5 with pain                   [facility] Adult PT Treatment/Exercise - 02/25/19 0001      High Level Balance   High Level Balance Activities  Tandem walking;Braiding    High Level Balance Comments  marching and heel raises on foam mat- SBA      Lumbar Exercises: Aerobic   Elliptical  I 6 R 4 2 fwd/2 back    Recumbent Bike  L1 x 5 min       Lumbar Exercises: Machines for Strengthening   Cybex Lumbar Extension  black tband 2 sets 10    Cybex Knee Extension  5# 3 sets 10     Cybex Knee Flexion  15# 3 sets 10    Leg Press  40# 3 sets 10 ball squeeze    Other Lumbar Machine Exercise  seated row and lat pull 20# 2 sets 15      Knee/Hip Exercises: Standing   Other Standing Knee Exercises  hip 3 way green band on foam mat 15 each way      Cryotherapy   Number Minutes Cryotherapy  15 Minutes    Cryotherapy Location  Knee    Type of Cryotherapy  Ice pack      Electrical Stimulation   Electrical Stimulation Location  knees    Electrical Stimulation Action  premod    Electrical Stimulation Parameters  supine    Electrical Stimulation Goals  Pain  PT Short Term Goals - 02/02/19 1037      PT SHORT TERM GOAL #1   Title  indepednent iwth initial HEP    Status  Achieved        PT Long Term Goals - 02/25/19 1030      PT LONG TERM GOAL #1   Title  decrease pain 50%    Baseline  yes for backand hips but not knee    Status  Partially Met      PT LONG TERM GOAL #2   Title  increase LE strength to WNL's    Status  Partially Met      PT LONG TERM GOAL #3   Title  understand posture and body mechanics    Status  Partially Met      PT LONG TERM GOAL #4   Title  lift daughter without difficulty    Status  Partially Met            Plan - 02/25/19 1050    Clinical Impression Statement  progressing with goals paina dn strength both improving. tolerated increased reps and added balance ex- SBA needed d/t instability on dynamic surface    PT Treatment/Interventions  ADLs/Self Care Home Management;Cryotherapy;Ultrasound;Traction;Moist Heat;Electrical Stimulation;Iontophoresis 72m/ml Dexamethasone;Functional mobility training;Neuromuscular re-education;Balance training;Therapeutic exercise;Therapeutic activities;Patient/family education;Manual techniques    PT Next  Visit Plan  focus strength and balance       Patient will benefit from skilled therapeutic intervention in order to improve the following deficits and impairments:  Improper body mechanics, Pain, Postural dysfunction, Increased muscle spasms, Decreased mobility, Decreased activity tolerance, Decreased endurance, Decreased range of motion, Decreased strength, Difficulty walking  Visit Diagnosis: 1. Acute bilateral low back pain with right-sided sciatica   2. Acute pain of right knee   3. Acute pain of left knee        Problem List Patient Active Problem List   Diagnosis Date Noted  . Bipolar I disorder, current or most recent episode depressed, in partial remission with mixed features ([facility] 06/16/2018  . Chronic post-traumatic stress disorder 06/16/2018  . 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 Opioid use disorder, mild, in sustained remission ([place] 06/16/2018  . S/P cholecystectomy 02/10/2018  . S/P cervical spinal fusion 12/08/2017  . Protrusion of cervical intervertebral disc 12/08/2017  . HNP (herniated nucleus pulposus), cervical 10/02/2012    Class: Diagnosis of    ,ANGIE PTA 02/25/2019, 10:52 AM  [place] [address]Phone: [phone]  Fax:  [phone] Name: JMaisa BedingfieldMRN: [id number] of Birth: 5Jun 20, 1982

## 2019-03-02 ENCOUNTER — Other Ambulatory Visit: Payer: Self-pay

## 2019-03-02 ENCOUNTER — Encounter: Payer: Self-pay | Admitting: Physical Therapy

## 2019-03-02 ENCOUNTER — Ambulatory Visit: Payer: PRIVATE HEALTH INSURANCE | Admitting: Physical Therapy

## 2019-03-02 DIAGNOSIS — M5441 Lumbago with sciatica, right side: Secondary | ICD-10-CM

## 2019-03-02 DIAGNOSIS — M25561 Pain in right knee: Secondary | ICD-10-CM

## 2019-03-02 DIAGNOSIS — M25562 Pain in left knee: Secondary | ICD-10-CM

## 2019-03-02 NOTE — Therapy (Signed)
[place], [place], [address] Phone: [phone]   Fax:  [phone]  Physical Therapy Treatment  Patient Details  Name: Shannon Spears MRN: [id number] Date of Birth: 20-Apr-1981 Referring Provider (PT): Bishop Limbo Date: 03/02/2019  PT End of Session - 03/02/19 1010    Visit Number  9    Date for PT Re-Evaluation  03/28/19    PT Start Time  0930    PT Stop Time  1024    PT Time Calculation (min)  54 min    Activity Tolerance  Patient tolerated treatment well    Behavior During Therapy  [facility] for tasks assessed/performed       Past Medical History:  Diagnosis Date  . Anxiety   . Bipolar 1 disorder, manic, mild ([place])   . CKD (chronic kidney disease)   . Depression   . Fibromyalgia   . GERD (gastroesophageal reflux disease)   . ZTIWPYKD(983.3)     Past Surgical History:  Procedure Laterality Date  . ANTERIOR CERVICAL DECOMP/DISCECTOMY FUSION N/A 10/02/2012   Procedure: C4-5, C5-6 anterior cervical discectomy and fusion, allograft plate;  Surgeon: Shannon Killings, MD;  Location: [place];  Service: Orthopedics;  Laterality: N/A;  . CYSTOGRAPHY    . LAPAROSCOPIC TOTAL HYSTERECTOMY    . OOPHORECTOMY    . TUBAL LIGATION      There were no vitals filed for this visit.  Subjective Assessment - 03/02/19 0935    Subjective  Pt reports that she hurts from her shoulder to knee. Pt went on to list a number of things she has to do at home since her husband hurt his back. "I feel like I have undone everything"    Pertinent History  Anxiety, bipolar, ACDF    Limitations  Sitting;Lifting;Standing;Walking;House hold activities    Patient Stated Goals  have less pain    Currently in Pain?  Yes    Pain Score  10-Worst pain ever    Pain Location  --   From shoulder to her knees                      [facility] Adult PT Treatment/Exercise - 03/02/19 0001      Lumbar Exercises: Aerobic   Recumbent Bike  L1 x 5 min       Lumbar Exercises: Machines for Strengthening   Cybex Lumbar Extension  black tband 2 sets 15    Cybex Knee Extension  5# 3 sets 10     Cybex Knee Flexion  15# 3 sets 10    Leg Press  40# 3 sets 10 ball squeeze    Other Lumbar Machine Exercise  seated row and lat pull 20# 2 sets 15      Cryotherapy   Number Minutes Cryotherapy  15 Minutes    Cryotherapy Location  Knee    Type of Cryotherapy  Ice pack      Electrical Stimulation   Electrical Stimulation Location  knees    Electrical Stimulation Action  premod    Electrical Stimulation Parameters  supine    Electrical Stimulation Goals  Pain               PT Short Term Goals - 02/02/19 1037      PT SHORT TERM GOAL #1   Title  indepednent iwth initial HEP    Status  Achieved        PT Long  Term Goals - 03/02/19 0944      PT LONG TERM GOAL #3   Title  understand posture and body mechanics    Status  Achieved      PT LONG TERM GOAL #4   Title  lift daughter without difficulty    Status  Partially Met            Plan - 03/02/19 1011    Clinical Impression Statement  Despite high reports of pain she was able to complete all of today's interventions. Cues for TKE and to squeeze quads with LAQ. Postural to keep shoulders back with seated rows.    Comorbidities  ACDF, anxiety, bipolar, fibromyalgia    Stability/Clinical Decision Making  Evolving/Moderate complexity    Rehab Potential  Good    PT Frequency  2x / week    PT Treatment/Interventions  ADLs/Self Care Home Management;Cryotherapy;Ultrasound;Traction;Moist Heat;Electrical Stimulation;Iontophoresis 77m/ml Dexamethasone;Functional mobility training;Neuromuscular re-education;Balance training;Therapeutic exercise;Therapeutic activities;Patient/family education;Manual techniques    PT Next Visit Plan  focus strength and balance       Patient will benefit from skilled therapeutic intervention in order to improve the following  deficits and impairments:  Improper body mechanics, Pain, Postural dysfunction, Increased muscle spasms, Decreased mobility, Decreased activity tolerance, Decreased endurance, Decreased range of motion, Decreased strength, Difficulty walking  Visit Diagnosis: 1. Acute pain of left knee   2. Acute pain of right knee   3. Acute bilateral low back pain with right-sided sciatica        Problem List Patient Active Problem List   Diagnosis Date Noted  . Bipolar I disorder, current or most recent episode depressed, in partial remission with mixed features ([place] 06/16/2018  . Chronic post-traumatic stress disorder 06/16/2018  . 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 Opioid use disorder, mild, in sustained remission ([place] 06/16/2018  . S/P cholecystectomy 02/10/2018  . S/P cervical spinal fusion 12/08/2017  . Protrusion of cervical intervertebral disc 12/08/2017  . HNP (herniated nucleus pulposus), cervical 10/02/2012    Class: Diagnosis of    RScot Jun PTA 03/02/2019, 10:13 AM  [place] [address]Phone: [phone]  Fax:  [phone] Name: JAileene LanumMRN: [id number] of Birth: [birth year]

## 2019-03-04 ENCOUNTER — Other Ambulatory Visit: Payer: Self-pay

## 2019-03-04 ENCOUNTER — Encounter: Payer: Self-pay | Admitting: Physical Therapy

## 2019-03-04 ENCOUNTER — Ambulatory Visit: Payer: PRIVATE HEALTH INSURANCE | Admitting: Physical Therapy

## 2019-03-04 DIAGNOSIS — M25562 Pain in left knee: Secondary | ICD-10-CM

## 2019-03-04 DIAGNOSIS — M25561 Pain in right knee: Secondary | ICD-10-CM

## 2019-03-04 DIAGNOSIS — M5441 Lumbago with sciatica, right side: Secondary | ICD-10-CM

## 2019-03-04 NOTE — Therapy (Signed)
[place], [place], [address] Phone: [phone]   Fax:  [phone]  Physical Therapy Treatment  Patient Details  Name: Shannon Spears MRN: [id number] Date of Birth: 1981/03/09 Referring Provider (PT): Bishop Limbo Date: 03/04/2019  PT End of Session - 03/04/19 1009    Visit Number  10    Date for PT Re-Evaluation  03/28/19    PT Start Time  0927    PT Stop Time  1015    PT Time Calculation (min)  48 min    Activity Tolerance  Patient tolerated treatment well    Behavior During Therapy  [facility] for tasks assessed/performed       Past Medical History:  Diagnosis Date  . Anxiety   . Bipolar 1 disorder, manic, mild ([place])   . CKD (chronic kidney disease)   . Depression   . Fibromyalgia   . GERD (gastroesophageal reflux disease)   . LKTGYBWL(893.7)     Past Surgical History:  Procedure Laterality Date  . ANTERIOR CERVICAL DECOMP/DISCECTOMY FUSION N/A 10/02/2012   Procedure: C4-5, C5-6 anterior cervical discectomy and fusion, allograft plate;  Surgeon: Marybelle Killings, MD;  Location: [place];  Service: Orthopedics;  Laterality: N/A;  . CYSTOGRAPHY    . LAPAROSCOPIC TOTAL HYSTERECTOMY    . OOPHORECTOMY    . TUBAL LIGATION      There were no vitals filed for this visit.  Subjective Assessment - 03/04/19 0930    Subjective  Pt reports that she feels like she is sitting on bruises, she thinks it is from her fibromyalgia. Reports some improvement from therapy not has some set back due to her husbands recent injury and her having to do more around the house.    Limitations  Sitting;Lifting;Standing;Walking;House hold activities    Currently in Pain?  Yes    Pain Score  6     Pain Location  Knee    Pain Orientation  Left;Right                       [facility] Adult PT Treatment/Exercise - 03/04/19 0001      Lumbar Exercises: Aerobic   Elliptical  I 6 R 4 2 fwd/2 back    Nustep  L 4  6 min      Lumbar Exercises: Machines for Strengthening   Leg Press  40# 3 sets 10     Other Lumbar Machine Exercise  seated row and lat pull 20# 2 sets 15      Knee/Hip Exercises: Standing   Heel Raises  Both;2 sets;10 reps;2 seconds    Lateral Step Up  Both;1 set;10 reps;Hand Hold: 1;Step Height: 6"    Walking with Sports Cord  4 way 5 each 40#               PT Short Term Goals - 02/02/19 1037      PT SHORT TERM GOAL #1   Title  indepednent iwth initial HEP    Status  Achieved        PT Long Term Goals - 03/04/19 0939      PT LONG TERM GOAL #1   Status  Partially Met      PT LONG TERM GOAL #2   Title  increase LE strength to WNL's    Status  Partially Met      PT LONG TERM GOAL #3   Title  understand posture  and body mechanics    Status  Achieved      PT LONG TERM GOAL #4   Title  lift daughter without difficulty    Status  Partially Met            Plan - 03/04/19 1009    Clinical Impression Statement  Pt very hyper verbal throughout treatment about her medical issues in past and present.  She was able to complete all of the interventions well despite all. She reports her knees lock up when she straighten them out when standing causing her to lose her balance. Some instability with resisted side step today. No issues with resisted forward and backwards walking.    Personal Factors and Comorbidities  Comorbidity 3+    Comorbidities  ACDF, anxiety, bipolar, fibromyalgia    Stability/Clinical Decision Making  Evolving/Moderate complexity    Rehab Potential  Fair    PT Frequency  2x / week    PT Duration  8 weeks    PT Treatment/Interventions  ADLs/Self Care Home Management;Cryotherapy;Ultrasound;Traction;Moist Heat;Electrical Stimulation;Iontophoresis 86m/ml Dexamethasone;Functional mobility training;Neuromuscular re-education;Balance training;Therapeutic exercise;Therapeutic activities;Patient/family education;Manual techniques    PT Next Visit Plan   focus strength and balance       Patient will benefit from skilled therapeutic intervention in order to improve the following deficits and impairments:  Improper body mechanics, Pain, Postural dysfunction, Increased muscle spasms, Decreased mobility, Decreased activity tolerance, Decreased endurance, Decreased range of motion, Decreased strength, Difficulty walking  Visit Diagnosis: 1. Acute pain of right knee   2. Acute bilateral low back pain with right-sided sciatica   3. Acute pain of left knee        Problem List Patient Active Problem List   Diagnosis Date Noted  . Bipolar I disorder, current or most recent episode depressed, in partial remission with mixed features ([place] 06/16/2018  . Chronic post-traumatic stress disorder 06/16/2018  . 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 Opioid use disorder, mild, in sustained remission ([place] 06/16/2018  . S/P cholecystectomy 02/10/2018  . S/P cervical spinal fusion 12/08/2017  . Protrusion of cervical intervertebral disc 12/08/2017  . HNP (herniated nucleus pulposus), cervical 10/02/2012    Class: Diagnosis of    RScot Jun PTA 03/04/2019, 10:13 AM  [place] [address]Phone: [phone]  Fax:  [phone] Name: Shannon ColelloMRN: [id number] of Birth: [birth year]

## 2019-03-09 ENCOUNTER — Encounter: Payer: Self-pay | Admitting: Psychiatry

## 2019-03-09 ENCOUNTER — Ambulatory Visit (INDEPENDENT_AMBULATORY_CARE_PROVIDER_SITE_OTHER): Payer: PRIVATE HEALTH INSURANCE | Admitting: Psychiatry

## 2019-03-09 ENCOUNTER — Ambulatory Visit: Payer: PRIVATE HEALTH INSURANCE | Attending: Orthopaedic Surgery | Admitting: Physical Therapy

## 2019-03-09 ENCOUNTER — Other Ambulatory Visit: Payer: Self-pay

## 2019-03-09 VITALS — Ht 62.0 in | Wt 150.0 lb

## 2019-03-09 DIAGNOSIS — M25561 Pain in right knee: Secondary | ICD-10-CM | POA: Diagnosis not present

## 2019-03-09 DIAGNOSIS — M5441 Lumbago with sciatica, right side: Secondary | ICD-10-CM | POA: Diagnosis present

## 2019-03-09 DIAGNOSIS — F4312 Post-traumatic stress disorder, chronic: Secondary | ICD-10-CM | POA: Diagnosis not present

## 2019-03-09 DIAGNOSIS — M25562 Pain in left knee: Secondary | ICD-10-CM | POA: Diagnosis present

## 2019-03-09 DIAGNOSIS — F451 Undifferentiated somatoform disorder: Secondary | ICD-10-CM

## 2019-03-09 DIAGNOSIS — F3175 Bipolar disorder, in partial remission, most recent episode depressed: Secondary | ICD-10-CM | POA: Diagnosis not present

## 2019-03-09 MED ORDER — DIAZEPAM 10 MG PO TABS: 10.0000 mg | ORAL_TABLET | Freq: Three times a day (TID) | ORAL | 5 refills | Status: DC | PRN

## 2019-03-09 MED ORDER — LAMOTRIGINE 200 MG PO TABS: 200.0000 mg | ORAL_TABLET | Freq: Every day | ORAL | 5 refills | Status: DC
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
## 2019-03-09 NOTE — Therapy (Signed)
[place], [place], [address] Phone: [phone]   Fax:  [phone]  Physical Therapy Treatment  Patient Details  Name: Shannon Spears MRN: [id number] Date of Birth: Dec 14, 1980 Referring Provider (PT): Bishop Limbo Date: 03/09/2019  PT End of Session - 03/09/19 0953    Visit Number  11    Number of Visits  30    Date for PT Re-Evaluation  03/28/19    PT Start Time  0925    PT Stop Time  1015    PT Time Calculation (min)  50 min       Past Medical History:  Diagnosis Date  . Anxiety   . Bipolar 1 disorder, manic, mild ([place])   . CKD (chronic kidney disease)   . Depression   . Fibromyalgia   . GERD (gastroesophageal reflux disease)   . ONGEXBMW(413.2)     Past Surgical History:  Procedure Laterality Date  . ANTERIOR CERVICAL DECOMP/DISCECTOMY FUSION N/A 10/02/2012   Procedure: C4-5, C5-6 anterior cervical discectomy and fusion, allograft plate;  Surgeon: Marybelle Killings, MD;  Location: [facility];  Service: Orthopedics;  Laterality: N/A;  . CYSTOGRAPHY    . LAPAROSCOPIC TOTAL HYSTERECTOMY    . OOPHORECTOMY    . TUBAL LIGATION      There were no vitals filed for this visit.  Subjective Assessment - 03/09/19 0926    Subjective  I feel I am getting stronger. Knees not grinding as bad. Bad bone pain in knees, hips and back. I am staying active.    Currently in Pain?  Yes    Pain Score  5     Pain Location  Knee    Pain Orientation  Right;Left                       [facility] Adult PT Treatment/Exercise - 03/09/19 0001      Lumbar Exercises: Aerobic   Elliptical  I 6 R 4 2 fwd/2 back    Nustep  L 5 6 min      Knee/Hip Exercises: Standing   Lateral Step Up  Both;1 set;15 reps;Step Height: 6";Hand Hold: 0   opp leg abd- occassional LOB esp on RT   Forward Step Up  Both;1 set;15 reps;Step Height: 6";Hand Hold: 0   opp leg ext- occassional LOB esp on RT   Wall Squat  2 sets;5  reps;5 seconds    Other Standing Knee Exercises  lunge onto dyna disc 10 each   lat squat onto dyna disc 10 each   Other Standing Knee Exercises  pulley hip ex 10 each direction each leg      Knee/Hip Exercises: Seated   Sit to Sand  2 sets;10 reps;without UE support   wt ball chest press and then [facility] press     Cryotherapy   Number Minutes Cryotherapy  15 Minutes    Cryotherapy Location  Knee    Type of Cryotherapy  Ice pack      Electrical Stimulation   Electrical Stimulation Location  knees    Electrical Stimulation Action  premod    Electrical Stimulation Parameters  supine    Electrical Stimulation Goals  Pain               PT Short Term Goals - 02/02/19 1037      PT SHORT TERM GOAL #1   Title  indepednent iwth initial HEP  Status  Achieved        PT Long Term Goals - 03/04/19 0939      PT LONG TERM GOAL #1   Status  Partially Met      PT LONG TERM GOAL #2   Title  increase LE strength to WNL's    Status  Partially Met      PT LONG TERM GOAL #3   Title  understand posture and body mechanics    Status  Achieved      PT LONG TERM GOAL #4   Title  lift daughter without difficulty    Status  Partially Met            Plan - 03/09/19 0953    Clinical Impression Statement  changed up ex today and added more dynamic, combination ex. Pt with weakness in LE and hip compensations. Decreased balance esp when SLS on RT LE and RT knee "locking"    PT Treatment/Interventions  ADLs/Self Care Home Management;Cryotherapy;Ultrasound;Traction;Moist Heat;Electrical Stimulation;Iontophoresis 47m/ml Dexamethasone;Functional mobility training;Neuromuscular re-education;Balance training;Therapeutic exercise;Therapeutic activities;Patient/family education;Manual techniques    PT Next Visit Plan  focus strength and balance       Patient will benefit from skilled therapeutic intervention in order to improve the following deficits and impairments:  Improper body  mechanics, Pain, Postural dysfunction, Increased muscle spasms, Decreased mobility, Decreased activity tolerance, Decreased endurance, Decreased range of motion, Decreased strength, Difficulty walking  Visit Diagnosis: 1. Acute pain of right knee   2. Acute bilateral low back pain with right-sided sciatica   3. Acute pain of left knee        Problem List Patient Active Problem List   Diagnosis Date Noted  . Bipolar I disorder, current or most recent episode depressed, in partial remission with mixed features ([facility] 06/16/2018  . Chronic post-traumatic stress disorder 06/16/2018  . 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 Opioid use disorder, mild, in sustained remission ([facility] 06/16/2018  . S/P cholecystectomy 02/10/2018  . S/P cervical spinal fusion 12/08/2017  . Protrusion of cervical intervertebral disc 12/08/2017  . HNP (herniated nucleus pulposus), cervical 10/02/2012    Class: Diagnosis of    Shannon Spears,ANGIE PTA 03/09/2019, 10:03 AM  [address] [address]Phone: [phone]  Fax:  [phone] Name: Shannon LynamMRN: [id number] of Birth: 5November 30, 1982

## 2019-03-11 ENCOUNTER — Ambulatory Visit: Payer: PRIVATE HEALTH INSURANCE | Admitting: Physical Therapy

## 2019-03-11 ENCOUNTER — Other Ambulatory Visit: Payer: Self-pay

## 2019-03-11 DIAGNOSIS — M25562 Pain in left knee: Secondary | ICD-10-CM

## 2019-03-11 DIAGNOSIS — M5441 Lumbago with sciatica, right side: Secondary | ICD-10-CM

## 2019-03-11 DIAGNOSIS — M25561 Pain in right knee: Secondary | ICD-10-CM | POA: Diagnosis not present

## 2019-03-11 NOTE — Therapy (Signed)
[place], [place], [address] Phone: [phone]   Fax:  [phone]  Physical Therapy Treatment  Patient Details  Name: Shannon Spears MRN: [id number] Date of Birth: Mar 02, 1981 Referring Provider (PT): Bishop Limbo Date: 03/11/2019  PT End of Session - 03/11/19 1058    PT Start Time  1020    PT Stop Time  1117    PT Time Calculation (min)  57 min       Past Medical History:  Diagnosis Date  . Anxiety   . Bipolar 1 disorder, manic, mild ([place])   . CKD (chronic kidney disease)   . Depression   . Fibromyalgia   . GERD (gastroesophageal reflux disease)   . KGYJEHUD(149.7)     Past Surgical History:  Procedure Laterality Date  . ANTERIOR CERVICAL DECOMP/DISCECTOMY FUSION N/A 10/02/2012   Procedure: C4-5, C5-6 anterior cervical discectomy and fusion, allograft plate;  Surgeon: Marybelle Killings, MD;  Location: [facility];  Service: Orthopedics;  Laterality: N/A;  . CYSTOGRAPHY    . LAPAROSCOPIC TOTAL HYSTERECTOMY    . OOPHORECTOMY    . TUBAL LIGATION      There were no vitals filed for this visit.  Subjective Assessment - 03/11/19 1022    Subjective  busy last few days,knees screaming at night ( pt verb after last therapy session did not sit down until 3 pm- pt verb very busy cooking,cleaning and running)    Currently in Pain?  Yes    Pain Score  3     Pain Location  Knee    Pain Orientation  Right;Left                       [facility] Adult PT Treatment/Exercise - 03/11/19 0001      Lumbar Exercises: Aerobic   Elliptical  I 6 R 4 3 fwd/3 back      Lumbar Exercises: Machines for Strengthening   Cybex Knee Extension  10# 2 sets 10     Cybex Knee Flexion  15# 3 sets 10    Leg Press  40# 3 sets 10    with ball squeeze   Other Lumbar Machine Exercise  seated row and lat pull 25# 2 sets 15      Knee/Hip Exercises: Standing   Lateral Step Up  Both;1 set;15 reps;Step Height:  6";Hand Hold: 0   opp leg abd   Forward Step Up  Both;1 set;15 reps;Step Height: 6";Hand Hold: 0   opp leg ext   Other Standing Knee Exercises  vector stance on airex      Cryotherapy   Number Minutes Cryotherapy  15 Minutes    Cryotherapy Location  Knee    Type of Cryotherapy  Ice pack      Electrical Stimulation   Electrical Stimulation Location  knees    Electrical Stimulation Action  premod    Electrical Stimulation Parameters  supine    Electrical Stimulation Goals  Pain               PT Short Term Goals - 02/02/19 1037      PT SHORT TERM GOAL #1   Title  indepednent iwth initial HEP    Status  Achieved        PT Long Term Goals - 03/11/19 1025      PT LONG TERM GOAL #1   Title  decrease pain 50%    Baseline  yes for backand hips but not knee      PT LONG TERM GOAL #2   Title  increase LE strength to WNL's    Baseline  MMT well but weakness noted with ther ex    Status  Partially Met      PT LONG TERM GOAL #3   Title  understand posture and body mechanics    Status  Achieved      PT LONG TERM GOAL #4   Title  lift daughter without difficulty    Baseline  can do except on steps    Status  Partially Met            Plan - 03/11/19 1057    Clinical Impression Statement  increased weight and did well, less shaking noted. LOB with step up and airex vector stance    PT Treatment/Interventions  ADLs/Self Care Home Management;Cryotherapy;Ultrasound;Traction;Moist Heat;Electrical Stimulation;Iontophoresis 62m/ml Dexamethasone;Functional mobility training;Neuromuscular re-education;Balance training;Therapeutic exercise;Therapeutic activities;Patient/family education;Manual techniques    PT Next Visit Plan  focus strength and balance       Patient will benefit from skilled therapeutic intervention in order to improve the following deficits and impairments:  Improper body mechanics, Pain, Postural dysfunction, Increased muscle spasms, Decreased mobility,  Decreased activity tolerance, Decreased endurance, Decreased range of motion, Decreased strength, Difficulty walking  Visit Diagnosis: 1. Acute pain of right knee   2. Acute pain of left knee   3. Acute bilateral low back pain with right-sided sciatica        Problem List Patient Active Problem List   Diagnosis Date Noted  . Bipolar I disorder, current or most recent episode depressed, in partial remission with mixed features ([facility] 06/16/2018  . Chronic post-traumatic stress disorder 06/16/2018  . 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 Opioid use disorder, mild, in sustained remission ([facility] 06/16/2018  . S/P cholecystectomy 02/10/2018  . S/P cervical spinal fusion 12/08/2017  . Protrusion of cervical intervertebral disc 12/08/2017  . HNP (herniated nucleus pulposus), cervical 10/02/2012    Class: Diagnosis of    Yousaf Sainato,ANGIE PTA 03/11/2019, 11:01 AM  [address]ora[address] [address]Phone: [phone]  Fax:  [phone] Name: JNkenge SonntagMRN: [id number] of Birth: 510-20-1982

## 2019-03-23 ENCOUNTER — Encounter: Payer: Self-pay | Admitting: Physical Therapy

## 2019-03-23 ENCOUNTER — Ambulatory Visit: Payer: PRIVATE HEALTH INSURANCE | Admitting: Physical Therapy

## 2019-03-23 ENCOUNTER — Other Ambulatory Visit: Payer: Self-pay

## 2019-03-23 DIAGNOSIS — M25561 Pain in right knee: Secondary | ICD-10-CM | POA: Diagnosis not present

## 2019-03-23 DIAGNOSIS — M25562 Pain in left knee: Secondary | ICD-10-CM

## 2019-03-23 DIAGNOSIS — M5441 Lumbago with sciatica, right side: Secondary | ICD-10-CM
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
## 2019-03-24 ENCOUNTER — Ambulatory Visit
Admission: EM | Admit: 2019-03-24 | Discharge: 2019-03-24 | Disposition: A | Discharge status: Home / Self Care | Payer: PRIVATE HEALTH INSURANCE | Attending: Emergency Medicine | Admitting: Emergency Medicine

## 2019-03-24 ENCOUNTER — Ambulatory Visit (INDEPENDENT_AMBULATORY_CARE_PROVIDER_SITE_OTHER): Payer: PRIVATE HEALTH INSURANCE

## 2019-03-24 ENCOUNTER — Other Ambulatory Visit: Payer: Self-pay

## 2019-03-24 DIAGNOSIS — M545 Low back pain, unspecified: Secondary | ICD-10-CM

## 2019-03-24 MED ORDER — KETOROLAC TROMETHAMINE 60 MG/2ML IM SOLN
60.0000 mg | Freq: Once | INTRAMUSCULAR | Status: AC
  Administered 2019-03-24: 60 mg via INTRAMUSCULAR
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
## 2019-03-24 NOTE — ED Provider Notes (Signed)
[name]    CSN: [id number] Arrival date & time: 03/24/19  1206     History   Chief Complaint Chief Complaint  Patient presents with  . Hip Pain    HPI Shannon Spears is a 38 y.o. female with history of bipolar 1 disorder, anxiety, depression, fibromyalgia, CKD presenting for acute on chronic low back pain.  Patient states is worse on her right side since last night after falling when "wrestling her dogs ".  Denies dog bite.  Patient states that her back pain radiates "everywhere, all over ".  She voices concern she may have broken her tailbone because "I can hardly sit on it ".  Of note, patient is followed routinely by orthopedics and physical therapy; states she has an appointment tomorrow and is wondering if she could keep that due to her pain.  Patient denies saddle area anesthesia, bowel/bladder incontinence.   Past Medical History:  Diagnosis Date  . Anxiety   . Bipolar 1 disorder, manic, mild (HCC)   . CKD (chronic kidney disease)   . Depression   . Fibromyalgia   . GERD (gastroesophageal reflux disease)   . WUJWJXBJ(478.2Headache(784.0)     Patient Active Problem List   Diagnosis Date Noted  . Bipolar I disorder, current or most recent episode depressed, in partial remission with mixed features (HCC) 06/16/2018  . Chronic post-traumatic stress disorder 06/16/2018  . 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 Opioid use disorder, mild, in sustained remission (HCC) 06/16/2018  . S/P cholecystectomy 02/10/2018  . S/P cervical spinal fusion 12/08/2017  . Protrusion of cervical intervertebral disc 12/08/2017  . HNP (herniated nucleus pulposus), cervical 10/02/2012    Class: Diagnosis of    Past Surgical History:  Procedure Laterality Date  . ANTERIOR CERVICAL DECOMP/DISCECTOMY FUSION N/A 10/02/2012   Procedure: C4-5, C5-6 anterior cervical discectomy and fusion, allograft plate;  Surgeon: Eldred MangesMark C Yates, MD;  Location: [facility];  Service: Orthopedics;  Laterality: N/A;  .  CYSTOGRAPHY    . LAPAROSCOPIC TOTAL HYSTERECTOMY    . OOPHORECTOMY    . TUBAL LIGATION      OB History   No obstetric history on file.      Home Medications    Prior to Admission medications   Medication Sig Start Date End Date Taking? Authorizing Provider  albuterol (PROVENTIL) (2.5 MG/3ML) 0.083% nebulizer solution Take 3 mLs (2.5 mg total) by nebulization every 6 (six) hours as needed for wheezing or shortness of breath. 02/01/19   Bing NeighborsHarris, Kimberly S, FNP  albuterol (VENTOLIN HFA) 108 (90 Base) MCG/ACT inhaler Inhale 2 puffs into the lungs every 6 (six) hours as needed for wheezing or shortness of breath. 02/01/19   Bing NeighborsHarris, Kimberly S, FNP  cyclobenzaprine (FLEXERIL) 10 MG tablet Take 1 tablet (10 mg total) by mouth 3 (three) times daily as needed for muscle spasms. 12/24/18   Bing NeighborsHarris, Kimberly S, FNP  diazepam (VALIUM) 10 MG tablet Take 1 tablet (10 mg total) by mouth every 8 (eight) hours as needed for anxiety. 03/09/19   Chauncey MannJennings, Glenn E, MD  lamoTRIgine (LAMICTAL) 200 MG tablet Take 1 tablet (200 mg total) by mouth at bedtime. 03/09/19   Chauncey MannJennings, Glenn E, MD    Family History Family History  Problem Relation Age of Onset  . Bipolar disorder Mother   . Diabetes Mother   . Hypertension Mother   . Hypertension Father   . Colon cancer Paternal Grandmother     Social History Social History   Tobacco Use  .  Smoking status: Current Every Day Smoker    Packs/day: 0.50    Types: Cigarettes  . Smokeless tobacco: Never Used  Substance Use Topics  . Alcohol use: Yes    Comment: rarely  . Drug use: No     Allergies   Butrans [buprenorphine], Adhesive [tape], Codeine, Gabapentin, Ibuprofen, Penicillins, Tetanus toxoids, and Doxycycline   Review of Systems Review of Systems  Constitutional: Negative for fatigue and fever.  Respiratory: Negative for cough and shortness of breath.   Cardiovascular: Negative for chest pain and palpitations.  Musculoskeletal:       Positive  for gait abnormality, right-sided low back/hip/knee pain Negative for weakness, numbness  Neurological: Negative for weakness and numbness.     Physical Exam Triage Vital Signs ED Triage Vitals  Enc Vitals Group     BP 03/24/19 1216 126/76     Pulse Rate 03/24/19 1216 (!) 119     Resp 03/24/19 1216 18     Temp 03/24/19 1216 97.9 F (36.6 C)     Temp Source 03/24/19 1216 Oral     SpO2 03/24/19 1216 99 %     Weight --      Height --      Head Circumference --      Peak Flow --      Pain Score 03/24/19 1217 10     Pain Loc --      Pain Edu? --      Excl. in GC? --    No data found.  Updated Vital Signs BP 126/76 (BP Location: Left Arm)   Pulse (!) 119   Temp 97.9 F (36.6 C) (Oral)   Resp 18   LMP 10/02/2012   SpO2 99%   Visual Acuity Right Eye Distance:   Left Eye Distance:   Bilateral Distance:    Right Eye Near:   Left Eye Near:    Bilateral Near:     Physical Exam Constitutional:      General: She is not in acute distress. HENT:     Head: Normocephalic and atraumatic.  Eyes:     General: No scleral icterus.    Conjunctiva/sclera: Conjunctivae normal.     Pupils: Pupils are equal, round, and reactive to light.  Cardiovascular:     Rate and Rhythm: Normal rate.  Pulmonary:     Effort: Pulmonary effort is normal. No respiratory distress.  Musculoskeletal:     Comments: Patient sitting on exam table with most of her weight in her left hip.  Patient is able to bear weight though gait is antalgic.  Full passive range of motion of both hips and knees.  Decrease strength in right lower extremity second to patient cooperation/pain.  Overall, exam is severely limited second to patient's apprehension, cooperation.  Neurological:     General: No focal deficit present.     Mental Status: She is alert.      UC Treatments / Results  Labs (all labs ordered are listed, but only abnormal results are displayed) Labs Reviewed - No data to display  EKG    Radiology Dg Lumbar Spine 2-3 Views  Result Date: 03/24/2019 CLINICAL DATA:  Several falls recently with low back pain, initial encounter EXAM: LUMBAR SPINE - 3 VIEW COMPARISON:  12/29/2018 FINDINGS: Five lumbar vertebra are well visualized. Vertebral body height is well maintained. Mild osteophytic changes are seen. No acute fracture is noted. No soft tissue abnormality is seen. IMPRESSION: Mild degenerative change without acute abnormality. Electronically Signed  By: Inez Catalina M.D.   On: 03/24/2019 13:26    Procedures Procedures (including critical care time)  Medications Ordered in UC Medications  ketorolac (TORADOL) injection 60 mg (60 mg Intramuscular Given 03/24/19 1344)    Initial Impression / Assessment and Plan / UC Course  I have reviewed the triage vital signs and the nursing notes.  Pertinent labs & imaging results that were available during my care of the patient were reviewed by me and considered in my medical decision making (see chart for details).     1.  Right-sided low back pain Acute on chronic: Exacerbated by recent fall.  No head trauma, LOC.  Lumbar spine x-ray done in office, reviewed by me: Mild degenerative change without acute abnormality.  Relayed results to patient, provided IM Toradol injection for pain relief which patient tolerated well.  Encourage patient to keep her appointment with PT, patient has appointment scheduled with PCP tomorrow and encouraged her to keep that as well.  Will continue current pain management at home and discussed that patient may need to be consulted by pain management clinic.  Return precautions discussed, patient verbalized understanding and is agreeable to plan. Final Clinical Impressions(s) / UC Diagnoses   Final diagnoses:  Right-sided low back pain without sciatica, unspecified chronicity     Discharge Instructions     Continue physical therapy appointments. Follow-up with your orthopedic provider and primary care.  Return for worsening pain, numbness or tingling in your feet, bowel or bladder incontinence.    ED Prescriptions    None     Controlled Substance Prescriptions [place] Controlled Substance Registry consulted? Yes, I have consulted the [place] Controlled Substances Registry for this patient.   Hall[place], [place], [place] 03/25/19 1539

## 2019-03-24 NOTE — Discharge Instructions (Signed)
Continue physical therapy appointments. Follow-up with your orthopedic provider and primary care. Return for worsening pain, numbness or tingling in your feet, bowel or bladder incontinence.

## 2019-03-25 ENCOUNTER — Encounter: Payer: Self-pay | Admitting: Emergency Medicine

## 2019-03-25 ENCOUNTER — Encounter: Payer: Self-pay | Admitting: Family Medicine

## 2019-03-25 ENCOUNTER — Ambulatory Visit (INDEPENDENT_AMBULATORY_CARE_PROVIDER_SITE_OTHER): Payer: PRIVATE HEALTH INSURANCE | Admitting: Family Medicine

## 2019-03-25 DIAGNOSIS — M5441 Lumbago with sciatica, right side: Secondary | ICD-10-CM

## 2019-03-25 DIAGNOSIS — G8929 Other chronic pain: Secondary | ICD-10-CM | POA: Diagnosis not present

## 2019-03-25 DIAGNOSIS — M5442 Lumbago with sciatica, left side: Secondary | ICD-10-CM | POA: Diagnosis not present

## 2019-03-25 MED ORDER — TRAMADOL HCL 50 MG PO TABS: 50.0000 mg | ORAL_TABLET | Freq: Two times a day (BID) | ORAL | 0 refills | Status: DC | PRN

## 2019-03-25 MED ORDER — CYCLOBENZAPRINE HCL 10 MG PO TABS: 10.0000 mg | ORAL_TABLET | Freq: Three times a day (TID) | ORAL | 1 refills | Status: DC | PRN

## 2019-03-25 NOTE — Progress Notes (Signed)
Has c/o lower back pain x 1.5 weeks. Describes pain as sharp, stabbing, throbbing pain. Pain is constant & worsens with movement. Started after having to lift her grandmother multiple times & 2 falls. Did end up going to urgent care yesterday & xrays were fine.

## 2019-03-25 NOTE — Progress Notes (Signed)
Virtual Visit via Telephone Note  I connected with Shannon Spears, on 03/25/2019 at 8:37 AM by telephone due to the COVID-19 pandemic and verified that I am speaking with the correct person using two identifiers.   Consent: I discussed the limitations, risks, security and privacy concerns of performing an evaluation and management service by telephone and the availability of in person appointments. I also discussed with the patient that there may be a patient responsible charge related to this service. The patient expressed understanding and agreed to proceed.   Location of Patient: Home  Location of Provider: Clinic   Persons participating in Telemedicine visit: Kyliah Deanda -CMA Dr. Felecia Shelling     History of Present Illness: 38 year old female with a h/o Bipolar disorder, chronic low back pain with Sciatica who is seen for an acute visit complaining of worsening low back pain for the last 1 week for which she was seen at urgent care and a shot of Toradol provided which provided no relief. Xray revealed mild degenerative changes without acute abnormality.  This started when she had to lift her Grandma off the bed in the ED and shortly after she has had three incidences of either pulling after a heavy dog or being dragged by the do on the floor and she breaks down crying. Pain occurs in mid back, both knees and is worse with walking. She is currently undergoing PT for Sciatica but had to cancel due to low back pain. She is on chronic Diazepam which she receives from her Psychiatrist for Anxiety and has left over Flexeril which have not helped much.   Past Medical History:  Diagnosis Date  . Anxiety   . Bipolar 1 disorder, manic, mild ([place])   . CKD (chronic kidney disease)   . Depression   . Fibromyalgia   . GERD (gastroesophageal reflux disease)   . Headache(784.0)    Allergies  Allergen Reactions  . Butrans [Buprenorphine] Shortness Of Breath and Rash  . Adhesive  [Tape]   . Codeine     Mouth went numb  . Gabapentin Nausea And Vomiting  . Ibuprofen     Has kidney disease.  Marland Kitchen Penicillins Nausea And Vomiting and Swelling  . Tetanus Toxoids     Blister on legs  . Doxycycline Rash    Current Outpatient Medications on File Prior to Visit  Medication Sig Dispense Refill  . albuterol (PROVENTIL) (2.5 MG/3ML) 0.083% nebulizer solution Take 3 mLs (2.5 mg total) by nebulization every 6 (six) hours as needed for wheezing or shortness of breath. 150 mL 1  . albuterol (VENTOLIN HFA) 108 (90 Base) MCG/ACT inhaler Inhale 2 puffs into the lungs every 6 (six) hours as needed for wheezing or shortness of breath. 8 g 2  . cyclobenzaprine (FLEXERIL) 10 MG tablet Take 1 tablet (10 mg total) by mouth 3 (three) times daily as needed for muscle spasms. 60 tablet 1  . diazepam (VALIUM) 10 MG tablet Take 1 tablet (10 mg total) by mouth every 8 (eight) hours as needed for anxiety. 90 tablet 5  . famotidine (PEPCID AC MAXIMUM STRENGTH) 20 MG tablet Take 20 mg by mouth daily.    . Lactobacillus (PROBIOTIC ACIDOPHILUS PO) Take 1 capsule by mouth daily.    Marland Kitchen lamoTRIgine (LAMICTAL) 200 MG tablet Take 1 tablet (200 mg total) by mouth at bedtime. 30 tablet 5   No current facility-administered medications on file prior to visit.     Observations/Objective: Alert, awake, oriented Sounds to  be in distress  Assessment and Plan: 1. Chronic bilateral low back pain with bilateral sciatica Acute on chronic Continue Flexeril Tramadol added - reviewed PDMP Apply heat Continue PT next week and if pain is worsening, will consider referral back to her back specialist Dr Ophelia CharterYates. - traMADol (ULTRAM) 50 MG tablet; Take 1 tablet (50 mg total) by mouth every 12 (twelve) hours as needed.  Dispense: 30 tablet; Refill: 0   Follow Up Instructions: 3 weeks   I discussed the assessment and treatment plan with the patient. The patient was provided an opportunity to ask questions and all were  answered. The patient agreed with the plan and demonstrated an understanding of the instructions.   The patient was advised to call back or seek an in-person evaluation if the symptoms worsen or if the condition fails to improve as anticipated.     I provided 15minutes total of non-face-to-face time during this encounter including median intraservice time, reviewing previous notes, labs, imaging, medications, management and patient verbalized understanding.     Hoy RegisterEnobong Basha Krygier, MD, FAAFP. [facility] [place], [place] [phone]   03/25/2019, 8:37 AM

## 2019-03-26 ENCOUNTER — Ambulatory Visit: Payer: PRIVATE HEALTH INSURANCE | Admitting: Physical Therapy

## 2019-03-30 ENCOUNTER — Telehealth: Payer: Self-pay | Admitting: Family Medicine

## 2019-03-30 ENCOUNTER — Ambulatory Visit: Payer: PRIVATE HEALTH INSURANCE | Admitting: Physical Therapy

## 2019-03-30 ENCOUNTER — Other Ambulatory Visit: Payer: Self-pay

## 2019-03-30 DIAGNOSIS — M25561 Pain in right knee: Secondary | ICD-10-CM | POA: Diagnosis not present

## 2019-03-30 DIAGNOSIS — M5441 Lumbago with sciatica, right side: Secondary | ICD-10-CM

## 2019-03-30 NOTE — Therapy (Signed)
[facility] [address] [place], [place], [address] Phone: [phone]   Fax:  [phone]  Physical Therapy Treatment  Patient Details  Name: Shannon Spears MRN: [id number] Date of Birth: 01/07/1981 Referring Provider (PT): Yates   Encounter Date: 03/30/2019  PT End of Session - 03/30/19 1037    Visit Number  14    Number of Visits  30    Date for PT Re-Evaluation  03/28/19    PT Start Time  1022    PT Stop Time  1055    PT Time Calculation (min)  33 min       Past Medical History:  Diagnosis Date  . Anxiety   . Bipolar 1 disorder, manic, mild (HCC)   . CKD (chronic kidney disease)   . Depression   . Fibromyalgia   . GERD (gastroesophageal reflux disease)   . Headache(784.0)     Past Surgical History:  Procedure Laterality Date  . ANTERIOR CERVICAL DECOMP/DISCECTOMY FUSION N/A 10/02/2012   Procedure: C4-5, C5-6 anterior cervical discectomy and fusion, allograft plate;  Surgeon: Mark C Yates, MD;  Location: [facility] OR;  Service: Orthopedics;  Laterality: N/A;  . CYSTOGRAPHY    . LAPAROSCOPIC TOTAL HYSTERECTOMY    . OOPHORECTOMY    . TUBAL LIGATION      There were no vitals filed for this visit.  Subjective Assessment - 03/30/19 1023    Subjective  7 min late. antalgic gait, listed over to left and minimal weight thru RT LE. pt crying    Currently in Pain?  Yes    Pain Score  10-Worst pain ever    Pain Location  Back    Pain Orientation  Right;Left                       [facility] Adult PT Treatment/Exercise - 03/30/19 0001      Moist Heat Therapy   Number Minutes Moist Heat  15 Minutes    Moist Heat Location  Lumbar Spine;Knee      Electrical Stimulation   Electrical Stimulation Location  LB    Electrical Stimulation Action  IFC    Electrical Stimulation Parameters  supine    Electrical Stimulation Goals  Pain      Manual Therapy   Manual Therapy  Passive ROM    Manual therapy comments   attempted gentle PROM and stretch to RT LE and back but pt pain limited and crying.                PT Short Term Goals - 02/02/19 1037      PT SHORT TERM GOAL #1   Title  indepednent iwth initial HEP    Status  Achieved        PT Long Term Goals - 03/23/19 1002      PT LONG TERM GOAL #1   Title  decrease pain 50%    Status  Partially Met            Plan - 03/30/19 1037    Clinical Impression Statement  pt arrived with increased RT LE and back pain, verb has been to urgent care and phone call with MD since last session. increased pain meds and spasm meds but no changes. pt could not tolerate any ex or MT.    PT Treatment/Interventions  ADLs/Self Care Home Management;Cryotherapy;Ultrasound;Traction;Moist Heat;Electrical Stimulation;Iontophoresis 4mg/ml Dexamethasone;Functional mobility training;Neuromuscular re-education;Balance training;Therapeutic exercise;Therapeutic activities;Patient/family education;Manual techniques      PT Next Visit Plan  HOLD until sees MD. Renewal done       Patient will benefit from skilled therapeutic intervention in order to improve the following deficits and impairments:  Improper body mechanics, Pain, Postural dysfunction, Increased muscle spasms, Decreased mobility, Decreased activity tolerance, Decreased endurance, Decreased range of motion, Decreased strength, Difficulty walking  Visit Diagnosis: Acute pain of right knee  Acute bilateral low back pain with right-sided sciatica     Problem List Patient Active Problem List   Diagnosis Date Noted  . Bipolar I disorder, current or most recent episode depressed, in partial remission with mixed features (HCC) 06/16/2018  . Chronic post-traumatic stress disorder 06/16/2018  . 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 Opioid use disorder, mild, in sustained remission (HCC) 06/16/2018  . S/P cholecystectomy 02/10/2018  . S/P cervical spinal fusion 12/08/2017  . Protrusion of cervical  intervertebral disc 12/08/2017  . HNP (herniated nucleus pulposus), cervical 10/02/2012    Class: Diagnosis of    PAYSEUR,ANGIE PTA 03/30/2019, 10:42 AM  [facility] [address] [place], [place], [address] Phone: [phone]   Fax:  [phone]  Name: Shannon Spears MRN: [id number] Date of Birth: 11/10/1980   

## 2019-03-30 NOTE — Telephone Encounter (Addendum)
Patient called to request a new referral to her orthopedics office. -Marga Hoots [phone]  called the office to confirm she has already established care at that clinic and she agreed to follow up with her ortho specialist

## 2019-04-01 ENCOUNTER — Ambulatory Visit: Payer: PRIVATE HEALTH INSURANCE | Admitting: Physical Therapy

## 2019-04-06 ENCOUNTER — Encounter: Payer: Self-pay | Admitting: Orthopaedic Surgery

## 2019-04-06 ENCOUNTER — Ambulatory Visit (INDEPENDENT_AMBULATORY_CARE_PROVIDER_SITE_OTHER): Payer: PRIVATE HEALTH INSURANCE | Admitting: Orthopaedic Surgery

## 2019-04-06 ENCOUNTER — Ambulatory Visit: Payer: Self-pay

## 2019-04-06 VITALS — BP 117/85 | HR 107 | Ht 61.0 in | Wt 150.0 lb

## 2019-04-06 DIAGNOSIS — M25561 Pain in right knee: Secondary | ICD-10-CM

## 2019-04-06 DIAGNOSIS — S8001XA Contusion of right knee, initial encounter: Secondary | ICD-10-CM

## 2019-04-06 DIAGNOSIS — S8000XA Contusion of unspecified knee, initial encounter: Secondary | ICD-10-CM | POA: Insufficient documentation

## 2019-04-06 NOTE — Progress Notes (Signed)
Office Visit Note   Patient: Shannon Spears           Date of Birth: 09-Sep-1980           MRN: [id number] Visit Date: 04/06/2019              Requested by: Bing NeighborsHarris, Kimberly S, FNP [address] [place],  [place] [id number] PCP: Bing NeighborsHarris, Kimberly S, FNP   Assessment & Plan: Visit Diagnoses:  1. Acute pain of right knee   2. Contusion of right knee, initial encounter     Plan: We discussed working on ice topical cream anti-inflammatories and avoiding narcotic medication.  We discussed fall prevention as it pertains to dog walking etc.  X-ray results were reviewed.  No evidence of meniscal or ligamentous injury from her fall on exam.  She can follow-up PRN. PDMP reviewed.   Follow-Up Instructions: Return if symptoms worsen or fail to improve.   Orders:  Orders Placed This Encounter  Procedures   XR KNEE 3 VIEW RIGHT   No orders of the defined types were placed in this encounter.     Procedures: No procedures performed   Clinical Data: No additional findings.   Subjective: Chief Complaint  Patient presents with   Lower Back - Pain   Right Knee - Pain    HPI 38 year old female returns she is continuing to have some pain in the middle of her back and also her knees.  She states she was in the emergency department with her grandmother while back and had to continue to get her up and onto the bed multiple times.  She describes a dog jumping on her on her left side and she fell on her right side hit her low back on the entertainment center.  2 weeks ago after physical therapy she was taking the dog out another dog lunged while on the leash jerking her down and dragging her down the driveway.  The next an [place] bulldog came upstairs she had rest of the dog to keep the dog from attacking the [place].  Patient states anti-inflammatory had worked and she states she got some tramadol and Flexeril from her PCP.  States her right knee is painful she occasionally is been  walking with a limp.  States she has muscle spasms in her right knee and it bothers her when she is driving.  She has been making good progress she feels with therapy.  Lumbar x-rays 03/24/2019 showed some mild degenerative changes.  Review of Systems 14 point review of systems updated unchanged from 12/08/2017 other than as mentioned HPI.   Objective: Vital Signs: BP 117/85    Pulse (!) 107    Ht 5\' 1"  (1.549 m)    Wt 150 lb (68 kg)    LMP 10/02/2012    BMI 28.34 kg/m   Physical Exam Constitutional:      Appearance: She is well-developed.  HENT:     Head: Normocephalic.     Right Ear: External ear normal.     Left Ear: External ear normal.  Eyes:     Pupils: Pupils are equal, round, and reactive to light.  Neck:     Thyroid: No thyromegaly.     Trachea: No tracheal deviation.  Cardiovascular:     Rate and Rhythm: Normal rate.  Pulmonary:     Effort: Pulmonary effort is normal.  Abdominal:     Palpations: Abdomen is soft.  Skin:    General: Skin is warm and  dry.  Neurological:     Mental Status: She is alert and oriented to person, place, and time.  Psychiatric:        Behavior: Behavior normal.     Ortho Exam patient has tenderness over the medial aspect of her right knee.  Collateral ligaments are stable office ACL PCL exam both knees are normal negative logroll to the hips.  Mild sciatic notch tenderness both right and left.  Minimal trochanteric bursal tenderness negative Faber test.  Reflexes are 2+ and symmetrical no rash over exposed skin no lower extremity edema.  Quad strength is good patellar tendons are normal with good patellar tracking.  Specialty Comments:  No specialty comments available.  Imaging: No results found.   PMFS History: Patient Active Problem List   Diagnosis Date Noted   Knee contusion 04/06/2019   Bipolar I disorder, current or most recent episode depressed, in partial remission with mixed features ([place]) 06/16/2018   Chronic  post-traumatic stress disorder 06/16/2018   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   Opioid use disorder, mild, in sustained remission ([place]) 06/16/2018   S/P cholecystectomy 02/10/2018   S/P cervical spinal fusion 12/08/2017   Protrusion of cervical intervertebral disc 12/08/2017   HNP (herniated nucleus pulposus), cervical 10/02/2012    Class: Diagnosis of   Past Medical History:  Diagnosis Date   Anxiety    Bipolar 1 disorder, manic, mild (HCC)    CKD (chronic kidney disease)    Depression    Fibromyalgia    GERD (gastroesophageal reflux disease)    Headache(784.0)     Family History  Problem Relation Age of Onset   Bipolar disorder Mother    Diabetes Mother    Hypertension Mother    Hypertension Father    Colon cancer Paternal Grandmother     Past Surgical History:  Procedure Laterality Date   ANTERIOR CERVICAL DECOMP/DISCECTOMY FUSION N/A 10/02/2012   Procedure: C4-5, C5-6 anterior cervical discectomy and fusion, allograft plate;  Surgeon: Marybelle Killings, MD;  Location: [facility];  Service: Orthopedics;  Laterality: N/A;   CYSTOGRAPHY     LAPAROSCOPIC TOTAL HYSTERECTOMY     OOPHORECTOMY     TUBAL LIGATION     Social History   Occupational History   Not on file  Tobacco Use   Smoking status: Current Every Day Smoker    Packs/day: 0.50    Types: Cigarettes   Smokeless tobacco: Never Used  Substance and Sexual Activity   Alcohol use: Yes    Comment: rarely   Drug use: No   Sexual activity: Not on file

## 2019-04-15 ENCOUNTER — Ambulatory Visit: Payer: PRIVATE HEALTH INSURANCE | Admitting: Family Medicine

## 2019-04-15 ENCOUNTER — Ambulatory Visit (INDEPENDENT_AMBULATORY_CARE_PROVIDER_SITE_OTHER): Payer: PRIVATE HEALTH INSURANCE | Admitting: Family Medicine

## 2019-04-15 DIAGNOSIS — M5442 Lumbago with sciatica, left side: Secondary | ICD-10-CM | POA: Diagnosis not present

## 2019-04-15 DIAGNOSIS — M5441 Lumbago with sciatica, right side: Secondary | ICD-10-CM

## 2019-04-15 DIAGNOSIS — M25561 Pain in right knee: Secondary | ICD-10-CM

## 2019-04-15 DIAGNOSIS — G8929 Other chronic pain: Secondary | ICD-10-CM | POA: Diagnosis not present

## 2019-04-15 NOTE — Progress Notes (Signed)
Virtual Visit via Telephone Note  I connected with Shannon Spears, on 04/15/2019 at 10:03 AM by telephone due to the COVID-19 pandemic and verified that I am speaking with the correct person using two identifiers.   Consent: I discussed the limitations, risks, security and privacy concerns of performing an evaluation and management service by telephone and the availability of in person appointments. I also discussed with the patient that there may be a patient responsible charge related to this service. The patient expressed understanding and agreed to proceed.   Location of Patient: Home  Location of Provider: Clinic   Persons participating in Telemedicine visit: Rion Schnitzer [facility] Dr. Felecia Shelling     History of Present Illness: Shannon Spears is a 38 year old female with Bipolar disorder seen for a follow up of low back pain and right knee pain after lifting her Grandmother a month ago and also being dragged by dogs on a couple of ocassions Seen by Orthopedics, Dr Lorin Mercy last week and recommendations include ice, anti inflammatories, avoiding narcotics. She last had PT on 03/30/19 and has been unable to afford it since then. Right knee and back are 'killing her' but not anything she cannot usually handle. Sore all over this morning. It is also worse when she has a burst of energy and has been doing a lot of cleaning. Continues to use Flexeril; med list indicates NSAID allergy and she was prescribed Tramadol at last visit.  Past Medical History:  Diagnosis Date  . Anxiety   . Bipolar 1 disorder, manic, mild ([place])   . CKD (chronic kidney disease)   . Depression   . Fibromyalgia   . GERD (gastroesophageal reflux disease)   . Headache(784.0)    Allergies  Allergen Reactions  . Butrans [Buprenorphine] Shortness Of Breath and Rash  . Adhesive [Tape]   . Codeine     Mouth went numb  . Gabapentin Nausea And Vomiting  . Ibuprofen     Has kidney disease.  Marland Kitchen Penicillins Nausea  And Vomiting and Swelling  . Tetanus Toxoids     Blister on legs  . Doxycycline Rash    Current Outpatient Medications on File Prior to Visit  Medication Sig Dispense Refill  . albuterol (PROVENTIL) (2.5 MG/3ML) 0.083% nebulizer solution Take 3 mLs (2.5 mg total) by nebulization every 6 (six) hours as needed for wheezing or shortness of breath. 150 mL 1  . albuterol (VENTOLIN HFA) 108 (90 Base) MCG/ACT inhaler Inhale 2 puffs into the lungs every 6 (six) hours as needed for wheezing or shortness of breath. 8 g 2  . cyclobenzaprine (FLEXERIL) 10 MG tablet Take 1 tablet (10 mg total) by mouth 3 (three) times daily as needed for muscle spasms. 60 tablet 1  . diazepam (VALIUM) 10 MG tablet Take 1 tablet (10 mg total) by mouth every 8 (eight) hours as needed for anxiety. 90 tablet 5  . famotidine (PEPCID AC MAXIMUM STRENGTH) 20 MG tablet Take 20 mg by mouth daily.    . Lactobacillus (PROBIOTIC ACIDOPHILUS PO) Take 1 capsule by mouth daily.    Marland Kitchen lamoTRIgine (LAMICTAL) 200 MG tablet Take 1 tablet (200 mg total) by mouth at bedtime. 30 tablet 5  . traMADol (ULTRAM) 50 MG tablet Take 1 tablet (50 mg total) by mouth every 12 (twelve) hours as needed. 30 tablet 0   No current facility-administered medications on file prior to visit.     Observations/Objective: Alert, awake, oriented x3 Not in acute distress  Assessment and  Plan: 1. Chronic bilateral low back pain with bilateral sciatica Uncontrolled with flares depending on activity Unable to continue PT due to cost Continue home exercises Med list reveals NSAID allergy Will try topical NSAID in the event that Flexeril does not help but will hold off on refilling Tramadol Apply ice or heat whichever is beneficial to her  2. Chronic pain of right knee See #1 above   Follow Up Instructions: Return if symptoms worsen or fail to improve.    I discussed the assessment and treatment plan with the patient. The patient was provided an  opportunity to ask questions and all were answered. The patient agreed with the plan and demonstrated an understanding of the instructions.   The patient was advised to call back or seek an in-person evaluation if the symptoms worsen or if the condition fails to improve as anticipated.     I provided 11 minutes total of non-face-to-face time during this encounter including median intraservice time, reviewing previous notes, labs, imaging, medications, management and patient verbalized understanding.     Hoy RegisterEnobong Zeidy Tayag, MD, FAAFP. [facility] [place], [place] [phone]   04/15/2019, 10:03 AM

## 2019-04-15 NOTE — Progress Notes (Signed)
Following up on back pain. Has been seen by Ortho since last visit.  Patient states that she has good days when she rests & bad days when she is moving about.  Hasn't had any additional falls.

## 2019-06-21 ENCOUNTER — Ambulatory Visit
Admission: EM | Admit: 2019-06-21 | Discharge: 2019-06-21 | Disposition: A | Discharge status: Home / Self Care | Payer: PRIVATE HEALTH INSURANCE | Attending: Emergency Medicine | Admitting: Emergency Medicine

## 2019-06-21 ENCOUNTER — Other Ambulatory Visit: Payer: Self-pay | Admitting: Family Medicine

## 2019-06-21 ENCOUNTER — Encounter: Payer: Self-pay | Admitting: Emergency Medicine

## 2019-06-21 ENCOUNTER — Other Ambulatory Visit: Payer: Self-pay

## 2019-06-21 DIAGNOSIS — G8929 Other chronic pain: Secondary | ICD-10-CM | POA: Diagnosis not present

## 2019-06-21 DIAGNOSIS — M25532 Pain in left wrist: Secondary | ICD-10-CM

## 2019-06-21 MED ORDER — CYCLOBENZAPRINE HCL 10 MG PO TABS: 10.0000 mg | ORAL_TABLET | Freq: Three times a day (TID) | ORAL | 1 refills | Status: DC | PRN

## 2019-06-21 MED ORDER — KETOROLAC TROMETHAMINE 30 MG/ML IJ SOLN
30.0000 mg | Freq: Once | INTRAMUSCULAR | Status: AC
  Administered 2019-06-21: 13:00:00 30 mg via INTRAMUSCULAR

## 2019-06-21 NOTE — Discharge Instructions (Signed)

## 2019-06-21 NOTE — ED Triage Notes (Signed)
Pt presents to [facility] for assessment after 5 falls in the last few months, states they are all trip and falls.  Patient states she was mopping Thursday of last week and slipped and fell backwards.  Caught herself with her hands behind her, and is c/o mid back pain, and posterior back pain, and left wrist pain (hx of fracture).  Patient states hx of fibromyalgia as well.

## 2019-06-21 NOTE — ED Provider Notes (Signed)
[name]    CSN: [id number] Arrival date & time: 06/21/19  1137      History   Chief Complaint Chief Complaint  Patient presents with  . Fall    HPI Shannon Spears is a 38 y.o. female with history of BPD type I, CKD, depression, fibromyalgia, chronic pain presenting for persistent/stable mid to low back pain, and new left wrist pain.  Patient resting history of fracture (2016) in affected wrist which she hurt after tripping and catching herself.  Patient denies head trauma, LOC.  Patient followed by PCP, Ortho routinely who manage chronic back pain.  Denies saddle area anesthesia, change in bowel/bladder.  Patient has tried wrist compression brace and muscle relaxer with some improvement of symptoms.   Past Medical History:  Diagnosis Date  . Anxiety   . Bipolar 1 disorder, manic, mild ([facility])   . CKD (chronic kidney disease)   . Depression   . Fibromyalgia   . GERD (gastroesophageal reflux disease)   . ZRAQTMAU(633.3)     Patient Active Problem List   Diagnosis Date Noted  . Knee contusion 04/06/2019  . Bipolar I disorder, current or most recent episode depressed, in partial remission with mixed features ([facility]) 06/16/2018  . Chronic post-traumatic stress disorder 06/16/2018  . 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 Opioid use disorder, mild, in sustained remission ([facility]) 06/16/2018  . S/P cholecystectomy 02/10/2018  . S/P cervical spinal fusion 12/08/2017  . Protrusion of cervical intervertebral disc 12/08/2017  . HNP (herniated nucleus pulposus), cervical 10/02/2012    Class: Diagnosis of    Past Surgical History:  Procedure Laterality Date  . ANTERIOR CERVICAL DECOMP/DISCECTOMY FUSION N/A 10/02/2012   Procedure: C4-5, C5-6 anterior cervical discectomy and fusion, allograft plate;  Surgeon: Eldred Manges, MD;  Location: [facility] OR;  Service: Orthopedics;  Laterality: N/A;  . CYSTOGRAPHY    . LAPAROSCOPIC TOTAL HYSTERECTOMY    . OOPHORECTOMY    . TUBAL LIGATION       OB History   No obstetric history on file.      Home Medications    Prior to Admission medications   Medication Sig Start Date End Date Taking? Authorizing Provider  diazepam (VALIUM) 10 MG tablet Take 1 tablet (10 mg total) by mouth every 8 (eight) hours as needed for anxiety. 03/09/19  Yes Chauncey Mann, MD  albuterol (PROVENTIL) (2.5 MG/3ML) 0.083% nebulizer solution Take 3 mLs (2.5 mg total) by nebulization every 6 (six) hours as needed for wheezing or shortness of breath. 02/01/19   Bing Neighbors, FNP  albuterol (VENTOLIN HFA) 108 (90 Base) MCG/ACT inhaler Inhale 2 puffs into the lungs every 6 (six) hours as needed for wheezing or shortness of breath. 02/01/19   Bing Neighbors, FNP  cyclobenzaprine (FLEXERIL) 10 MG tablet Take 1 tablet (10 mg total) by mouth 3 (three) times daily as needed for muscle spasms. 03/25/19   Hoy Register, MD  famotidine (PEPCID AC MAXIMUM STRENGTH) 20 MG tablet Take 20 mg by mouth daily.    [provider]  Lactobacillus (PROBIOTIC ACIDOPHILUS PO) Take 1 capsule by mouth daily.    [provider]  lamoTRIgine (LAMICTAL) 200 MG tablet Take 1 tablet (200 mg total) by mouth at bedtime. 03/09/19   Chauncey Mann, MD  traMADol (ULTRAM) 50 MG tablet Take 1 tablet (50 mg total) by mouth every 12 (twelve) hours as needed. 03/25/19   Hoy Register, MD    Family History Family History  Problem Relation  Age of Onset  . Bipolar disorder Mother   . Diabetes Mother   . Hypertension Mother   . Hypertension Father   . Colon cancer Paternal Grandmother     Social History Social History   Tobacco Use  . Smoking status: Current Every Day Smoker    Packs/day: 0.50    Types: Cigarettes  . Smokeless tobacco: Never Used  Substance Use Topics  . Alcohol use: Yes    Comment: rarely  . Drug use: No     Allergies   Butrans [buprenorphine], Adhesive [tape], Codeine, Gabapentin, Ibuprofen, Penicillins, Tetanus toxoids, and  Doxycycline   Review of Systems Review of Systems  Constitutional: Negative for fatigue and fever.  Respiratory: Negative for cough and shortness of breath.   Cardiovascular: Negative for chest pain and palpitations.  Musculoskeletal: Positive for back pain. Negative for neck pain and neck stiffness.       Positive for left wrist pain  Neurological: Negative for weakness and numbness.     Physical Exam Triage Vital Signs ED Triage Vitals  Enc Vitals Group     BP      Pulse      Resp      Temp      Temp src      SpO2      Weight      Height      Head Circumference      Peak Flow      Pain Score      Pain Loc      Pain Edu?      Excl. in GC?    No data found.  Updated Vital Signs BP 124/85 (BP Location: Right Arm)   Pulse 97   Temp 98.9 F (37.2 C) (Temporal)   Resp 18   LMP 10/02/2012   SpO2 97%   Visual Acuity Right Eye Distance:   Left Eye Distance:   Bilateral Distance:    Right Eye Near:   Left Eye Near:    Bilateral Near:     Physical Exam Constitutional:      General: She is not in acute distress. HENT:     Head: Normocephalic and atraumatic.  Eyes:     General: No scleral icterus.    Pupils: Pupils are equal, round, and reactive to light.  Cardiovascular:     Rate and Rhythm: Normal rate.  Pulmonary:     Effort: Pulmonary effort is normal.  Musculoskeletal:     Left wrist: She exhibits tenderness. She exhibits normal range of motion, no bony tenderness, no swelling, no effusion, no crepitus and no deformity.       Arms:     Comments: No obvious deformity of back.  Skin:    Capillary Refill: Capillary refill takes less than 2 seconds.     Coloration: Skin is not jaundiced or pale.  Neurological:     General: No focal deficit present.     Mental Status: She is alert and oriented to person, place, and time.      UC Treatments / Results  Labs (all labs ordered are listed, but only abnormal results are displayed) Labs Reviewed - No  data to display  EKG   Radiology No results found.  Procedures Procedures (including critical care time)  Medications Ordered in UC Medications  ketorolac (TORADOL) 30 MG/ML injection 30 mg (30 mg Intramuscular Given 06/21/19 1247)    Initial Impression / Assessment and Plan / UC Course  I have reviewed the  triage vital signs and the nursing notes.  Pertinent labs & imaging results that were available during my care of the patient were reviewed by me and considered in my medical decision making (see chart for details).     H&P negative for significant trauma to the left wrist.  Patient does not have swelling, point tenderness, and does not feel she needs an x-ray at this time.  We will continue compression, will add rest, ice for additional relief.  Patient to continue follow-up with Ortho, and discuss possible pain management and/or PT referral with PCP.  Patient given Toradol injection in office which he tolerated well due to improvement of symptoms with this in the past.  Return precautions discussed, patient verbalized understanding and is agreeable to plan. Final Clinical Impressions(s) / UC Diagnoses   Final diagnoses:  Other chronic pain  Left wrist pain     Discharge Instructions     Recommend RICE: rest, ice, compression, elevation as needed for pain.    Heat therapy (hot compress, warm wash red, hot showers, etc.) can help relax muscles and soothe muscle aches. Cold therapy (ice packs) can be used to help swelling both after injury and after prolonged use of areas of chronic pain/aches.  For pain: recommend 350 mg-1000 mg of Tylenol (acetaminophen) and/or 200 mg - 800 mg of Advil (ibuprofen, Motrin) every 8 hours as needed.  May alternate between the two throughout the day as they are generally safe to take together.  DO NOT exceed more than 3000 mg of Tylenol or 3200 mg of ibuprofen in a 24 hour period as this could damage your stomach, kidneys, liver, or increase  your bleeding risk.     ED Prescriptions    None     PDMP not reviewed this encounter.   [place], [place], [place] 06/21/19 1739

## 2019-06-21 NOTE — Telephone Encounter (Signed)
1) Medication(s) Requested (by name): cyclobenzaprine (FLEXERIL) 10 MG tablet [[id number]]   2) Pharmacy of Choice:  [facility], [place] - [address]   Approved medications will be sent to pharmacy, we will reach out to you if there is an issue.  Requests made after 3pm may not be addressed until following business day!

## 2019-06-21 NOTE — ED Notes (Signed)
Patient able to ambulate independently  

## 2019-07-13 ENCOUNTER — Ambulatory Visit (INDEPENDENT_AMBULATORY_CARE_PROVIDER_SITE_OTHER): Payer: PRIVATE HEALTH INSURANCE | Admitting: Internal Medicine

## 2019-07-13 DIAGNOSIS — M545 Low back pain, unspecified: Secondary | ICD-10-CM

## 2019-07-13 DIAGNOSIS — G8929 Other chronic pain: Secondary | ICD-10-CM | POA: Diagnosis not present

## 2019-07-13 DIAGNOSIS — M797 Fibromyalgia: Secondary | ICD-10-CM | POA: Diagnosis not present

## 2019-07-13 NOTE — Progress Notes (Signed)
Patient states that she has seen PT previously & they aren't sure what to do about the falls.

## 2019-07-13 NOTE — Progress Notes (Signed)
Virtual Visit via Telephone Note Due to current restrictions/limitations of in-office visits due to the COVID-19 pandemic, this scheduled clinical appointment was converted to a telehealth visit  I connected with Shannon Spears on 07/13/19 at 11:10 AM EST by telephone and verified that I am speaking with the correct person using two identifiers. I am in my office.  The patient is at home.  Only the patient and myself participated in this encounter.  I discussed the limitations, risks, security and privacy concerns of performing an evaluation and management service by telephone and the availability of in person appointments. I also discussed with the patient that there may be a patient responsible charge related to this service. The patient expressed understanding and agreed to proceed.   History of Present Illness: Pt with hx of Bipolar ds, opioid use disorder, chronic LBP pain, fibromyalgia  Had a fall a few wks ago.  Reports several falls in the past yr "they have been freak accidents."  Slid on her puppy's plastic mat and fell backward.  Break fall with LT hand.  Injured LT wrist.  Seen in ER for same.  Told she had sprained the wrist.  Wrist is better but still has chronic pain in her lower back. X-rays 03/2019 revealed mild degenerative changes.  She tells me that she also has fibromyalgia (dx 20 yrs ago) that causes constant body pain. She is afraid of recurrent falls because she has 3 dogs that constantly get in her way.  She tells me 2 of her falls have been because of her 50 lb dog. Sometimes the dog jerks/pulls whenever he sees anybody.  -takes care of her daughter and her grandmother.  -does not take Tylenol because it does not help. -reports that she did pain management at [facility] in her 78s at [facility]. "Gabapentin makes me throw up."  Can not take antidepressants "because they make me depress.  Lyrica made me pass out."  Thinks she was tried with Cymbalta. Was seeing a neurologist at one point.   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  She also had trigger point injections.  Nothing worked.  -did P.T this past summer.  Outpatient Encounter Medications as of 07/13/2019  Medication Sig  . albuterol (PROVENTIL) (2.5 MG/3ML) 0.083% nebulizer solution Take 3 mLs (2.5 mg total) by nebulization every 6 (six) hours as needed for wheezing or shortness of breath.  Marland Kitchen albuterol (VENTOLIN HFA) 108 (90 Base) MCG/ACT inhaler Inhale 2 puffs into the lungs every 6 (six) hours as needed for wheezing or shortness of breath.  . cyclobenzaprine (FLEXERIL) 10 MG tablet Take 1 tablet (10 mg total) by mouth 3 (three) times daily as needed for muscle spasms.  . diazepam (VALIUM) 10 MG tablet Take 1 tablet (10 mg total) by mouth every 8 (eight) hours as needed for anxiety.  . famotidine (PEPCID AC MAXIMUM STRENGTH) 20 MG tablet Take 20 mg by mouth daily.  . Lactobacillus (PROBIOTIC ACIDOPHILUS PO) Take 1 capsule by mouth daily.  Marland Kitchen lamoTRIgine (LAMICTAL) 200 MG tablet Take 1 tablet (200 mg total) by mouth at bedtime.  . [DISCONTINUED] traMADol (ULTRAM) 50 MG tablet Take 1 tablet (50 mg total) by mouth every 12 (twelve) hours as needed.   No facility-administered encounter medications on file as of 07/13/2019.     Observations/Objective: No direct observation done as this was a telephone encounter  Assessment and  Plan: 1. Chronic low back pain, unspecified back pain laterality, unspecified whether sciatica present 2. Fibromyalgia -It seems as though patient has tried several different things including physical therapy, medication and pain management in the distant past.  I offered to refer her to a pain specialist since she has not seen one recently.  Patient declined.  Also discussed other measures that she can try including seeing a chiropractor.  Patient declined that also. -I recommend some  safety issues in terms of when she is walking her dog to be alert and aware of any approaching dog or person so that she is able to firm up her hold on the leash to prevent being jerked or pulled -Recommend heat therapy to the lower back.   Follow Up Instructions: PRN   I discussed the assessment and treatment plan with the patient. The patient was provided an opportunity to ask questions and all were answered. The patient agreed with the plan and demonstrated an understanding of the instructions.   The patient was advised to call back or seek an in-person evaluation if the symptoms worsen or if the condition fails to improve as anticipated.  I provided 31 minutes of non-face-to-face time during this encounter.   Jonah Blue, MD

## 2019-08-03 ENCOUNTER — Other Ambulatory Visit: Payer: PRIVATE HEALTH INSURANCE

## 2019-08-12 ENCOUNTER — Emergency Department (HOSPITAL_BASED_OUTPATIENT_CLINIC_OR_DEPARTMENT_OTHER)
Admission: EM | Admit: 2019-08-12 | Discharge: 2019-08-12 | Disposition: A | Discharge status: Home / Self Care | Payer: 59 | Attending: Emergency Medicine | Admitting: Emergency Medicine

## 2019-08-12 ENCOUNTER — Other Ambulatory Visit: Payer: Self-pay

## 2019-08-12 ENCOUNTER — Encounter (HOSPITAL_BASED_OUTPATIENT_CLINIC_OR_DEPARTMENT_OTHER): Payer: Self-pay | Admitting: Emergency Medicine

## 2019-08-12 DIAGNOSIS — F17210 Nicotine dependence, cigarettes, uncomplicated: Secondary | ICD-10-CM | POA: Diagnosis not present

## 2019-08-12 DIAGNOSIS — N189 Chronic kidney disease, unspecified: Secondary | ICD-10-CM | POA: Diagnosis not present

## 2019-08-12 DIAGNOSIS — Y9389 Activity, other specified: Secondary | ICD-10-CM | POA: Diagnosis not present

## 2019-08-12 DIAGNOSIS — Y9289 Other specified places as the place of occurrence of the external cause: Secondary | ICD-10-CM | POA: Diagnosis not present

## 2019-08-12 DIAGNOSIS — Z79899 Other long term (current) drug therapy: Secondary | ICD-10-CM | POA: Insufficient documentation

## 2019-08-12 DIAGNOSIS — W57XXXA Bitten or stung by nonvenomous insect and other nonvenomous arthropods, initial encounter: Secondary | ICD-10-CM | POA: Diagnosis not present

## 2019-08-12 DIAGNOSIS — S80861A Insect bite (nonvenomous), right lower leg, initial encounter: Secondary | ICD-10-CM | POA: Insufficient documentation

## 2019-08-12 DIAGNOSIS — Y999 Unspecified external cause status: Secondary | ICD-10-CM | POA: Diagnosis not present

## 2019-08-12 MED ORDER — CEFUROXIME AXETIL 500 MG PO TABS: 500.0000 mg | ORAL_TABLET | Freq: Two times a day (BID) | ORAL | 0 refills | Status: DC

## 2019-08-12 MED ORDER — ONDANSETRON 4 MG PO TBDP: 4.0000 mg | ORAL_TABLET | Freq: Three times a day (TID) | ORAL | 0 refills | Status: DC | PRN

## 2019-08-12 NOTE — ED Triage Notes (Signed)
Tick bite to R ankle Monday. States she has leg pain and nausea today.

## 2019-08-12 NOTE — ED Provider Notes (Signed)
[facility] EMERGENCY DEPARTMENT Provider Note   CSN: [id number] Arrival date & time: 08/12/19  1149     History Chief Complaint  Patient presents with  . Tick bite    Shannon Spears is a 39 y.o. female.  The history is provided by the patient. No language interpreter was used.      Shannon Spears is a 39 y.o. female who presents to the Emergency Department complaining of tick bite. On Monday patient noticed an embedded tick on her right distal leg. She removed the ticket that time. Over the last several days she has developed progressive nausea, malaise and pain in her leg. She denies any fevers, chest pain, shortness of breath, vomiting, diarrhea, leg swelling. She has a history of CKD, fibromyalgia. Symptoms are moderate, constant, worsening.  Past Medical History:  Diagnosis Date  . Anxiety   . Bipolar 1 disorder, manic, mild (HCC)   . CKD (chronic kidney disease)   . Depression   . Fibromyalgia   . GERD (gastroesophageal reflux disease)   . QIHKVQQV(956.3)     Patient Active Problem List   Diagnosis Date Noted  . Knee contusion 04/06/2019  . Bipolar I disorder, current or most recent episode depressed, in partial remission with mixed features (HCC) 06/16/2018  . Chronic post-traumatic stress disorder 06/16/2018  . 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 Opioid use disorder, mild, in sustained remission (HCC) 06/16/2018  . S/P cholecystectomy 02/10/2018  . S/P cervical spinal fusion 12/08/2017  . Protrusion of cervical intervertebral disc 12/08/2017  . HNP (herniated nucleus pulposus), cervical 10/02/2012    Class: Diagnosis of    Past Surgical History:  Procedure Laterality Date  . ANTERIOR CERVICAL DECOMP/DISCECTOMY FUSION N/A 10/02/2012   Procedure: C4-5, C5-6 anterior cervical discectomy and fusion, allograft plate;  Surgeon: Eldred Manges, MD;  Location: [facility] OR;  Service: Orthopedics;  Laterality: N/A;  . CYSTOGRAPHY    . LAPAROSCOPIC TOTAL HYSTERECTOMY      . OOPHORECTOMY    . TUBAL LIGATION       OB History   No obstetric history on file.     Family History  Problem Relation Age of Onset  . Bipolar disorder Mother   . Diabetes Mother   . Hypertension Mother   . Hypertension Father   . Colon cancer Paternal Grandmother     Social History   Tobacco Use  . Smoking status: Current Every Day Smoker    Packs/day: 0.50    Types: Cigarettes  . Smokeless tobacco: Never Used  Substance Use Topics  . Alcohol use: Yes    Comment: rarely  . Drug use: No    Home Medications Prior to Admission medications   Medication Sig Start Date End Date Taking? Authorizing Provider  albuterol (PROVENTIL) (2.5 MG/3ML) 0.083% nebulizer solution Take 3 mLs (2.5 mg total) by nebulization every 6 (six) hours as needed for wheezing or shortness of breath. 02/01/19   Bing Neighbors, FNP  albuterol (VENTOLIN HFA) 108 (90 Base) MCG/ACT inhaler Inhale 2 puffs into the lungs every 6 (six) hours as needed for wheezing or shortness of breath. 02/01/19   Bing Neighbors, FNP  cefUROXime (CEFTIN) 500 MG tablet Take 1 tablet (500 mg total) by mouth 2 (two) times daily with a meal. 08/12/19   Tilden Fossa, MD  cyclobenzaprine (FLEXERIL) 10 MG tablet Take 1 tablet (10 mg total) by mouth 3 (three) times daily as needed for muscle spasms. 06/21/19   Hoy Register, MD  diazepam (  VALIUM) 10 MG tablet Take 1 tablet (10 mg total) by mouth every 8 (eight) hours as needed for anxiety. 03/09/19   Delight Hoh, MD  famotidine (PEPCID AC MAXIMUM STRENGTH) 20 MG tablet Take 20 mg by mouth daily.    [provider]  Lactobacillus (PROBIOTIC ACIDOPHILUS PO) Take 1 capsule by mouth daily.    [provider]  lamoTRIgine (LAMICTAL) 200 MG tablet Take 1 tablet (200 mg total) by mouth at bedtime. 03/09/19   Delight Hoh, MD  ondansetron (ZOFRAN ODT) 4 MG disintegrating tablet Take 1 tablet (4 mg total) by mouth every 8 (eight) hours as needed for nausea  or vomiting. 08/12/19   Quintella Reichert, MD    Allergies    Butrans [buprenorphine], Adhesive [tape], Codeine, Gabapentin, Ibuprofen, Penicillins, Tetanus toxoids, and Doxycycline  Review of Systems   Review of Systems  All other systems reviewed and are negative.   Physical Exam Updated Vital Signs BP 117/71 (BP Location: Left Arm)   Pulse 82   Temp 99.1 F (37.3 C) (Oral)   Resp 18   Ht 5\' 1"  (1.549 m)   Wt 68 kg   LMP 10/02/2012   SpO2 100%   BMI 28.34 kg/m   Physical Exam Vitals and nursing note reviewed.  Constitutional:      Appearance: She is well-developed.  HENT:     Head: Normocephalic and atraumatic.  Cardiovascular:     Rate and Rhythm: Normal rate and regular rhythm.  Pulmonary:     Effort: Pulmonary effort is normal. No respiratory distress.  Abdominal:     Palpations: Abdomen is soft.     Tenderness: There is no abdominal tenderness. There is no guarding or rebound.  Musculoskeletal:        General: No swelling or tenderness.     Comments: Tiny punctate lesion to distal RLE with small surrounding erythematous ring, less than 1 cm in diameter.   Skin:    General: Skin is warm and dry.  Neurological:     Mental Status: She is alert and oriented to person, place, and time.  Psychiatric:        Behavior: Behavior normal.     ED Results / Procedures / Treatments   Labs (all labs ordered are listed, but only abnormal results are displayed) Labs Reviewed - No data to display  EKG None  Radiology No results found.  Procedures Procedures (including critical care time)  Medications Ordered in ED Medications - No data to display  ED Course  I have reviewed the triage vital signs and the nursing notes.  Pertinent labs & imaging results that were available during my care of the patient were reviewed by me and considered in my medical decision making (see chart for details).    MDM Rules/Calculators/A&P                     Patient here for  evaluation of leg pain, rash following tick bite on Monday. She is non-toxic appearing on evaluation with no evidence of cellulitis. She does have a tiny halo or ring surrounding that bite. There is no evidence of cellulitis or abscess. Presentation is not consistent with DVT. Patient has allergies to both penicillin as well as doxycycline. Patient is afebrile, presentation is not consistent with RMSF. Discussed with patient low prevalence of Lyme disease in the region. Also discussed that rash is not characteristic for EM, which is seen with Lyme disease. Patient with significant concerns despite  the discussion, states that multiple friends and family members near her home have been infected with Lyme disease. Will treat with cefuroxime for possible early lyme given her allergies. Discussed outpatient follow-up and return precautions. Offered patient COVID-19 testing in setting of her symptoms with nausea, body aches and malaise and patient declined.  Shannon Spears was evaluated in Emergency Department on 08/12/2019 for the symptoms described in the history of present illness. She was evaluated in the context of the global COVID-19 pandemic, which necessitated consideration that the patient might be at risk for infection with the SARS-CoV-2 virus that causes COVID-19. Institutional protocols and algorithms that pertain to the evaluation of patients at risk for COVID-19 are in a state of rapid change based on information released by regulatory bodies including the CDC and federal and state organizations. These policies and algorithms were followed during the patient's care in the ED.  Final Clinical Impression(s) / ED Diagnoses Final diagnoses:  Tick bite, initial encounter    Rx / DC Orders ED Discharge Orders         Ordered    cefUROXime (CEFTIN) 500 MG tablet  2 times daily with meals     08/12/19 1435    ondansetron (ZOFRAN ODT) 4 MG disintegrating tablet  Every 8 hours PRN     08/12/19 1435            Tilden Fossa, MD 08/12/19 1609

## 2019-09-06 ENCOUNTER — Telehealth: Payer: Self-pay | Admitting: Psychiatry

## 2019-09-06 DIAGNOSIS — F3175 Bipolar disorder, in partial remission, most recent episode depressed: Secondary | ICD-10-CM

## 2019-09-06 DIAGNOSIS — F4312 Post-traumatic stress disorder, chronic: Secondary | ICD-10-CM

## 2019-09-06 MED ORDER — DIAZEPAM 10 MG PO TABS: 10.0000 mg | ORAL_TABLET | Freq: Three times a day (TID) | ORAL | 0 refills | Status: DC | PRN

## 2019-09-06 NOTE — Telephone Encounter (Signed)
Last appointment 03/09/2019 included #90 and 5 refills of Valium 10 mg 3 times daily as needed with [place] registry documenting 4 of the 5 refills complete but now pharmacy may consider the eScription expired.  She has appointment 09/08/2018 but has 3 days without the Valium so that her eScription can be renewed as #90 with no refill for a 1 month supply of the Valium 10 mg 3 times daily as needed sent to [facility] for the interim ethically necessary no contraindication.

## 2019-09-06 NOTE — Telephone Encounter (Signed)
Patient called and said that she needs a refill on her valium to be sent to the [facility]. She has an appt 2/4. Her last refill expired

## 2019-09-09 ENCOUNTER — Ambulatory Visit (INDEPENDENT_AMBULATORY_CARE_PROVIDER_SITE_OTHER): Payer: 59 | Admitting: Psychiatry

## 2019-09-09 ENCOUNTER — Encounter: Payer: Self-pay | Admitting: Psychiatry

## 2019-09-09 ENCOUNTER — Other Ambulatory Visit: Payer: Self-pay

## 2019-09-09 VITALS — Ht 62.0 in | Wt 154.0 lb

## 2019-09-09 DIAGNOSIS — F451 Undifferentiated somatoform disorder: Secondary | ICD-10-CM

## 2019-09-09 DIAGNOSIS — F4312 Post-traumatic stress disorder, chronic: Secondary | ICD-10-CM | POA: Diagnosis not present

## 2019-09-09 DIAGNOSIS — F3175 Bipolar disorder, in partial remission, most recent episode depressed: Secondary | ICD-10-CM | POA: Diagnosis not present

## 2019-09-09 MED ORDER — DIAZEPAM 10 MG PO TABS: 10.0000 mg | ORAL_TABLET | Freq: Three times a day (TID) | ORAL | 4 refills | Status: DC | PRN

## 2019-09-09 MED ORDER — LAMOTRIGINE 200 MG PO TABS: 200.0000 mg | ORAL_TABLET | Freq: Every day | ORAL | 5 refills | Status: DC
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
## 2020-02-01 ENCOUNTER — Ambulatory Visit: Payer: PRIVATE HEALTH INSURANCE

## 2020-02-01 ENCOUNTER — Encounter: Payer: PRIVATE HEALTH INSURANCE | Admitting: Internal Medicine

## 2020-02-02 ENCOUNTER — Encounter: Payer: PRIVATE HEALTH INSURANCE | Admitting: Internal Medicine

## 2020-03-07 ENCOUNTER — Other Ambulatory Visit: Payer: Self-pay

## 2020-03-07 DIAGNOSIS — F3175 Bipolar disorder, in partial remission, most recent episode depressed: Secondary | ICD-10-CM

## 2020-03-07 DIAGNOSIS — F4312 Post-traumatic stress disorder, chronic: Secondary | ICD-10-CM

## 2020-03-07 MED ORDER — LAMOTRIGINE 200 MG PO TABS: 200.0000 mg | ORAL_TABLET | Freq: Every day | ORAL | 0 refills | Status: DC

## 2020-03-09 ENCOUNTER — Ambulatory Visit: Payer: 59 | Admitting: Psychiatry

## 2020-03-13 ENCOUNTER — Ambulatory Visit (INDEPENDENT_AMBULATORY_CARE_PROVIDER_SITE_OTHER): Payer: 59 | Admitting: Psychiatry

## 2020-03-13 ENCOUNTER — Other Ambulatory Visit: Payer: Self-pay

## 2020-03-13 VITALS — Ht 62.0 in | Wt 149.0 lb

## 2020-03-13 DIAGNOSIS — F451 Undifferentiated somatoform disorder: Secondary | ICD-10-CM

## 2020-03-13 DIAGNOSIS — F3175 Bipolar disorder, in partial remission, most recent episode depressed: Secondary | ICD-10-CM

## 2020-03-13 DIAGNOSIS — F4312 Post-traumatic stress disorder, chronic: Secondary | ICD-10-CM

## 2020-03-13 MED ORDER — HYDROXYZINE HCL 25 MG PO TABS: 25.0000 mg | ORAL_TABLET | Freq: Two times a day (BID) | ORAL | 2 refills | Status: DC | PRN

## 2020-03-13 MED ORDER — LAMOTRIGINE 200 MG PO TABS: 200.0000 mg | ORAL_TABLET | Freq: Every day | ORAL | 5 refills | Status: DC

## 2020-03-13 MED ORDER — DIAZEPAM 10 MG PO TABS: 10.0000 mg | ORAL_TABLET | Freq: Three times a day (TID) | ORAL | 5 refills | Status: DC | PRN
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
## 2020-03-16 ENCOUNTER — Encounter: Payer: Self-pay | Admitting: Psychiatry

## 2020-03-16 ENCOUNTER — Ambulatory Visit: Payer: 59 | Admitting: Psychiatry

## 2020-05-25 ENCOUNTER — Encounter: Payer: Self-pay | Admitting: Psychiatry

## 2020-09-06 ENCOUNTER — Other Ambulatory Visit: Payer: Self-pay | Admitting: Psychiatry

## 2020-09-06 DIAGNOSIS — F4312 Post-traumatic stress disorder, chronic: Secondary | ICD-10-CM

## 2020-09-06 DIAGNOSIS — F3175 Bipolar disorder, in partial remission, most recent episode depressed: Secondary | ICD-10-CM

## 2020-09-13 ENCOUNTER — Encounter: Payer: Self-pay | Admitting: Adult Health

## 2020-09-13 ENCOUNTER — Ambulatory Visit (INDEPENDENT_AMBULATORY_CARE_PROVIDER_SITE_OTHER): Payer: 59 | Admitting: Adult Health

## 2020-09-13 ENCOUNTER — Other Ambulatory Visit: Payer: Self-pay

## 2020-09-13 DIAGNOSIS — F3175 Bipolar disorder, in partial remission, most recent episode depressed: Secondary | ICD-10-CM

## 2020-09-13 DIAGNOSIS — F451 Undifferentiated somatoform disorder: Secondary | ICD-10-CM | POA: Diagnosis not present

## 2020-09-13 DIAGNOSIS — F411 Generalized anxiety disorder: Secondary | ICD-10-CM

## 2020-09-13 DIAGNOSIS — F4312 Post-traumatic stress disorder, chronic: Secondary | ICD-10-CM | POA: Diagnosis not present

## 2020-09-13 MED ORDER — LAMOTRIGINE 200 MG PO TABS: 200.0000 mg | ORAL_TABLET | Freq: Every day | ORAL | 5 refills | Status: DC

## 2020-09-13 MED ORDER — DIAZEPAM 10 MG PO TABS: 10.0000 mg | ORAL_TABLET | Freq: Three times a day (TID) | ORAL | 2 refills | Status: DC | PRN

## 2020-09-13 MED ORDER — HYDROXYZINE HCL 25 MG PO TABS: 25.0000 mg | ORAL_TABLET | Freq: Two times a day (BID) | ORAL | 5 refills | Status: DC | PRN

## 2020-09-13 NOTE — Progress Notes (Signed)
Derrick Orris [id number] 1981/03/27 39 y.o.  Subjective:   Patient ID:  Shannon Spears is a 40 y.o. (DOB Sep 19, 1980) female.  Chief Complaint: No chief complaint on file.   HPI Shannon Spears presents to the office today for follow-up of BPD-1, PTSD, anxiety, and somatic symptom disorder.  Describes mood today as "ok". Pleasant. Denies tearfulness. Mood symptoms - increased anxiety and irritability. Denies depression. Sting "things have been stressful". Recovering from 2 weeks of Covid, sinus infection, and other lingering side effects. Trying to get back on schedule with family responsibilities. Coordinating daughter's schedule. Helping care for grandmother - "I worry about her". Stable interest and motivation. Taking medications as prescribed.  Energy levels lower. Active, does not have a regular exercise routine.  Enjoys some usual interests and activities. Married. Lives with husband and daughter. Spending time with family. Appetite adequate - prednisone. Weight stable - trying to maintain - clothing fitttig the same.  Sleeps well most nights. Averages 6 to 8 hours. Napping during the day. Focus and concentration stable. Completing tasks. Managing aspects of household. 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  Denies AH or VH.  Previous medication trials: Unknown   GAD-7   Flowsheet Row Office Visit from 02/01/2019 in Primary Care at [facility]  Total GAD-7 Score 5    PHQ2-9   Flowsheet Row Office Visit from 02/01/2019 in [facility] Care at [facility] Total Score 0  PHQ-9 Total Score 5       Review of Systems:  Review of Systems  Musculoskeletal: Negative for gait problem.  Neurological: Negative for tremors.  Psychiatric/Behavioral:       Please refer to HPI    Medications: I have reviewed the patient's current medications.  Current Outpatient Medications  Medication Sig Dispense Refill  . albuterol (PROVENTIL) (2.5 MG/3ML) 0.083% nebulizer solution  Take 3 mLs (2.5 mg total) by nebulization every 6 (six) hours as needed for wheezing or shortness of breath. 150 mL 1  . albuterol (VENTOLIN HFA) 108 (90 Base) MCG/ACT inhaler Inhale 2 puffs into the lungs every 6 (six) hours as needed for wheezing or shortness of breath. 8 g 2  . cefUROXime (CEFTIN) 500 MG tablet Take 1 tablet (500 mg total) by mouth 2 (two) times daily with a meal. 40 tablet 0  . cyclobenzaprine (FLEXERIL) 10 MG tablet Take 1 tablet (10 mg total) by mouth 3 (three) times daily as needed for muscle spasms. 60 tablet 1  . diazepam (VALIUM) 10 MG tablet Take 1 tablet (10 mg total) by mouth 3 (three) times daily as needed for anxiety. 90 tablet 2  . famotidine (PEPCID AC MAXIMUM STRENGTH) 20 MG tablet Take 20 mg by mouth daily.    . hydrOXYzine (ATARAX/VISTARIL) 25 MG tablet Take 1 tablet (25 mg total) by mouth 2 (two) times daily as needed for anxiety or itching (Insomnia). 60 tablet 5  . Lactobacillus (PROBIOTIC ACIDOPHILUS PO) Take 1 capsule by mouth daily.    Marland Kitchen lamoTRIgine (LAMICTAL) 200 MG tablet Take 1 tablet (200 mg total) by mouth at bedtime. 30 tablet 5  . ondansetron (ZOFRAN ODT) 4 MG disintegrating tablet Take 1 tablet (4 mg total) by mouth every 8 (eight) hours as needed for nausea or vomiting. 8 tablet 0   No current facility-administered medications for this visit.    Medication Side Effects: None  Allergies:  Allergies  Allergen Reactions  . Butrans [Buprenorphine] Shortness Of Breath and Rash  . Adhesive [Tape]   .  Codeine     Mouth went numb  . Gabapentin Nausea And Vomiting  . Ibuprofen     Has kidney disease.  Marland Kitchen Penicillins Nausea And Vomiting and Swelling  . Tetanus Toxoids     Blister on legs  . Doxycycline Rash    Past Medical History:  Diagnosis Date  . Anxiety   . Bipolar 1 disorder, manic, mild (HCC)   . CKD (chronic kidney disease)   . Depression   . Fibromyalgia   . GERD (gastroesophageal reflux disease)   . Headache(784.0)      Family History  Problem Relation Age of Onset  . Bipolar disorder Mother   . Diabetes Mother   . Hypertension Mother   . Hypertension Father   . Colon cancer Paternal Grandmother     Social History   Socioeconomic History  . Marital status: Married    Spouse name: Not on file  . Number of children: Not on file  . Years of education: Not on file  . Highest education level: Not on file  Occupational History  . Not on file  Tobacco Use  . Smoking status: Current Every Day Smoker    Packs/day: 0.50    Types: Cigarettes  . Smokeless tobacco: Never Used  Substance and Sexual Activity  . Alcohol use: Yes    Comment: rarely  . Drug use: No  . Sexual activity: Not on file  Other Topics Concern  . Not on file  Social History Narrative  . Not on file   Social Determinants of Health   Financial Resource Strain: Not on file  Food Insecurity: Not on file  Transportation Needs: Not on file  Physical Activity: Not on file  Stress: Not on file  Social Connections: Not on file  Intimate Partner Violence: Not on file    Past Medical History, Surgical history, Social history, and Family history were reviewed and updated as appropriate.   Please see review of systems for further details on the patient's review from today.   Objective:   Physical Exam:  LMP 10/02/2012   Physical Exam Constitutional:      General: She is not in acute distress. Musculoskeletal:        General: No deformity.  Neurological:     Mental Status: She is alert and oriented to person, place, and time.     Coordination: Coordination normal.  Psychiatric:        Attention and Perception: Attention and perception normal. She does not perceive auditory or visual hallucinations.        Mood and Affect: Mood normal. Mood is not anxious or depressed. Affect is not labile, blunt, angry or inappropriate.        Speech: Speech normal.        Behavior: Behavior normal.        Thought Content: Thought  content normal. Thought content is not paranoid or delusional. Thought content does not include homicidal or suicidal ideation. Thought content does not include homicidal or suicidal plan.        Cognition and Memory: Cognition and memory normal.        Judgment: Judgment normal.     Comments: Insight intact     Lab Review:     Component Value Date/Time   NA 135 02/01/2019 1012   K 5.0 02/01/2019 1012   CL 98 02/01/2019 1012   CO2 25 02/01/2019 1012   GLUCOSE 87 02/01/2019 1012   GLUCOSE 104 (H) 11/14/2016 0502  BUN 8 02/01/2019 1012   CREATININE 0.70 02/01/2019 1012   CALCIUM 9.2 02/01/2019 1012   PROT 6.9 02/01/2019 1012   ALBUMIN 4.1 02/01/2019 1012   AST 22 02/01/2019 1012   ALT 22 02/01/2019 1012   ALKPHOS 180 (H) 02/01/2019 1012   BILITOT 0.3 02/01/2019 1012   GFRNONAA 110 02/01/2019 1012   GFRAA 127 02/01/2019 1012       Component Value Date/Time   WBC 13.0 (H) 02/01/2019 1012   WBC 10.2 02/26/2017 1115   RBC 4.36 02/01/2019 1012   RBC 4.43 02/26/2017 1115   HGB 14.2 02/01/2019 1012   HCT 41.8 02/01/2019 1012   PLT 518 (H) 02/01/2019 1012   MCV 96 02/01/2019 1012   MCH 32.6 02/01/2019 1012   MCH 32.3 02/26/2017 1115   MCHC 34.0 02/01/2019 1012   MCHC 33.5 02/26/2017 1115   RDW 13.6 02/01/2019 1012   LYMPHSABS 3.7 (H) 02/01/2019 1012   MONOABS 510 02/26/2017 1115   EOSABS 0.3 02/01/2019 1012   BASOSABS 0.1 02/01/2019 1012    No results found for: POCLITH, LITHIUM   No results found for: PHENYTOIN, PHENOBARB, VALPROATE, CBMZ   .res Assessment: Plan:    Plan:  PDMP reviewed  1. Lamictal 200mg  daily 2. Valium 10mg  TID 3. Atarax 25mg  BID  RTC 6 months  Patient advised to contact office with any questions, adverse effects, or acute worsening in signs and symptoms.  Discussed potential benefits, risk, and side effects of benzodiazepines to include potential risk of tolerance and dependence, as well as possible drowsiness.  Advised patient not to  drive if experiencing drowsiness and to take lowest possible effective dose to minimize risk of dependence and tolerance.  Counseled patient regarding potential benefits, risks, and side effects of Lamictal to include potential risk of Stevens-Johnson syndrome. Advised patient to stop taking Lamictal and contact office immediately if rash develops and to seek urgent medical attention if rash is severe and/or spreading quickly.Diagnoses and all orders for this visit:  Bipolar I disorder, current or most recent episode depressed, in partial remission with mixed features (HCC) -     diazepam (VALIUM) 10 MG tablet; Take 1 tablet (10 mg total) by mouth 3 (three) times daily as needed for anxiety. -     lamoTRIgine (LAMICTAL) 200 MG tablet; Take 1 tablet (200 mg total) by mouth at bedtime.  Chronic post-traumatic stress disorder -     diazepam (VALIUM) 10 MG tablet; Take 1 tablet (10 mg total) by mouth 3 (three) times daily as needed for anxiety. -     hydrOXYzine (ATARAX/VISTARIL) 25 MG tablet; Take 1 tablet (25 mg total) by mouth 2 (two) times daily as needed for anxiety or itching (Insomnia). -     lamoTRIgine (LAMICTAL) 200 MG tablet; Take 1 tablet (200 mg total) by mouth at bedtime.  Somatic symptom disorder -     hydrOXYzine (ATARAX/VISTARIL) 25 MG tablet; Take 1 tablet (25 mg total) by mouth 2 (two) times daily as needed for anxiety or itching (Insomnia).  Generalized anxiety disorder     Please see After Visit Summary for patient specific instructions.  No future appointments.  No orders of the defined types were placed in this encounter.   -------------------------------

## 2020-10-23 ENCOUNTER — Telehealth: Payer: Self-pay | Admitting: Adult Health

## 2020-10-23 NOTE — Telephone Encounter (Signed)
Pt was in the office today with her daughter. She is not sleeping more than a couple of hours each night. She had covid in December and ever since then she has a constant headache . She has an appointment with a neurologist in April. Her ears hurt as well. She doesn't know if the lack of sleep is from the constant headaches. However she was wondering if you could prescribe something to help her sleep. 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 Her pharmacy is [facility]. She needs to be notified if a script has been sent to [facility] because her pharmacy will not contact her

## 2020-10-23 NOTE — Telephone Encounter (Signed)
Please review

## 2020-10-23 NOTE — Telephone Encounter (Signed)
Is the patient Rease or her daughter?

## 2020-10-23 NOTE — Telephone Encounter (Signed)
Maralyn Sago were you referring to Kyannah or her daughter?

## 2020-10-24 NOTE — Telephone Encounter (Signed)
She is taking Valium and Hydroxyzine - could increase Hydroxyzine dose.

## 2020-10-24 NOTE — Telephone Encounter (Signed)
Shannon Spears is the patient

## 2020-10-25 NOTE — Telephone Encounter (Signed)
We could offer Trazadone 50mg  or low dose Seroquel 50mg .

## 2020-10-25 NOTE — Telephone Encounter (Signed)
Left msg to call back and discuss.

## 2020-10-25 NOTE — Telephone Encounter (Signed)
Rtc to pt and she reports receiving a call from Neurology after we hung up this morning. They had a cancellation so she went in, neurologist prescribed Keppra 250 mg bid. She is going to start tonight and the pharmacist said it could make her drowsy. She is hoping to sleep with it as well. She has tried trazodone in the past. Advised her to hold off until she see's how Keppra does, she agreed. She also reports Dr. Marlyne Beards told her not to take Seroquel but she couldn't remember why. She is scheduled to get an MRI as well.   Advised her to call back with an update.

## 2020-10-25 NOTE — Telephone Encounter (Signed)
Rtc to pt to clarify symptoms, she reports she is not sleeping much, maybe 2-3 hours a night. She is having ear pain with headaches and it's keeping her up. She does feel like she is manic but because lack of sleep. Reports Almira Coaster knows her situation but she takes care of her grandmother who just fell, having stressors with husband and daughter as well.   Main issue is sleep. Not sure the increase in hydroxyzine will be effective for her sleep.   Informed her I would follow up with a recommendation

## 2020-10-26 NOTE — Telephone Encounter (Signed)
Noted  

## 2020-11-08 DIAGNOSIS — M8580 Other specified disorders of bone density and structure, unspecified site: Secondary | ICD-10-CM | POA: Insufficient documentation

## 2020-11-28 DIAGNOSIS — E559 Vitamin D deficiency, unspecified: Secondary | ICD-10-CM | POA: Insufficient documentation

## 2020-12-12 ENCOUNTER — Other Ambulatory Visit: Payer: Self-pay | Admitting: Adult Health

## 2020-12-12 DIAGNOSIS — F3175 Bipolar disorder, in partial remission, most recent episode depressed: Secondary | ICD-10-CM

## 2020-12-12 DIAGNOSIS — F4312 Post-traumatic stress disorder, chronic: Secondary | ICD-10-CM

## 2020-12-13 NOTE — Telephone Encounter (Signed)
Next apt 8/09

## 2021-03-13 ENCOUNTER — Ambulatory Visit: Payer: 59 | Admitting: Adult Health

## 2021-03-19 ENCOUNTER — Other Ambulatory Visit: Payer: Self-pay

## 2021-03-19 ENCOUNTER — Telehealth: Payer: Self-pay | Admitting: Adult Health

## 2021-03-19 DIAGNOSIS — F3175 Bipolar disorder, in partial remission, most recent episode depressed: Secondary | ICD-10-CM

## 2021-03-19 DIAGNOSIS — F4312 Post-traumatic stress disorder, chronic: Secondary | ICD-10-CM

## 2021-03-19 MED ORDER — DIAZEPAM 10 MG PO TABS: 10.0000 mg | ORAL_TABLET | Freq: Three times a day (TID) | ORAL | 1 refills | Status: DC | PRN

## 2021-03-19 NOTE — Telephone Encounter (Signed)
Called pt to RS 8/16 appt and she stated that she is a few days over for her prescription refill. Says she is good on other medications and just needs refill for Diazepam 10mg . Appt 8/24. Pharmacy [facility] [address] [place], [place]

## 2021-03-19 NOTE — Telephone Encounter (Signed)
Pended.

## 2021-03-20 ENCOUNTER — Ambulatory Visit: Payer: 59 | Admitting: Adult Health

## 2021-03-28 ENCOUNTER — Other Ambulatory Visit: Payer: Self-pay

## 2021-03-28 ENCOUNTER — Ambulatory Visit (INDEPENDENT_AMBULATORY_CARE_PROVIDER_SITE_OTHER): Payer: 59 | Admitting: Adult Health

## 2021-03-28 ENCOUNTER — Encounter: Payer: Self-pay | Admitting: Adult Health

## 2021-03-28 DIAGNOSIS — F4312 Post-traumatic stress disorder, chronic: Secondary | ICD-10-CM

## 2021-03-28 DIAGNOSIS — F451 Undifferentiated somatoform disorder: Secondary | ICD-10-CM | POA: Diagnosis not present

## 2021-03-28 DIAGNOSIS — F411 Generalized anxiety disorder: Secondary | ICD-10-CM | POA: Diagnosis not present

## 2021-03-28 DIAGNOSIS — F3175 Bipolar disorder, in partial remission, most recent episode depressed: Secondary | ICD-10-CM | POA: Diagnosis not present

## 2021-03-28 MED ORDER — LAMOTRIGINE 200 MG PO TABS: 200.0000 mg | ORAL_TABLET | Freq: Every day | ORAL | 5 refills | Status: DC

## 2021-03-28 MED ORDER — HYDROXYZINE HCL 25 MG PO TABS: 25.0000 mg | ORAL_TABLET | Freq: Two times a day (BID) | ORAL | 5 refills | Status: DC | PRN

## 2021-03-28 MED ORDER — DIAZEPAM 10 MG PO TABS: 10.0000 mg | ORAL_TABLET | Freq: Three times a day (TID) | ORAL | 1 refills | Status: DC | PRN

## 2021-03-28 NOTE — Progress Notes (Signed)
Shannon Spears [id number] 08-18-1980 40 y.o.  Subjective:   Patient ID:  Shannon Spears is a 40 y.o. (DOB 1981-06-12) female.  Chief Complaint: No chief complaint on file.   HPI Shannon Spears presents to the office today for follow-up of BPD-1, PTSD, anxiety, and somatic symptom disorder.  Describes mood today as "ok". Pleasant. Tearful throughout interview. Mood symptoms - increased anxiety and irritability. Stating "my anxiety is through the roof". Denies depression. Increased stressors with medical and personal issues. Stating "I'm in a lot of pain". Concerned about 23 y/o daughter - increase behavioral issues. Helping care for grandmother. Feels overwhelmed. Stable interest and motivation. Taking medications as prescribed.  Energy levels lower. Active, does not have a regular exercise routine.  Enjoys some usual interests and activities. Married. Lives with husband and 57 year old daughter. Spending time with family. Appetite adequate. Weight stable. Sleeps better some nights than others. Typically averages 6 to 8 hours - "not latey".  Focus and concentration stable. Completing tasks. Managing aspects of household. 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  Denies AH or VH.  Previous medication trials: Unknown   GAD-7    Flowsheet Row Office Visit from 02/01/2019 in Primary Care at [facility]  Total GAD-7 Score 5      PHQ2-9    Flowsheet Row Office Visit from 02/01/2019 in Primary Care at [facility] Total Score 0  PHQ-9 Total Score 5        Review of Systems:  Review of Systems  Musculoskeletal:  Negative for gait problem.  Neurological:  Negative for tremors.  Psychiatric/Behavioral:         Please refer to HPI   Medications: I have reviewed the patient's current medications.  Current Outpatient Medications  Medication Sig Dispense Refill   albuterol (PROVENTIL) (2.5 MG/3ML) 0.083% nebulizer solution Take 3 mLs (2.5 mg total) by nebulization  every 6 (six) hours as needed for wheezing or shortness of breath. 150 mL 1   albuterol (VENTOLIN HFA) 108 (90 Base) MCG/ACT inhaler Inhale 2 puffs into the lungs every 6 (six) hours as needed for wheezing or shortness of breath. 8 g 2   cefUROXime (CEFTIN) 500 MG tablet Take 1 tablet (500 mg total) by mouth 2 (two) times daily with a meal. 40 tablet 0   cyclobenzaprine (FLEXERIL) 10 MG tablet Take 1 tablet (10 mg total) by mouth 3 (three) times daily as needed for muscle spasms. 60 tablet 1   diazepam (VALIUM) 10 MG tablet Take 1 tablet (10 mg total) by mouth 3 (three) times daily as needed for anxiety. 90 tablet 1   famotidine (PEPCID AC MAXIMUM STRENGTH) 20 MG tablet Take 20 mg by mouth daily.     hydrOXYzine (ATARAX/VISTARIL) 25 MG tablet Take 1 tablet (25 mg total) by mouth 2 (two) times daily as needed for anxiety or itching (Insomnia). 60 tablet 5   Lactobacillus (PROBIOTIC ACIDOPHILUS PO) Take 1 capsule by mouth daily.     lamoTRIgine (LAMICTAL) 200 MG tablet Take 1 tablet (200 mg total) by mouth at bedtime. 30 tablet 5   ondansetron (ZOFRAN ODT) 4 MG disintegrating tablet Take 1 tablet (4 mg total) by mouth every 8 (eight) hours as needed for nausea or vomiting. 8 tablet 0   No current facility-administered medications for this visit.    Medication Side Effects: None  Allergies:  Allergies  Allergen Reactions   Butrans [Buprenorphine] Shortness Of Breath and Rash   Adhesive [Tape]  Codeine     Mouth went numb   Gabapentin Nausea And Vomiting   Ibuprofen     Has kidney disease.   Penicillins Nausea And Vomiting and Swelling   Tetanus Toxoids     Blister on legs   Doxycycline Rash    Past Medical History:  Diagnosis Date   Anxiety    Bipolar 1 disorder, manic, mild (HCC)    CKD (chronic kidney disease)    Depression    Fibromyalgia    GERD (gastroesophageal reflux disease)    Headache(784.0)     Past Medical History, Surgical history, Social history, and Family  history were reviewed and updated as appropriate.   Please see review of systems for further details on the patient's review from today.   Objective:   Physical Exam:  LMP 10/02/2012   Physical Exam Constitutional:      General: She is not in acute distress. Musculoskeletal:        General: No deformity.  Neurological:     Mental Status: She is alert and oriented to person, place, and time.     Coordination: Coordination normal.  Psychiatric:        Attention and Perception: Attention and perception normal. She does not perceive auditory or visual hallucinations.        Mood and Affect: Mood normal. Mood is not anxious or depressed. Affect is not labile, blunt, angry or inappropriate.        Speech: Speech normal.        Behavior: Behavior normal.        Thought Content: Thought content normal. Thought content is not paranoid or delusional. Thought content does not include homicidal or suicidal ideation. Thought content does not include homicidal or suicidal plan.        Cognition and Memory: Cognition and memory normal.        Judgment: Judgment normal.     Comments: Insight intact    Lab Review:     Component Value Date/Time   NA 135 02/01/2019 1012   K 5.0 02/01/2019 1012   CL 98 02/01/2019 1012   CO2 25 02/01/2019 1012   GLUCOSE 87 02/01/2019 1012   GLUCOSE 104 (H) 11/14/2016 0502   BUN 8 02/01/2019 1012   CREATININE 0.70 02/01/2019 1012   CALCIUM 9.2 02/01/2019 1012   PROT 6.9 02/01/2019 1012   ALBUMIN 4.1 02/01/2019 1012   AST 22 02/01/2019 1012   ALT 22 02/01/2019 1012   ALKPHOS 180 (H) 02/01/2019 1012   BILITOT 0.3 02/01/2019 1012   GFRNONAA 110 02/01/2019 1012   GFRAA 127 02/01/2019 1012       Component Value Date/Time   WBC 13.0 (H) 02/01/2019 1012   WBC 10.2 02/26/2017 1115   RBC 4.36 02/01/2019 1012   RBC 4.43 02/26/2017 1115   HGB 14.2 02/01/2019 1012   HCT 41.8 02/01/2019 1012   PLT 518 (H) 02/01/2019 1012   MCV 96 02/01/2019 1012   MCH 32.6  02/01/2019 1012   MCH 32.3 02/26/2017 1115   MCHC 34.0 02/01/2019 1012   MCHC 33.5 02/26/2017 1115   RDW 13.6 02/01/2019 1012   LYMPHSABS 3.7 (H) 02/01/2019 1012   MONOABS 510 02/26/2017 1115   EOSABS 0.3 02/01/2019 1012   BASOSABS 0.1 02/01/2019 1012    No results found for: POCLITH, LITHIUM   No results found for: PHENYTOIN, PHENOBARB, VALPROATE, CBMZ   .res Assessment: Plan:    Plan:  PDMP reviewed  1. Lamictal 200mg  daily 2.  Valium 10mg  TID 3. Atarax 25mg  BID  RTC 4 weeks  Patient advised to contact office with any questions, adverse effects, or acute worsening in signs and symptoms.  Discussed potential benefits, risk, and side effects of benzodiazepines to include potential risk of tolerance and dependence, as well as possible drowsiness.  Advised patient not to drive if experiencing drowsiness and to take lowest possible effective dose to minimize risk of dependence and tolerance.  Counseled patient regarding potential benefits, risks, and side effects of Lamictal to include potential risk of Stevens-Johnson syndrome. Advised patient to stop taking Lamictal and contact office immediately if rash develops and to seek urgent medical attention if rash is severe and/or spreading quickly.Diagnoses and all orders for this visit:   Diagnoses and all orders for this visit:  Generalized anxiety disorder  Chronic post-traumatic stress disorder -     hydrOXYzine (ATARAX/VISTARIL) 25 MG tablet; Take 1 tablet (25 mg total) by mouth 2 (two) times daily as needed for anxiety or itching (Insomnia). -     lamoTRIgine (LAMICTAL) 200 MG tablet; Take 1 tablet (200 mg total) by mouth at bedtime. -     diazepam (VALIUM) 10 MG tablet; Take 1 tablet (10 mg total) by mouth 3 (three) times daily as needed for anxiety.  Somatic symptom disorder -     hydrOXYzine (ATARAX/VISTARIL) 25 MG tablet; Take 1 tablet (25 mg total) by mouth 2 (two) times daily as needed for anxiety or itching  (Insomnia).  Bipolar I disorder, current or most recent episode depressed, in partial remission with mixed features (HCC) -     lamoTRIgine (LAMICTAL) 200 MG tablet; Take 1 tablet (200 mg total) by mouth at bedtime. -     diazepam (VALIUM) 10 MG tablet; Take 1 tablet (10 mg total) by mouth 3 (three) times daily as needed for anxiety.    Please see After Visit Summary for patient specific instructions.  Future Appointments  Date Time Provider Department Center  07/02/2021 11:20 AM Birdie Fetty, , NP [facility] None    No orders of the defined types were placed in this encounter.   -------------------------------

## 2021-04-25 IMAGING — DX LUMBAR SPINE - 2-3 VIEW
3 series · 3 of 3 positions shown · non-contrast
Comparison: 12/29/2018

CLINICAL DATA: Several falls recently with low back pain, initial
encounter

EXAM:
LUMBAR SPINE - 3 VIEW

[lumbar spine ap]
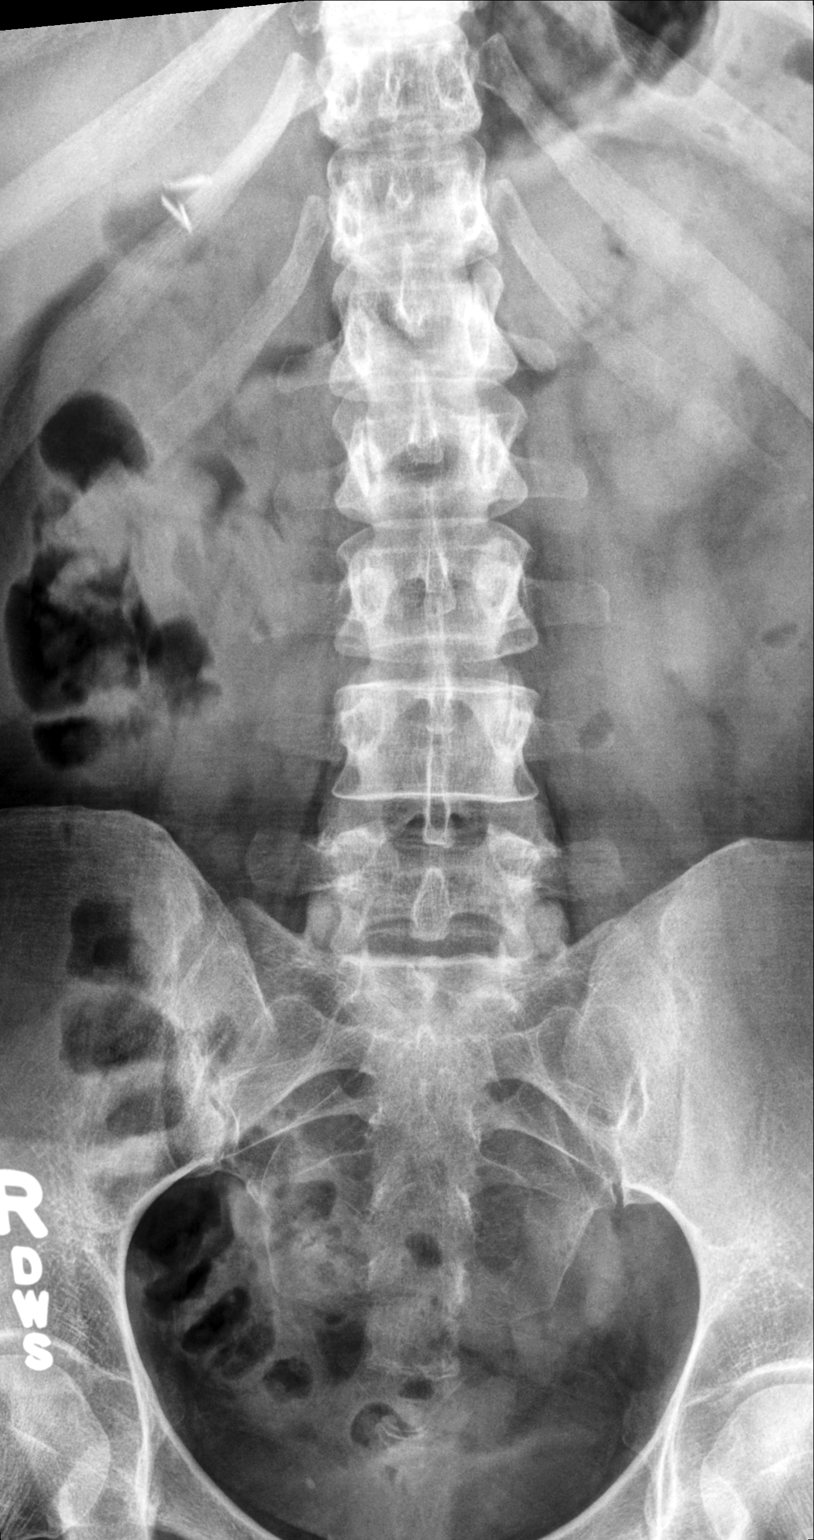

[lumbar spine lat (1 of 2)]
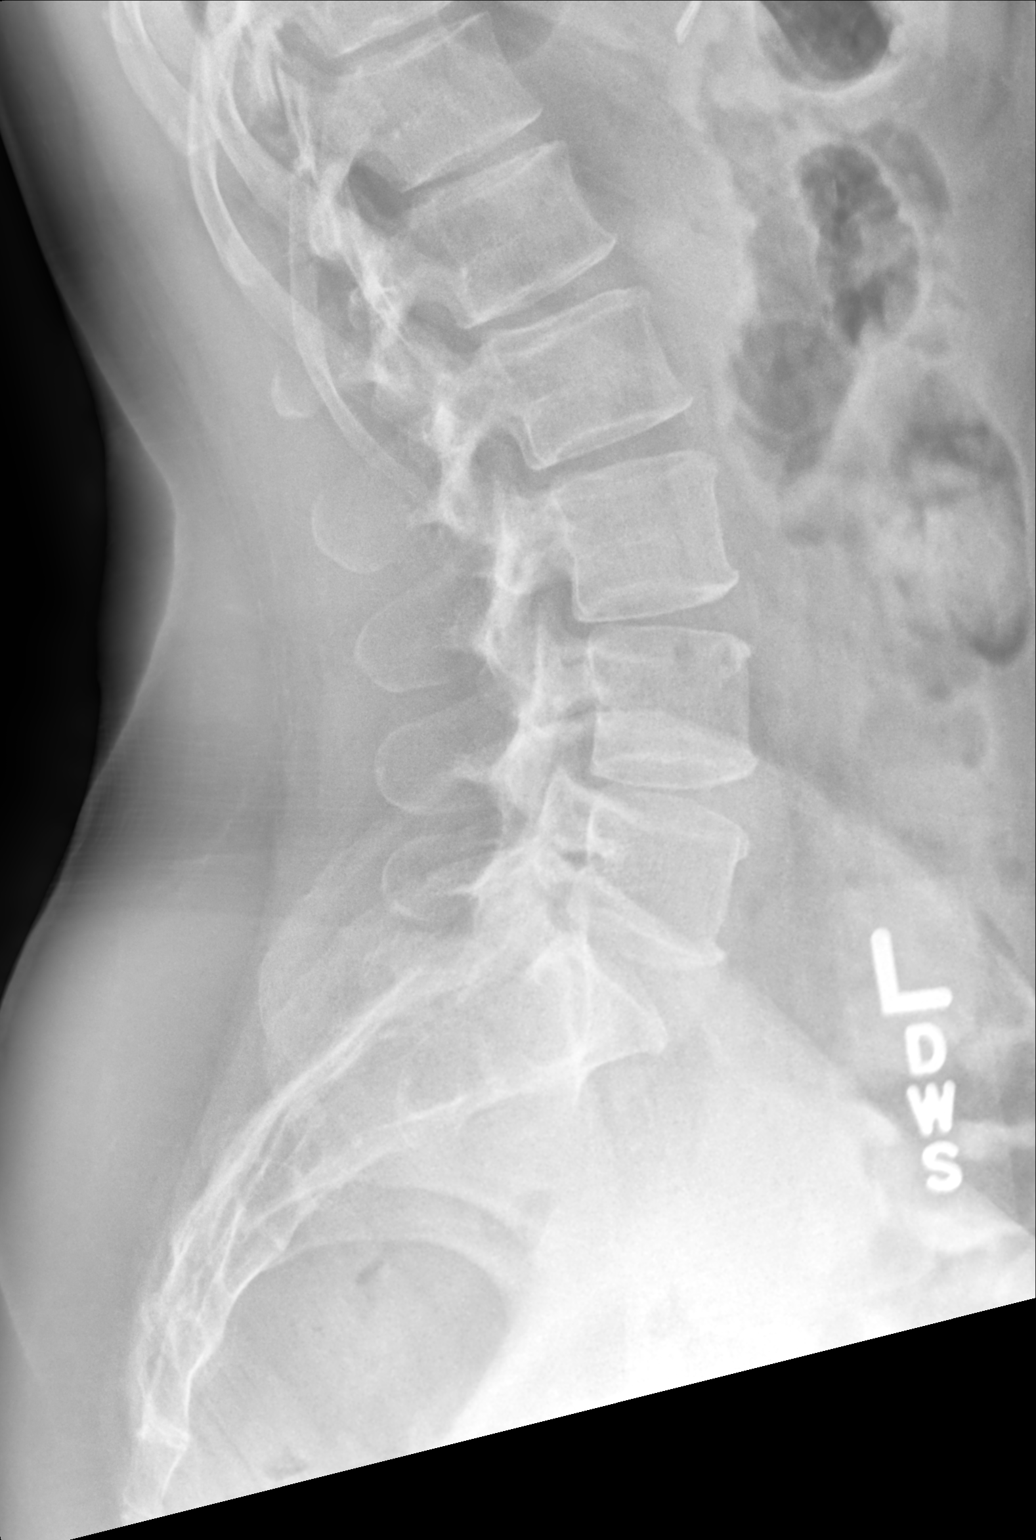

[lumbar spine lat (2 of 2)]
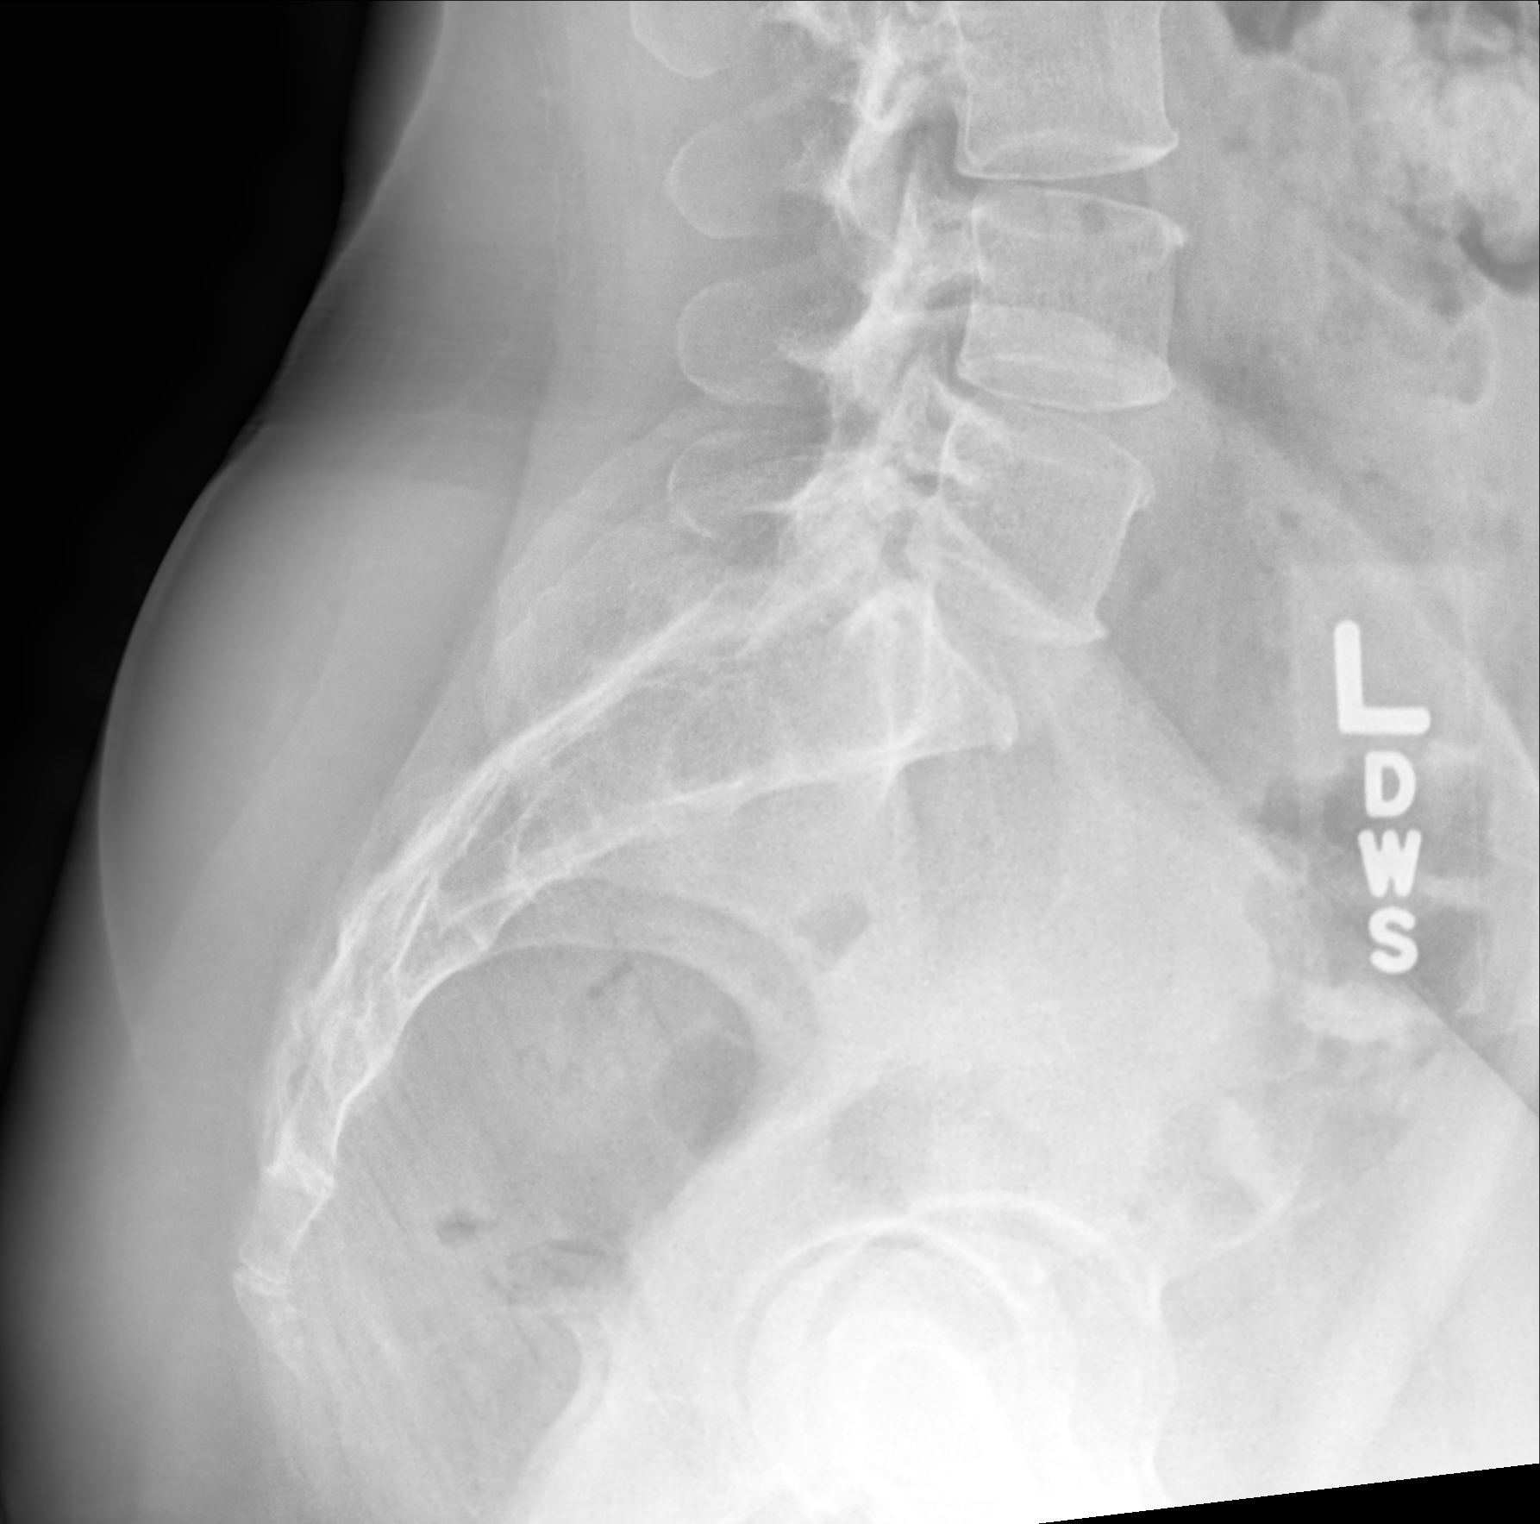

[3 of 3 positions shown; findings below may reference images not displayed]

FINDINGS: Five lumbar vertebra are well visualized. Vertebral body height is
well maintained. Mild osteophytic changes are seen. No acute
fracture is noted. No soft tissue abnormality is seen.
IMPRESSION: Mild degenerative change without acute abnormality.

## 2021-07-02 ENCOUNTER — Ambulatory Visit (INDEPENDENT_AMBULATORY_CARE_PROVIDER_SITE_OTHER): Payer: 59 | Admitting: Adult Health

## 2021-07-02 ENCOUNTER — Other Ambulatory Visit: Payer: Self-pay

## 2021-07-02 ENCOUNTER — Encounter: Payer: Self-pay | Admitting: Adult Health

## 2021-07-02 DIAGNOSIS — F451 Undifferentiated somatoform disorder: Secondary | ICD-10-CM

## 2021-07-02 DIAGNOSIS — F4312 Post-traumatic stress disorder, chronic: Secondary | ICD-10-CM

## 2021-07-02 DIAGNOSIS — F411 Generalized anxiety disorder: Secondary | ICD-10-CM | POA: Diagnosis not present

## 2021-07-02 DIAGNOSIS — F3175 Bipolar disorder, in partial remission, most recent episode depressed: Secondary | ICD-10-CM | POA: Diagnosis not present

## 2021-07-02 MED ORDER — DIAZEPAM 10 MG PO TABS: 10.0000 mg | ORAL_TABLET | Freq: Three times a day (TID) | ORAL | 2 refills | Status: DC | PRN

## 2021-07-02 NOTE — Progress Notes (Signed)
Shannon Spears [id number] 03/25/1981 40 y.o.  Subjective:   Patient ID:  Shannon Spears is a 40 y.o. (DOB 09/03/1980) female.  Chief Complaint: No chief complaint on file.   HPI Shannon Spears presents to the office today for follow-up of BPD-1, PTSD, anxiety, and somatic symptom disorder.  Describes mood today as "ok". Pleasant. Tearful at times. Mood symptoms - reports anxiety and irritability. Denies depression. Feels overwhelmed at times. Increased stressors. Having issues with 81 year old daughter - irrational thoughts and fears. Caring for grandmother. Working with a cardiologist for [facility]. Recovering from 2nd case of Covid this year - gastroenteritis complication. Also following up with other medical concerns. Stable interest and motivation. Taking medications as prescribed.  Energy levels lower. Active, does not have a regular exercise routine.  Enjoys some usual interests and activities. Married. Lives with husband and 16 year old daughter. Spending time with family. Appetite adequate. Weight stable. Sleeps better some nights than others. Typically averages 6 to 8 hours. Focus and concentration stable. Completing tasks. Managing aspects of household. 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  Denies AH or VH.  Previous medication trials: Unknown   GAD-7    Flowsheet Row Office Visit from 02/01/2019 in Primary Care at [facility]  Total GAD-7 Score 5      PHQ2-9    Flowsheet Row Office Visit from 02/01/2019 in Primary Care at [facility] Total Score 0  PHQ-9 Total Score 5        Review of Systems:  Review of Systems  Musculoskeletal:  Negative for gait problem.  Neurological:  Negative for tremors.  Psychiatric/Behavioral:         Please refer to HPI   Medications: I have reviewed the patient's current medications.  Current Outpatient Medications  Medication Sig Dispense Refill   albuterol (PROVENTIL) (2.5 MG/3ML) 0.083% nebulizer solution Take  3 mLs (2.5 mg total) by nebulization every 6 (six) hours as needed for wheezing or shortness of breath. 150 mL 1   albuterol (VENTOLIN HFA) 108 (90 Base) MCG/ACT inhaler Inhale 2 puffs into the lungs every 6 (six) hours as needed for wheezing or shortness of breath. 8 g 2   cefUROXime (CEFTIN) 500 MG tablet Take 1 tablet (500 mg total) by mouth 2 (two) times daily with a meal. 40 tablet 0   cyclobenzaprine (FLEXERIL) 10 MG tablet Take 1 tablet (10 mg total) by mouth 3 (three) times daily as needed for muscle spasms. 60 tablet 1   diazepam (VALIUM) 10 MG tablet Take 1 tablet (10 mg total) by mouth 3 (three) times daily as needed for anxiety. 90 tablet 1   famotidine (PEPCID AC MAXIMUM STRENGTH) 20 MG tablet Take 20 mg by mouth daily.     hydrOXYzine (ATARAX/VISTARIL) 25 MG tablet Take 1 tablet (25 mg total) by mouth 2 (two) times daily as needed for anxiety or itching (Insomnia). 60 tablet 5   Lactobacillus (PROBIOTIC ACIDOPHILUS PO) Take 1 capsule by mouth daily.     lamoTRIgine (LAMICTAL) 200 MG tablet Take 1 tablet (200 mg total) by mouth at bedtime. 30 tablet 5   ondansetron (ZOFRAN ODT) 4 MG disintegrating tablet Take 1 tablet (4 mg total) by mouth every 8 (eight) hours as needed for nausea or vomiting. 8 tablet 0   No current facility-administered medications for this visit.    Medication Side Effects: None  Allergies:  Allergies  Allergen Reactions   Butrans [Buprenorphine] Shortness Of Breath and Rash  Adhesive [Tape]    Codeine     Mouth went numb   Gabapentin Nausea And Vomiting   Ibuprofen     Has kidney disease.   Penicillins Nausea And Vomiting and Swelling   Tetanus Toxoids     Blister on legs   Doxycycline Rash    Past Medical History:  Diagnosis Date   Anxiety    Bipolar 1 disorder, manic, mild (HCC)    CKD (chronic kidney disease)    Depression    Fibromyalgia    GERD (gastroesophageal reflux disease)    Headache(784.0)     Past Medical History, Surgical  history, Social history, and Family history were reviewed and updated as appropriate.   Please see review of systems for further details on the patient's review from today.   Objective:   Physical Exam:  LMP 10/02/2012   Physical Exam Constitutional:      General: She is not in acute distress. Musculoskeletal:        General: No deformity.  Neurological:     Mental Status: She is alert and oriented to person, place, and time.     Coordination: Coordination normal.  Psychiatric:        Attention and Perception: Attention and perception normal. She does not perceive auditory or visual hallucinations.        Mood and Affect: Mood normal. Mood is not anxious or depressed. Affect is not labile, blunt, angry or inappropriate.        Speech: Speech normal.        Behavior: Behavior normal.        Thought Content: Thought content normal. Thought content is not paranoid or delusional. Thought content does not include homicidal or suicidal ideation. Thought content does not include homicidal or suicidal plan.        Cognition and Memory: Cognition and memory normal.        Judgment: Judgment normal.     Comments: Insight intact    Lab Review:     Component Value Date/Time   NA 135 02/01/2019 1012   K 5.0 02/01/2019 1012   CL 98 02/01/2019 1012   CO2 25 02/01/2019 1012   GLUCOSE 87 02/01/2019 1012   GLUCOSE 104 (H) 11/14/2016 0502   BUN 8 02/01/2019 1012   CREATININE 0.70 02/01/2019 1012   CALCIUM 9.2 02/01/2019 1012   PROT 6.9 02/01/2019 1012   ALBUMIN 4.1 02/01/2019 1012   AST 22 02/01/2019 1012   ALT 22 02/01/2019 1012   ALKPHOS 180 (H) 02/01/2019 1012   BILITOT 0.3 02/01/2019 1012   GFRNONAA 110 02/01/2019 1012   GFRAA 127 02/01/2019 1012       Component Value Date/Time   WBC 13.0 (H) 02/01/2019 1012   WBC 10.2 02/26/2017 1115   RBC 4.36 02/01/2019 1012   RBC 4.43 02/26/2017 1115   HGB 14.2 02/01/2019 1012   HCT 41.8 02/01/2019 1012   PLT 518 (H) 02/01/2019 1012    MCV 96 02/01/2019 1012   MCH 32.6 02/01/2019 1012   MCH 32.3 02/26/2017 1115   MCHC 34.0 02/01/2019 1012   MCHC 33.5 02/26/2017 1115   RDW 13.6 02/01/2019 1012   LYMPHSABS 3.7 (H) 02/01/2019 1012   MONOABS 510 02/26/2017 1115   EOSABS 0.3 02/01/2019 1012   BASOSABS 0.1 02/01/2019 1012    No results found for: POCLITH, LITHIUM   No results found for: PHENYTOIN, PHENOBARB, VALPROATE, CBMZ   .res Assessment: Plan:    Plan:  PDMP reviewed  1. Lamictal 200mg  daily 2. Valium 10mg  TID 3. Atarax 25mg  BID  RTC 3 months  Patient advised to contact office with any questions, adverse effects, or acute worsening in signs and symptoms.  Discussed potential benefits, risk, and side effects of benzodiazepines to include potential risk of tolerance and dependence, as well as possible drowsiness.  Advised patient not to drive if experiencing drowsiness and to take lowest possible effective dose to minimize risk of dependence and tolerance.  Counseled patient regarding potential benefits, risks, and side effects of Lamictal to include potential risk of Stevens-Johnson syndrome. Advised patient to stop taking Lamictal and contact office immediately if rash develops and to seek urgent medical attention if rash is severe and/or spreading quickly.Diagnoses and all orders for this visit:   Diagnoses and all orders for this visit:  Somatic symptom disorder  Chronic post-traumatic stress disorder  Bipolar I disorder, current or most recent episode depressed, in partial remission with mixed features (HCC)  Generalized anxiety disorder    Please see After Visit Summary for patient specific instructions.  No future appointments.  No orders of the defined types were placed in this encounter.   -------------------------------

## 2021-09-22 ENCOUNTER — Other Ambulatory Visit: Payer: Self-pay | Admitting: Adult Health

## 2021-09-22 DIAGNOSIS — F4312 Post-traumatic stress disorder, chronic: Secondary | ICD-10-CM

## 2021-09-22 DIAGNOSIS — F451 Undifferentiated somatoform disorder: Secondary | ICD-10-CM

## 2021-10-02 ENCOUNTER — Other Ambulatory Visit: Payer: Self-pay

## 2021-10-02 ENCOUNTER — Ambulatory Visit (INDEPENDENT_AMBULATORY_CARE_PROVIDER_SITE_OTHER): Payer: 59 | Admitting: Adult Health

## 2021-10-02 ENCOUNTER — Encounter: Payer: Self-pay | Admitting: Adult Health

## 2021-10-02 DIAGNOSIS — F451 Undifferentiated somatoform disorder: Secondary | ICD-10-CM

## 2021-10-02 DIAGNOSIS — F3175 Bipolar disorder, in partial remission, most recent episode depressed: Secondary | ICD-10-CM

## 2021-10-02 DIAGNOSIS — F4312 Post-traumatic stress disorder, chronic: Secondary | ICD-10-CM | POA: Diagnosis not present

## 2021-10-02 DIAGNOSIS — F411 Generalized anxiety disorder: Secondary | ICD-10-CM | POA: Diagnosis not present

## 2021-10-02 MED ORDER — LAMOTRIGINE 200 MG PO TABS: 200.0000 mg | ORAL_TABLET | Freq: Every day | ORAL | 5 refills | Status: DC

## 2021-10-02 MED ORDER — HYDROXYZINE HCL 25 MG PO TABS: ORAL_TABLET | ORAL | 5 refills | Status: DC

## 2021-10-02 MED ORDER — DIAZEPAM 10 MG PO TABS: 10.0000 mg | ORAL_TABLET | Freq: Three times a day (TID) | ORAL | 2 refills | Status: DC | PRN
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
## 2021-12-20 ENCOUNTER — Telehealth: Payer: Self-pay | Admitting: Adult Health

## 2021-12-20 DIAGNOSIS — F4312 Post-traumatic stress disorder, chronic: Secondary | ICD-10-CM

## 2021-12-20 DIAGNOSIS — F451 Undifferentiated somatoform disorder: Secondary | ICD-10-CM

## 2021-12-20 MED ORDER — HYDROXYZINE HCL 25 MG PO TABS: ORAL_TABLET | ORAL | 1 refills | Status: DC

## 2021-12-20 NOTE — Telephone Encounter (Signed)
Patient states that she has been taking Hydroxyzine 25mg  and has had a second allergic reaction this month from a different medication. Her doctor advised her to take an extra Hydroxyzine along with Predinson to help alleviate the pain.Due to taking the extra pill she is about to run and would like a refill to be sent to [facility] [address] [place] Ph: [phone].

## 2021-12-20 NOTE — Telephone Encounter (Signed)
New Rx sent and patient notified.

## 2021-12-20 NOTE — Telephone Encounter (Signed)
How many is she taking a day? We can send in a script for 25mg  - 4 times daily.

## 2021-12-25 ENCOUNTER — Ambulatory Visit (INDEPENDENT_AMBULATORY_CARE_PROVIDER_SITE_OTHER): Payer: Commercial Managed Care - HMO | Admitting: Adult Health

## 2021-12-25 ENCOUNTER — Encounter: Payer: Self-pay | Admitting: Adult Health

## 2021-12-25 DIAGNOSIS — F411 Generalized anxiety disorder: Secondary | ICD-10-CM

## 2021-12-25 DIAGNOSIS — F451 Undifferentiated somatoform disorder: Secondary | ICD-10-CM | POA: Diagnosis not present

## 2021-12-25 DIAGNOSIS — F4312 Post-traumatic stress disorder, chronic: Secondary | ICD-10-CM | POA: Diagnosis not present

## 2021-12-25 DIAGNOSIS — F3175 Bipolar disorder, in partial remission, most recent episode depressed: Secondary | ICD-10-CM | POA: Diagnosis not present

## 2021-12-25 MED ORDER — DIAZEPAM 10 MG PO TABS: 10.0000 mg | ORAL_TABLET | Freq: Three times a day (TID) | ORAL | 2 refills | Status: DC | PRN
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
## 2022-02-22 ENCOUNTER — Other Ambulatory Visit: Payer: Self-pay | Admitting: Adult Health

## 2022-02-22 DIAGNOSIS — F4312 Post-traumatic stress disorder, chronic: Secondary | ICD-10-CM

## 2022-02-22 DIAGNOSIS — F451 Undifferentiated somatoform disorder: Secondary | ICD-10-CM

## 2022-03-27 ENCOUNTER — Ambulatory Visit (INDEPENDENT_AMBULATORY_CARE_PROVIDER_SITE_OTHER): Payer: Commercial Managed Care - HMO | Admitting: Adult Health

## 2022-03-27 ENCOUNTER — Encounter: Payer: Self-pay | Admitting: Adult Health

## 2022-03-27 DIAGNOSIS — F411 Generalized anxiety disorder: Secondary | ICD-10-CM | POA: Diagnosis not present

## 2022-03-27 DIAGNOSIS — F3175 Bipolar disorder, in partial remission, most recent episode depressed: Secondary | ICD-10-CM

## 2022-03-27 DIAGNOSIS — F4312 Post-traumatic stress disorder, chronic: Secondary | ICD-10-CM | POA: Diagnosis not present

## 2022-03-27 DIAGNOSIS — F451 Undifferentiated somatoform disorder: Secondary | ICD-10-CM | POA: Diagnosis not present

## 2022-03-27 MED ORDER — LAMOTRIGINE 200 MG PO TABS: 200.0000 mg | ORAL_TABLET | Freq: Every day | ORAL | 5 refills | Status: DC

## 2022-03-27 MED ORDER — DIAZEPAM 10 MG PO TABS: 10.0000 mg | ORAL_TABLET | Freq: Three times a day (TID) | ORAL | 2 refills | Status: DC | PRN

## 2022-03-27 MED ORDER — HYDROXYZINE HCL 25 MG PO TABS: ORAL_TABLET | ORAL | 2 refills | Status: DC

## 2022-03-27 NOTE — Progress Notes (Signed)
[name] [id number] Nov 21, 1980 41 y.o.  Subjective:   Patient ID:  Shannon Spears is a 41 y.o. (DOB [birth year]) female.  Chief Complaint: No chief complaint on file.   HPI Shannon Spears presents to the office today for follow-up of BPD-1, PTSD, anxiety, and somatic symptom disorder.  Describes mood today as "not the best". Pleasant. Tearful throughout interview. Mood symptoms - denies depression. Stating "I'm going through a funk". Reports anxiety and irritability. Reports worry and rumination. Reports mood fluctuations. Concerned about her physical health. Worried about her husband. Reports ongoing issues with daughter. Grieving the loss of her friend and her 2 sons. Working with various providers for health issues. Stable interest and motivation. Taking medications as prescribed.  Energy levels lower. Active, does not have a regular exercise routine.  Enjoys some usual interests and activities. Married. Lives with husband and 23 year old daughter. Spending time with family. Appetite adequate. Weight gain with prednisone - 185 pounds.  Sleeps better some nights than others. Averages 6 hours at night and naps during the day - pain issues. Focus and concentration stable. Completing tasks. Managing aspects of household. Stay at home mom. Denies SI or HI.  Denies AH or VH. Denies self harm.  Denies substance use. Followed by pain management.   Previous medication trials: Unknown    GAD-7    Flowsheet Row Office Visit from 02/01/2019 in Primary Care at [facility]  Total GAD-7 Score 5      PHQ2-9    Flowsheet Row Office Visit from 02/01/2019 in Primary Care at [facility] Total Score 0  PHQ-9 Total Score 5        Review of Systems:  Review of Systems  Musculoskeletal:  Negative for gait problem.  Neurological:  Negative for tremors.  Psychiatric/Behavioral:         Please refer to HPI    Medications: I have reviewed the patient's current  medications.  Current Outpatient Medications  Medication Sig Dispense Refill   albuterol (PROVENTIL) (2.5 MG/3ML) 0.083% nebulizer solution Take 3 mLs (2.5 mg total) by nebulization every 6 (six) hours as needed for wheezing or shortness of breath. 150 mL 1   albuterol (VENTOLIN HFA) 108 (90 Base) MCG/ACT inhaler Inhale 2 puffs into the lungs every 6 (six) hours as needed for wheezing or shortness of breath. 8 g 2   cefUROXime (CEFTIN) 500 MG tablet Take 1 tablet (500 mg total) by mouth 2 (two) times daily with a meal. 40 tablet 0   cyclobenzaprine (FLEXERIL) 10 MG tablet Take 1 tablet (10 mg total) by mouth 3 (three) times daily as needed for muscle spasms. 60 tablet 1   diazepam (VALIUM) 10 MG tablet Take 1 tablet (10 mg total) by mouth 3 (three) times daily as needed for anxiety. 90 tablet 2   famotidine (PEPCID AC MAXIMUM STRENGTH) 20 MG tablet Take 20 mg by mouth daily.     hydrOXYzine (ATARAX) 25 MG tablet TAKE 1 TABLET BY MOUTH UP TO 4 TIMES DAILY AS NEEDED FOR ANXIETY OR ITCHING (INSOMNIA) 120 tablet 2   Lactobacillus (PROBIOTIC ACIDOPHILUS PO) Take 1 capsule by mouth daily.     lamoTRIgine (LAMICTAL) 200 MG tablet Take 1 tablet (200 mg total) by mouth at bedtime. 30 tablet 5   ondansetron (ZOFRAN ODT) 4 MG disintegrating tablet Take 1 tablet (4 mg total) by mouth every 8 (eight) hours as needed for nausea or vomiting. 8 tablet 0   No current facility-administered medications for this visit.  Medication Side Effects: None  Allergies:  Allergies  Allergen Reactions   Buprenorphine Shortness Of Breath and Rash    Breathing problems   Penicillins Nausea And Vomiting, Swelling and Anaphylaxis   Gabapentin Nausea And Vomiting   Hydrocodone-Acetaminophen     Other reaction(s): Other Other Reaction: Other reaction   Nsaids     Other reaction(s): Other Renal insufficiency   Other Dermatitis    Tape   Propoxyphene Nausea Only    darvocet   Tetanus Toxoids     Blister on  legs Other reaction(s): Other Blister on legs   Adhesive [Tape]    Codeine     Mouth went numb   Ibuprofen     Has kidney disease.   Doxycycline Rash    Past Medical History:  Diagnosis Date   Anxiety    Bipolar 1 disorder, manic, mild (HCC)    CKD (chronic kidney disease)    Depression    Fibromyalgia    GERD (gastroesophageal reflux disease)    Headache(784.0)     Past Medical History, Surgical history, Social history, and Family history were reviewed and updated as appropriate.   Please see review of systems for further details on the patient's review from today.   Objective:   Physical Exam:  LMP 10/02/2012   Physical Exam Constitutional:      General: She is not in acute distress. Musculoskeletal:        General: No deformity.  Neurological:     Mental Status: She is alert and oriented to person, place, and time.     Coordination: Coordination normal.  Psychiatric:        Attention and Perception: Attention and perception normal. She does not perceive auditory or visual hallucinations.        Mood and Affect: Mood normal. Mood is not anxious or depressed. Affect is not labile, blunt, angry or inappropriate.        Speech: Speech normal.        Behavior: Behavior normal.        Thought Content: Thought content normal. Thought content is not paranoid or delusional. Thought content does not include homicidal or suicidal ideation. Thought content does not include homicidal or suicidal plan.        Cognition and Memory: Cognition and memory normal.        Judgment: Judgment normal.     Comments: Insight intact     Lab Review:     Component Value Date/Time   NA 135 02/01/2019 1012   K 5.0 02/01/2019 1012   CL 98 02/01/2019 1012   CO2 25 02/01/2019 1012   GLUCOSE 87 02/01/2019 1012   GLUCOSE 104 (H) 11/14/2016 0502   BUN 8 02/01/2019 1012   CREATININE 0.70 02/01/2019 1012   CALCIUM 9.2 02/01/2019 1012   PROT 6.9 02/01/2019 1012   ALBUMIN 4.1 02/01/2019  1012   AST 22 02/01/2019 1012   ALT 22 02/01/2019 1012   ALKPHOS 180 (H) 02/01/2019 1012   BILITOT 0.3 02/01/2019 1012   GFRNONAA 110 02/01/2019 1012   GFRAA 127 02/01/2019 1012       Component Value Date/Time   WBC 13.0 (H) 02/01/2019 1012   WBC 10.2 02/26/2017 1115   RBC 4.36 02/01/2019 1012   RBC 4.43 02/26/2017 1115   HGB 14.2 02/01/2019 1012   HCT 41.8 02/01/2019 1012   PLT 518 (H) 02/01/2019 1012   MCV 96 02/01/2019 1012   MCH 32.6 02/01/2019 1012   MCH 32.3  02/26/2017 1115   MCHC 34.0 02/01/2019 1012   MCHC 33.5 02/26/2017 1115   RDW 13.6 02/01/2019 1012   LYMPHSABS 3.7 (H) 02/01/2019 1012   MONOABS 510 02/26/2017 1115   EOSABS 0.3 02/01/2019 1012   BASOSABS 0.1 02/01/2019 1012    No results found for: "POCLITH", "LITHIUM"   No results found for: "PHENYTOIN", "PHENOBARB", "VALPROATE", "CBMZ"   .res Assessment: Plan:    Plan:  PDMP reviewed  1. Lamictal 200mg  daily 2. Valium 10mg  TID 3. Atarax 25mg  BID  RTC 3 months  Patient advised to contact office with any questions, adverse effects, or acute worsening in signs and symptoms.  Discussed potential benefits, risk, and side effects of benzodiazepines to include potential risk of tolerance and dependence, as well as possible drowsiness.  Advised patient not to drive if experiencing drowsiness and to take lowest possible effective dose to minimize risk of dependence and tolerance.  Counseled patient regarding potential benefits, risks, and side effects of Lamictal to include potential risk of Stevens-Johnson syndrome. Advised patient to stop taking Lamictal and contact office immediately if rash develops and to seek urgent medical attention if rash is severe and/or spreading quickly. Diagnoses and all orders for this visit:  Generalized anxiety disorder  Chronic post-traumatic stress disorder -     lamoTRIgine (LAMICTAL) 200 MG tablet; Take 1 tablet (200 mg total) by mouth at bedtime. -     diazepam  (VALIUM) 10 MG tablet; Take 1 tablet (10 mg total) by mouth 3 (three) times daily as needed for anxiety. -     hydrOXYzine (ATARAX) 25 MG tablet; TAKE 1 TABLET BY MOUTH UP TO 4 TIMES DAILY AS NEEDED FOR ANXIETY OR ITCHING (INSOMNIA)  Bipolar I disorder, current or most recent episode depressed, in partial remission with mixed features (HCC) -     lamoTRIgine (LAMICTAL) 200 MG tablet; Take 1 tablet (200 mg total) by mouth at bedtime. -     diazepam (VALIUM) 10 MG tablet; Take 1 tablet (10 mg total) by mouth 3 (three) times daily as needed for anxiety.  Somatic symptom disorder -     hydrOXYzine (ATARAX) 25 MG tablet; TAKE 1 TABLET BY MOUTH UP TO 4 TIMES DAILY AS NEEDED FOR ANXIETY OR ITCHING (INSOMNIA)     Please see After Visit Summary for patient specific instructions.  No future appointments.  No orders of the defined types were placed in this encounter.   -------------------------------

## 2022-06-19 ENCOUNTER — Ambulatory Visit (INDEPENDENT_AMBULATORY_CARE_PROVIDER_SITE_OTHER): Payer: Commercial Managed Care - HMO | Admitting: Adult Health

## 2022-06-19 ENCOUNTER — Encounter: Payer: Self-pay | Admitting: Adult Health

## 2022-06-19 DIAGNOSIS — F3175 Bipolar disorder, in partial remission, most recent episode depressed: Secondary | ICD-10-CM

## 2022-06-19 DIAGNOSIS — F451 Undifferentiated somatoform disorder: Secondary | ICD-10-CM | POA: Diagnosis not present

## 2022-06-19 DIAGNOSIS — F4312 Post-traumatic stress disorder, chronic: Secondary | ICD-10-CM

## 2022-06-19 MED ORDER — HYDROXYZINE HCL 25 MG PO TABS: ORAL_TABLET | ORAL | 2 refills | Status: DC

## 2022-06-19 MED ORDER — DIAZEPAM 10 MG PO TABS
10.0000 mg | ORAL_TABLET | Freq: Three times a day (TID) | ORAL | 2 refills | Status: DC | PRN
Start: 2022-06-19 — End: 2022-09-19

## 2022-06-19 NOTE — Progress Notes (Signed)
Shannon Spears [id number] 04-Aug-1981 41 y.o.  Subjective:   Patient ID:  Shannon Spears is a 41 y.o. (DOB 05-Jun-1981) female.  Chief Complaint: No chief complaint on file.   HPI Shannon Spears presents to the office today for follow-up of BPD-1, PTSD, anxiety, and somatic symptom disorder.  Describes mood today as "not good". Pleasant. Tearful throughout interview. Mood symptoms - denies depression. Reports anxiety and irritability. Reports increased worry and rumination. Reports mood is consistent with medications. Reports increased situational stressors - family. Reports ongoing pain issues - right foot. Worried about her husband. Daughter with mental health issues. Working with various providers for health issues. Stable interest and motivation. Taking medications as prescribed.  Energy levels variable. Active, does not have a regular exercise routine.  Enjoys some usual interests and activities. Married. Lives with husband and 37 year old daughter. Spending time with family. Appetite adequate. Weight gain. Sleeps better some nights than others. Averages 6 hours at night and naps some during the day. Focus and concentration stable. Completing tasks. Managing aspects of household. Stay at home mom. Denies SI or HI.  Denies AH or VH. Denies self harm.  Denies substance use.  Followed by pain management.   Previous medication trials: Unknown   GAD-7    Flowsheet Row Office Visit from 02/01/2019 in Primary Care at [facility]  Total GAD-7 Score 5      PHQ2-9    Flowsheet Row Office Visit from 02/01/2019 in Primary Care at [facility] Total Score 0  PHQ-9 Total Score 5        Review of Systems:  Review of Systems  Musculoskeletal:  Negative for gait problem.  Neurological:  Negative for tremors.  Psychiatric/Behavioral:         Please refer to HPI    Medications: I have reviewed the patient's current medications.  Current Outpatient Medications  Medication Sig  Dispense Refill   albuterol (PROVENTIL) (2.5 MG/3ML) 0.083% nebulizer solution Take 3 mLs (2.5 mg total) by nebulization every 6 (six) hours as needed for wheezing or shortness of breath. 150 mL 1   albuterol (VENTOLIN HFA) 108 (90 Base) MCG/ACT inhaler Inhale 2 puffs into the lungs every 6 (six) hours as needed for wheezing or shortness of breath. 8 g 2   cefUROXime (CEFTIN) 500 MG tablet Take 1 tablet (500 mg total) by mouth 2 (two) times daily with a meal. 40 tablet 0   cyclobenzaprine (FLEXERIL) 10 MG tablet Take 1 tablet (10 mg total) by mouth 3 (three) times daily as needed for muscle spasms. 60 tablet 1   diazepam (VALIUM) 10 MG tablet Take 1 tablet (10 mg total) by mouth 3 (three) times daily as needed for anxiety. 90 tablet 2   famotidine (PEPCID AC MAXIMUM STRENGTH) 20 MG tablet Take 20 mg by mouth daily.     hydrOXYzine (ATARAX) 25 MG tablet TAKE 1 TABLET BY MOUTH UP TO 6 TIMES DAILY AS NEEDED FOR ANXIETY, ITCHING, AND INSOMNIA. 180 tablet 2   Lactobacillus (PROBIOTIC ACIDOPHILUS PO) Take 1 capsule by mouth daily.     lamoTRIgine (LAMICTAL) 200 MG tablet Take 1 tablet (200 mg total) by mouth at bedtime. 30 tablet 5   ondansetron (ZOFRAN ODT) 4 MG disintegrating tablet Take 1 tablet (4 mg total) by mouth every 8 (eight) hours as needed for nausea or vomiting. 8 tablet 0   No current facility-administered medications for this visit.    Medication Side Effects: None  Allergies:  Allergies  Allergen  Reactions   Buprenorphine Shortness Of Breath and Rash    Breathing problems   Penicillins Nausea And Vomiting, Swelling and Anaphylaxis   Gabapentin Nausea And Vomiting   Hydrocodone-Acetaminophen     Other reaction(s): Other Other Reaction: Other reaction   Nsaids     Other reaction(s): Other Renal insufficiency   Other Dermatitis    Tape   Propoxyphene Nausea Only    darvocet   Tetanus Toxoids     Blister on legs Other reaction(s): Other Blister on legs   Adhesive [Tape]     Codeine     Mouth went numb   Ibuprofen     Has kidney disease.   Doxycycline Rash    Past Medical History:  Diagnosis Date   Anxiety    Bipolar 1 disorder, manic, mild (HCC)    CKD (chronic kidney disease)    Depression    Fibromyalgia    GERD (gastroesophageal reflux disease)    Headache(784.0)     Past Medical History, Surgical history, Social history, and Family history were reviewed and updated as appropriate.   Please see review of systems for further details on the patient's review from today.   Objective:   Physical Exam:  LMP 10/02/2012   Physical Exam Constitutional:      General: She is not in acute distress. Musculoskeletal:        General: No deformity.  Neurological:     Mental Status: She is alert and oriented to person, place, and time.     Coordination: Coordination normal.  Psychiatric:        Attention and Perception: Attention and perception normal. She does not perceive auditory or visual hallucinations.        Mood and Affect: Mood normal. Mood is not anxious or depressed. Affect is not labile, blunt, angry or inappropriate.        Speech: Speech normal.        Behavior: Behavior normal.        Thought Content: Thought content normal. Thought content is not paranoid or delusional. Thought content does not include homicidal or suicidal ideation. Thought content does not include homicidal or suicidal plan.        Cognition and Memory: Cognition and memory normal.        Judgment: Judgment normal.     Comments: Insight intact     Lab Review:     Component Value Date/Time   NA 135 02/01/2019 1012   K 5.0 02/01/2019 1012   CL 98 02/01/2019 1012   CO2 25 02/01/2019 1012   GLUCOSE 87 02/01/2019 1012   GLUCOSE 104 (H) 11/14/2016 0502   BUN 8 02/01/2019 1012   CREATININE 0.70 02/01/2019 1012   CALCIUM 9.2 02/01/2019 1012   PROT 6.9 02/01/2019 1012   ALBUMIN 4.1 02/01/2019 1012   AST 22 02/01/2019 1012   ALT 22 02/01/2019 1012   ALKPHOS  180 (H) 02/01/2019 1012   BILITOT 0.3 02/01/2019 1012   GFRNONAA 110 02/01/2019 1012   GFRAA 127 02/01/2019 1012       Component Value Date/Time   WBC 13.0 (H) 02/01/2019 1012   WBC 10.2 02/26/2017 1115   RBC 4.36 02/01/2019 1012   RBC 4.43 02/26/2017 1115   HGB 14.2 02/01/2019 1012   HCT 41.8 02/01/2019 1012   PLT 518 (H) 02/01/2019 1012   MCV 96 02/01/2019 1012   MCH 32.6 02/01/2019 1012   MCH 32.3 02/26/2017 1115   MCHC 34.0 02/01/2019 1012  MCHC 33.5 02/26/2017 1115   RDW 13.6 02/01/2019 1012   LYMPHSABS 3.7 (H) 02/01/2019 1012   MONOABS 510 02/26/2017 1115   EOSABS 0.3 02/01/2019 1012   BASOSABS 0.1 02/01/2019 1012    No results found for: "POCLITH", "LITHIUM"   No results found for: "PHENYTOIN", "PHENOBARB", "VALPROATE", "CBMZ"   .res Assessment: Plan:    Plan:  PDMP reviewed  1. Lamictal 200mg  daily 2. Valium 10mg  TID 3. Atarax 25mg  up to 6 times daily  RTC 3 months  Patient advised to contact office with any questions, adverse effects, or acute worsening in signs and symptoms.  Discussed potential benefits, risk, and side effects of benzodiazepines to include potential risk of tolerance and dependence, as well as possible drowsiness.  Advised patient not to drive if experiencing drowsiness and to take lowest possible effective dose to minimize risk of dependence and tolerance.  Counseled patient regarding potential benefits, risks, and side effects of Lamictal to include potential risk of Stevens-Johnson syndrome. Advised patient to stop taking Lamictal and contact office immediately if rash develops and to seek urgent medical attention if rash is severe and/or spreading quickly.  Diagnoses and all orders for this visit:  Chronic post-traumatic stress disorder -     diazepam (VALIUM) 10 MG tablet; Take 1 tablet (10 mg total) by mouth 3 (three) times daily as needed for anxiety. -     hydrOXYzine (ATARAX) 25 MG tablet; TAKE 1 TABLET BY MOUTH UP TO 6 TIMES  DAILY AS NEEDED FOR ANXIETY, ITCHING, AND INSOMNIA.  Bipolar I disorder, current or most recent episode depressed, in partial remission with mixed features (HCC) -     diazepam (VALIUM) 10 MG tablet; Take 1 tablet (10 mg total) by mouth 3 (three) times daily as needed for anxiety.  Somatic symptom disorder -     hydrOXYzine (ATARAX) 25 MG tablet; TAKE 1 TABLET BY MOUTH UP TO 6 TIMES DAILY AS NEEDED FOR ANXIETY, ITCHING, AND INSOMNIA.     Please see After Visit Summary for patient specific instructions.  No future appointments.  No orders of the defined types were placed in this encounter.   -------------------------------

## 2022-07-09 ENCOUNTER — Other Ambulatory Visit: Payer: Self-pay | Admitting: Orthopedic Surgery

## 2022-07-09 DIAGNOSIS — M5416 Radiculopathy, lumbar region: Secondary | ICD-10-CM

## 2022-07-19 ENCOUNTER — Other Ambulatory Visit: Payer: Self-pay | Admitting: Orthopaedic Surgery

## 2022-07-19 DIAGNOSIS — M79604 Pain in right leg: Secondary | ICD-10-CM

## 2022-07-21 ENCOUNTER — Other Ambulatory Visit: Payer: Commercial Managed Care - HMO

## 2022-07-27 ENCOUNTER — Ambulatory Visit (INDEPENDENT_AMBULATORY_CARE_PROVIDER_SITE_OTHER): Payer: Commercial Managed Care - HMO

## 2022-07-27 DIAGNOSIS — M5416 Radiculopathy, lumbar region: Secondary | ICD-10-CM

## 2022-07-27 DIAGNOSIS — M79604 Pain in right leg: Secondary | ICD-10-CM | POA: Diagnosis not present

## 2022-07-27 DIAGNOSIS — G8929 Other chronic pain: Secondary | ICD-10-CM | POA: Diagnosis not present

## 2022-07-27 DIAGNOSIS — M545 Low back pain, unspecified: Secondary | ICD-10-CM | POA: Diagnosis not present

## 2022-07-30 ENCOUNTER — Other Ambulatory Visit: Payer: Self-pay | Admitting: Orthopaedic Surgery

## 2022-07-30 ENCOUNTER — Ambulatory Visit (INDEPENDENT_AMBULATORY_CARE_PROVIDER_SITE_OTHER): Payer: Commercial Managed Care - HMO

## 2022-07-30 DIAGNOSIS — M79604 Pain in right leg: Secondary | ICD-10-CM

## 2022-07-30 DIAGNOSIS — M25551 Pain in right hip: Secondary | ICD-10-CM | POA: Diagnosis not present

## 2022-09-09 ENCOUNTER — Other Ambulatory Visit: Payer: Self-pay | Admitting: Adult Health

## 2022-09-09 DIAGNOSIS — F3175 Bipolar disorder, in partial remission, most recent episode depressed: Secondary | ICD-10-CM

## 2022-09-09 DIAGNOSIS — F4312 Post-traumatic stress disorder, chronic: Secondary | ICD-10-CM

## 2022-09-19 ENCOUNTER — Encounter: Payer: Self-pay | Admitting: Adult Health

## 2022-09-19 ENCOUNTER — Ambulatory Visit (INDEPENDENT_AMBULATORY_CARE_PROVIDER_SITE_OTHER): Payer: Commercial Managed Care - HMO | Admitting: Adult Health

## 2022-09-19 DIAGNOSIS — F411 Generalized anxiety disorder: Secondary | ICD-10-CM | POA: Diagnosis not present

## 2022-09-19 DIAGNOSIS — F3175 Bipolar disorder, in partial remission, most recent episode depressed: Secondary | ICD-10-CM | POA: Diagnosis not present

## 2022-09-19 DIAGNOSIS — F451 Undifferentiated somatoform disorder: Secondary | ICD-10-CM | POA: Diagnosis not present

## 2022-09-19 DIAGNOSIS — F4312 Post-traumatic stress disorder, chronic: Secondary | ICD-10-CM

## 2022-09-19 MED ORDER — HYDROXYZINE HCL 25 MG PO TABS: ORAL_TABLET | ORAL | 2 refills | Status: DC

## 2022-09-19 MED ORDER — LAMOTRIGINE 200 MG PO TABS: 200.0000 mg | ORAL_TABLET | Freq: Every day | ORAL | 5 refills | Status: DC

## 2022-09-19 MED ORDER — DIAZEPAM 10 MG PO TABS: 10.0000 mg | ORAL_TABLET | Freq: Three times a day (TID) | ORAL | 2 refills | Status: DC | PRN

## 2022-09-19 NOTE — Progress Notes (Signed)
Shannon Spears PO:[id number] 1981/02/21 42 y.o.  Subjective:   Patient ID:  Shannon Spears is a 42 y.o. (DOB April 27, 1981) female.  Chief Complaint: No chief complaint on file.   HPI Shannon Spears presents to the office today for follow-up of BPD-1, PTSD, anxiety, and somatic symptom disorder.  Describes mood today as "not good". Pleasant. Tearful. Mood symptoms - denies depression. Reports anxiety and irritability. Reports increased worry, rumination, and over thinking. Reports mood is lower. Reports increased situational stressors - family. Reports ongoing pain issues - right foot - wearing a boot x 6 months. Concerned about her family. Daughter with mental health issues. Stable interest and motivation. Taking medications as prescribed.  Energy levels variable. Active, does not have a regular exercise routine with physical disabilities. Enjoys some usual interests and activities. Married. Lives with husband and daughter. Spending time with family. Appetite adequate. Weight loss. Sleeps better some nights than others. Averages 4 to 6 hours at night and naps 1 to 2 hours during the day. Focus and concentration stable. Completing tasks. Managing aspects of household. Stay at home mom. Denies SI or HI.  Denies AH or VH. Denies self harm.  Denies substance use.  Followed by pain management - [facility].  Previous medication trials: Unknown  GAD-7    Flowsheet Row Office Visit from 02/01/2019 in [facility]  Total GAD-7 Score 5      PHQ2-9    [place] Office Visit from 02/01/2019 in [facility] Total Score 0  PHQ-9 Total Score 5        Review of Systems:  Review of Systems  Musculoskeletal:  Negative for gait problem.  Neurological:  Negative for tremors.  Psychiatric/Behavioral:         Please refer to HPI    Medications: I have reviewed the patient's current medications.  Current Outpatient Medications   Medication Sig Dispense Refill   albuterol (PROVENTIL) (2.5 MG/3ML) 0.083% nebulizer solution Take 3 mLs (2.5 mg total) by nebulization every 6 (six) hours as needed for wheezing or shortness of breath. 150 mL 1   albuterol (VENTOLIN HFA) 108 (90 Base) MCG/ACT inhaler Inhale 2 puffs into the lungs every 6 (six) hours as needed for wheezing or shortness of breath. 8 g 2   cefUROXime (CEFTIN) 500 MG tablet Take 1 tablet (500 mg total) by mouth 2 (two) times daily with a meal. 40 tablet 0   cyclobenzaprine (FLEXERIL) 10 MG tablet Take 1 tablet (10 mg total) by mouth 3 (three) times daily as needed for muscle spasms. 60 tablet 1   diazepam (VALIUM) 10 MG tablet Take 1 tablet (10 mg total) by mouth 3 (three) times daily as needed for anxiety. 90 tablet 2   famotidine (PEPCID AC MAXIMUM STRENGTH) 20 MG tablet Take 20 mg by mouth daily.     hydrOXYzine (ATARAX) 25 MG tablet TAKE 1 TABLET BY MOUTH UP TO 6 TIMES DAILY AS NEEDED FOR ANXIETY, ITCHING, AND INSOMNIA. 180 tablet 2   Lactobacillus (PROBIOTIC ACIDOPHILUS PO) Take 1 capsule by mouth daily.     lamoTRIgine (LAMICTAL) 200 MG tablet Take 1 tablet (200 mg total) by mouth at bedtime. 30 tablet 5   ondansetron (ZOFRAN ODT) 4 MG disintegrating tablet Take 1 tablet (4 mg total) by mouth every 8 (eight) hours as needed for nausea or vomiting. 8 tablet 0   No current facility-administered medications for this visit.    Medication Side Effects:  None  Allergies:  Allergies  Allergen Reactions   Buprenorphine Shortness Of Breath and Rash    Breathing problems   Penicillins Nausea And Vomiting, Swelling and Anaphylaxis   Dermatitis Antigen     Other reaction(s): Dermatitis  Tape   Gabapentin Nausea And Vomiting   Hydrocodone-Acetaminophen     Other reaction(s): Other Other Reaction: Other reaction   Nsaids     Other reaction(s): Other Renal insufficiency   Other Dermatitis    Tape   Propoxyphene Nausea Only    darvocet   Tetanus Toxoids      Blister on legs Other reaction(s): Other Blister on legs   Adhesive [Tape]    Codeine     Mouth went numb   Ibuprofen     Has kidney disease.   Doxycycline Rash    Past Medical History:  Diagnosis Date   Anxiety    Bipolar 1 disorder, manic, mild (HCC)    CKD (chronic kidney disease)    Depression    Fibromyalgia    GERD (gastroesophageal reflux disease)    Headache(784.0)     Past Medical History, Surgical history, Social history, and Family history were reviewed and updated as appropriate.   Please see review of systems for further details on the patient's review from today.   Objective:   Physical Exam:  LMP 10/02/2012   Physical Exam Constitutional:      General: She is not in acute distress. Musculoskeletal:        General: No deformity.  Neurological:     Mental Status: She is alert and oriented to person, place, and time.     Coordination: Coordination normal.  Psychiatric:        Attention and Perception: Attention and perception normal. She does not perceive auditory or visual hallucinations.        Mood and Affect: Mood normal. Mood is not anxious or depressed. Affect is not labile, blunt, angry or inappropriate.        Speech: Speech normal.        Behavior: Behavior normal.        Thought Content: Thought content normal. Thought content is not paranoid or delusional. Thought content does not include homicidal or suicidal ideation. Thought content does not include homicidal or suicidal plan.        Cognition and Memory: Cognition and memory normal.        Judgment: Judgment normal.     Comments: Insight intact     Lab Review:     Component Value Date/Time   NA 135 02/01/2019 1012   K 5.0 02/01/2019 1012   CL 98 02/01/2019 1012   CO2 25 02/01/2019 1012   GLUCOSE 87 02/01/2019 1012   GLUCOSE 104 (H) 11/14/2016 0502   BUN 8 02/01/2019 1012   CREATININE 0.70 02/01/2019 1012   CALCIUM 9.2 02/01/2019 1012   PROT 6.9 02/01/2019 1012   ALBUMIN 4.1  02/01/2019 1012   AST 22 02/01/2019 1012   ALT 22 02/01/2019 1012   ALKPHOS 180 (H) 02/01/2019 1012   BILITOT 0.3 02/01/2019 1012   GFRNONAA 110 02/01/2019 1012   GFRAA 127 02/01/2019 1012       Component Value Date/Time   WBC 13.0 (H) 02/01/2019 1012   WBC 10.2 02/26/2017 1115   RBC 4.36 02/01/2019 1012   RBC 4.43 02/26/2017 1115   HGB 14.2 02/01/2019 1012   HCT 41.8 02/01/2019 1012   PLT 518 (H) 02/01/2019 1012   MCV 96 02/01/2019 1012  MCH 32.6 02/01/2019 1012   MCH 32.3 02/26/2017 1115   MCHC 34.0 02/01/2019 1012   MCHC 33.5 02/26/2017 1115   RDW 13.6 02/01/2019 1012   LYMPHSABS 3.7 (H) 02/01/2019 1012   MONOABS 510 02/26/2017 1115   EOSABS 0.3 02/01/2019 1012   BASOSABS 0.1 02/01/2019 1012    No results found for: "POCLITH", "LITHIUM"   No results found for: "PHENYTOIN", "PHENOBARB", "VALPROATE", "CBMZ"   .res Assessment: Plan:    Plan:  PDMP reviewed  1. Lamictal 250m daily 2. Valium 127mTID 3. Atarax 256mp to 6 times daily  RTC 3 months  Patient advised to contact office with any questions, adverse effects, or acute worsening in signs and symptoms.  Discussed potential benefits, risk, and side effects of benzodiazepines to include potential risk of tolerance and dependence, as well as possible drowsiness.  Advised patient not to drive if experiencing drowsiness and to take lowest possible effective dose to minimize risk of dependence and tolerance.  Counseled patient regarding potential benefits, risks, and side effects of Lamictal to include potential risk of Stevens-Johnson syndrome. Advised patient to stop taking Lamictal and contact office immediately if rash develops and to seek urgent medical attention if rash is severe and/or spreading quickly.  Diagnoses and all orders for this visit:  Bipolar I disorder, current or most recent episode depressed, in partial remission with mixed features (HCC) -     lamoTRIgine (LAMICTAL) 200 MG tablet; Take  1 tablet (200 mg total) by mouth at bedtime. -     diazepam (VALIUM) 10 MG tablet; Take 1 tablet (10 mg total) by mouth 3 (three) times daily as needed for anxiety.  Chronic post-traumatic stress disorder -     lamoTRIgine (LAMICTAL) 200 MG tablet; Take 1 tablet (200 mg total) by mouth at bedtime. -     hydrOXYzine (ATARAX) 25 MG tablet; TAKE 1 TABLET BY MOUTH UP TO 6 TIMES DAILY AS NEEDED FOR ANXIETY, ITCHING, AND INSOMNIA. -     diazepam (VALIUM) 10 MG tablet; Take 1 tablet (10 mg total) by mouth 3 (three) times daily as needed for anxiety.  Somatic symptom disorder -     hydrOXYzine (ATARAX) 25 MG tablet; TAKE 1 TABLET BY MOUTH UP TO 6 TIMES DAILY AS NEEDED FOR ANXIETY, ITCHING, AND INSOMNIA.  Generalized anxiety disorder     Please see After Visit Summary for patient specific instructions.  No future appointments.  No orders of the defined types were placed in this encounter.   -------------------------------

## 2022-10-16 ENCOUNTER — Ambulatory Visit (INDEPENDENT_AMBULATORY_CARE_PROVIDER_SITE_OTHER): Payer: Commercial Managed Care - HMO | Admitting: Orthopaedic Surgery

## 2022-10-16 DIAGNOSIS — M84351A Stress fracture, right femur, initial encounter for fracture: Secondary | ICD-10-CM | POA: Diagnosis not present

## 2022-10-16 DIAGNOSIS — M25571 Pain in right ankle and joints of right foot: Secondary | ICD-10-CM

## 2022-10-16 NOTE — Progress Notes (Signed)
Office Visit Note   Patient: Shannon Spears           Date of Birth: [birth year]           MRN: [id number] Visit Date: 10/16/2022              Requested by: Lilian Coma., MD [address],  [place] [address] PCP: Lilian Coma., MD   Assessment & Plan: Visit Diagnoses:  1. Stress fracture of neck of right femur   2. Pain in right ankle and joints of right foot     Plan: Impression is chronic right hip and right ankle pain.  In regards to her right hip, she does have evidence of stress fracture of the superior and inferior region of the femoral neck.  We have discussed surgical intervention at this point.  She is in agreement with this plan.  In regards to the ankle, we have placed her in a new cam boot today.  We will have her follow-up with Dr. Sharol Given for further evaluation and treatment recommendation.  Follow-Up Instructions: Return if symptoms worsen or fail to improve.   Orders:  No orders of the defined types were placed in this encounter.  No orders of the defined types were placed in this encounter.     Procedures: No procedures performed   Clinical Data: No additional findings.   Subjective: Chief Complaint  Patient presents with   Right Foot - Pain   Right Hip - Pain    HPI patient is a pleasant 42 year old female who comes in today with right hip and right ankle pain.  She is here for second opinion.  The pain in both places began back in September when her dog pulled her down her set of stairs.  She notes that her husband caught her halfway down.  In regards to the hip, she has had pain to the groin and lateral hip.  Pain is constant with associated swelling.  Symptoms are worse when she is on her feet all day as well as when she climbs stairs.  She has been taking Tylenol and Flexeril without relief.  She has been seen by [facility] where MRI of the pelvis was obtained.  MRI dated 07/31/2022 showed mild periosteal edema of the  medial aspect of the femoral neck as well as minimal marrow edema consistent with stress fracture.  She tells me she was scheduled for what sounds like hip pinning last month but that the hospital told her they did not take her insurance plan.  In regards to the ankle, she has had pain to the anterior aspect since this injury.  She has associated swelling.  Any movement of the ankle especially with walking seems to worsen her symptoms.  She has been wearing a cam boot without significant relief.  She has been working on a home exercise program on her own.  MRI of the right ankle from 07/30/2022 shows significant thinning of the ATFL consistent with a chronic partial tear.  Patient notes previous ankle injury but never underwent surgery or bracing.  Review of Systems as detailed in HPI.  All others reviewed and are negative.   Objective: Vital Signs: LMP 10/02/2012   Physical Exam well-developed well-nourished female in no acute distress.  Alert and oriented x 3.  Ortho Exam right hip exam reveals pain with logroll, FADIR and Stinchfield testing.  Right ankle exam reveals no swelling.  She does have tenderness along the ATFL.  Limited range of motion secondary to pain.  Minimal medial lateral ankle tenderness.  She is neurovascularly intact distally.  Specialty Comments:  No specialty comments available.  Imaging: No new imaging   PMFS History: Patient Active Problem List   Diagnosis Date Noted   Knee contusion 04/06/2019   Bipolar I disorder, current or most recent episode depressed, in partial remission with mixed features ([place]) 06/16/2018   Chronic post-traumatic stress disorder 06/16/2018   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   Opioid use disorder, mild, in sustained remission ([place]) 06/16/2018   S/P cholecystectomy 02/10/2018   S/P cervical spinal fusion 12/08/2017   Protrusion of cervical intervertebral disc 12/08/2017   HNP (herniated nucleus pulposus), cervical 10/02/2012     Class: Diagnosis of   Past Medical History:  Diagnosis Date   Anxiety    Bipolar 1 disorder, manic, mild (HCC)    CKD (chronic kidney disease)    Depression    Fibromyalgia    GERD (gastroesophageal reflux disease)    Headache(784.0)     Family History  Problem Relation Age of Onset   Bipolar disorder Mother    Diabetes Mother    Hypertension Mother    Hypertension Father    Colon cancer Paternal Grandmother     Past Surgical History:  Procedure Laterality Date   ANTERIOR CERVICAL DECOMP/DISCECTOMY FUSION N/A 10/02/2012   Procedure: C4-5, C5-6 anterior cervical discectomy and fusion, allograft plate;  Surgeon: Marybelle Killings, MD;  Location: [facility];  Service: Orthopedics;  Laterality: N/A;   CYSTOGRAPHY     LAPAROSCOPIC TOTAL HYSTERECTOMY     OOPHORECTOMY     TUBAL LIGATION     Social History   Occupational History   Not on file  Tobacco Use   Smoking status: Every Day    Packs/day: 0.50    Types: Cigarettes   Smokeless tobacco: Never  Substance and Sexual Activity   Alcohol use: Yes    Comment: rarely   Drug use: No   Sexual activity: Not on file

## 2022-10-22 ENCOUNTER — Ambulatory Visit: Payer: Commercial Managed Care - HMO | Admitting: Orthopedic Surgery

## 2022-10-22 ENCOUNTER — Encounter: Payer: Self-pay | Admitting: Orthopedic Surgery

## 2022-10-22 DIAGNOSIS — M25871 Other specified joint disorders, right ankle and foot: Secondary | ICD-10-CM

## 2022-10-22 DIAGNOSIS — S93491A Sprain of other ligament of right ankle, initial encounter: Secondary | ICD-10-CM

## 2022-10-22 NOTE — Progress Notes (Signed)
Office Visit Note   Patient: Shannon Spears           Date of Birth: Jan 02, 1981           MRN: [id number] Visit Date: 10/22/2022              Requested by: Lilian Coma., MD [address] [place],  [place] [address] PCP: Lilian Coma., MD  Chief Complaint  Patient presents with   Right Ankle - Pain      HPI: Patient is a 42 year old woman who was seen for initial evaluation for right ankle pain and instability.  Patient states she has undergone physical therapy several times as well as wearing a fracture boot for about 6 months.  Assessment & Plan: Visit Diagnoses:  1. Sprain of anterior talofibular ligament of right ankle, initial encounter   2. Impingement syndrome of right ankle     Plan: Patient states that she will be following up with Dr. Erlinda Hong for stabilization of a stress fracture of her hip.  Patient states she would like to proceed with reconstruction of the right ankle afterwards.  Patient will need a Gould modified roaster anterior talofibular ligament reconstruction on the right with ankle arthroscopy for debridement for impingement.  Risk and benefits were discussed including persistent pain need for additional surgery.  The importance of smoking cessation was discussed.  Patient states she understands we will proceed with surgery after she is stable from a hip standpoint.  Follow-Up Instructions: No follow-ups on file.   Ortho Exam  Patient is alert, oriented, no adenopathy, well-dressed, normal affect, normal respiratory effort. Examination patient has a palpable dorsalis pedis pulse.  Anterior drawer has 2 mm of laxity and is painful with anterior drawer she is tender to palpation over the anterior talofibular ligament.  Patient is also tender to palpation anteriorly over the ankle including the medial joint line and lateral joint line.  Review of the MRI scan shows no osteochondral defects does show thinning of the anterior talofibular ligament.   Patient states that she is been on prednisone in the past has complications from this and does not want to consider an intra-articular injection.  Imaging: No results found. No images are attached to the encounter.  Labs: Lab Results  Component Value Date   HGBA1C 5.4 02/01/2019   ESRSEDRATE 30 (H) 02/26/2017   LABURIC 4.6 02/26/2017     Lab Results  Component Value Date   ALBUMIN 4.1 02/01/2019   ALBUMIN 3.5 09/23/2012    No results found for: "MG" No results found for: "VD25OH"  No results found for: "PREALBUMIN"    Latest Ref Rng & Units 02/01/2019   10:12 AM 02/26/2017   11:15 AM 11/14/2016    5:02 AM  CBC EXTENDED  WBC 3.4 - 10.8 x10E3/uL 13.0  10.2  9.1   RBC 3.77 - 5.28 x10E6/uL 4.36  4.43  4.38   Hemoglobin 11.1 - 15.9 g/dL 14.2  14.3  13.5   HCT 34.0 - 46.6 % 41.8  42.7  40.1   Platelets 150 - 450 x10E3/uL 518  523  455   NEUT# 1.4 - 7.0 x10E3/uL 8.3  6,120    Lymph# 0.7 - 3.1 x10E3/uL 3.7  3,366       There is no height or weight on file to calculate BMI.  Orders:  No orders of the defined types were placed in this encounter.  No orders of the defined types were placed in this  encounter.    Procedures: No procedures performed  Clinical Data: No additional findings.  ROS:  All other systems negative, except as noted in the HPI. Review of Systems  Objective: Vital Signs: LMP 10/02/2012   Specialty Comments:  No specialty comments available.  PMFS History: Patient Active Problem List   Diagnosis Date Noted   Knee contusion 04/06/2019   Bipolar I disorder, current or most recent episode depressed, in partial remission with mixed features ([place]) 06/16/2018   Chronic post-traumatic stress disorder 06/16/2018   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   Opioid use disorder, mild, in sustained remission ([place]) 06/16/2018   S/P cholecystectomy 02/10/2018   S/P cervical spinal fusion 12/08/2017   Protrusion of cervical intervertebral disc 12/08/2017    HNP (herniated nucleus pulposus), cervical 10/02/2012    Class: Diagnosis of   Past Medical History:  Diagnosis Date   Anxiety    Bipolar 1 disorder, manic, mild (HCC)    CKD (chronic kidney disease)    Depression    Fibromyalgia    GERD (gastroesophageal reflux disease)    Headache(784.0)     Family History  Problem Relation Age of Onset   Bipolar disorder Mother    Diabetes Mother    Hypertension Mother    Hypertension Father    Colon cancer Paternal Grandmother     Past Surgical History:  Procedure Laterality Date   ANTERIOR CERVICAL DECOMP/DISCECTOMY FUSION N/A 10/02/2012   Procedure: C4-5, C5-6 anterior cervical discectomy and fusion, allograft plate;  Surgeon: Marybelle Killings, MD;  Location: [facility];  Service: Orthopedics;  Laterality: N/A;   CYSTOGRAPHY     LAPAROSCOPIC TOTAL HYSTERECTOMY     OOPHORECTOMY     TUBAL LIGATION     Social History   Occupational History   Not on file  Tobacco Use   Smoking status: Every Day    Packs/day: .5    Types: Cigarettes   Smokeless tobacco: Never  Substance and Sexual Activity   Alcohol use: Yes    Comment: rarely   Drug use: No   Sexual activity: Not on file

## 2022-10-31 ENCOUNTER — Telehealth: Payer: Self-pay | Admitting: Orthopedic Surgery

## 2022-10-31 ENCOUNTER — Encounter: Payer: Self-pay | Admitting: Orthopaedic Surgery

## 2022-10-31 NOTE — Telephone Encounter (Signed)
Patient would like to wait on scheduling the ankle surgery with Dr Sharol Given until after the hip pinning with Dr. Erlinda Hong.  She still has a lot of questions regarding the recovery time on the hip.  She is currently scheduled for the hip pinning on 11-22-22.

## 2022-11-01 ENCOUNTER — Encounter: Payer: Self-pay | Admitting: Orthopedic Surgery

## 2022-11-04 NOTE — Telephone Encounter (Signed)
Autumn can you forward this question to Dr. Sharol Given about wearing the boot after surgery.  Thanks.

## 2022-11-13 NOTE — Progress Notes (Signed)
Surgical Instructions    Your procedure is scheduled on Friday, 11/22/22.  Report to [facility] Main Entrance "A" at 9:45 A.M., then check in with the Admitting office.  Call this number if you have problems the morning of surgery:  [phone]   If you have any questions prior to your surgery date call [phone]: Open Monday-Friday 8am-4pm If you experience any cold or flu symptoms such as cough, fever, chills, shortness of breath, etc. between now and your scheduled surgery, please notify us at the above number     Remember:  Do not eat after midnight the night before your surgery  You may drink clear liquids until 9:15am the morning of your surgery.   Clear liquids allowed are: Water, Non-Citrus Juices (without pulp), Carbonated Beverages, Clear Tea, Black Coffee ONLY (NO MILK, CREAM OR POWDERED CREAMER of any kind), and Gatorade    Take these medicines the morning of surgery with A SIP OF WATER:  cetirizine (ZYRTEC)  famotidine (PEPCID AC MAXIMUM STRENGTH)  levETIRAcetam (KEPPRA)  metoprolol tartrate (LOPRESSOR)   IF NEEDED: albuterol (PROVENTIL) (2.5 MG/3ML) nebulizer and inhaler- bring inhaler with you the day of surgery cyclobenzaprine (FLEXERIL)  diazepam (VALIUM)  hydrOXYzine (ATARAX)    As of today, STOP taking any Aspirin (unless otherwise instructed by your surgeon) Aleve, Naproxen, Ibuprofen, Motrin, Advil, Goody's, BC's, all herbal medications, fish oil, and all vitamins.           Do not wear jewelry or makeup. Do not wear lotions, powders, perfumes or deodorant. Do not shave 48 hours prior to surgery.   Do not bring valuables to the hospital. Do not wear nail polish, gel polish, artificial nails, or any other type of covering on natural nails (fingers and toes) If you have artificial nails or gel coating that need to be removed by a nail salon, please have this removed prior to surgery. Artificial nails or gel coating may interfere with anesthesia's ability  to adequately monitor your vital signs.  [facility] is not responsible for any belongings or valuables.    Do NOT Smoke (Tobacco/Vaping)  24 hours prior to your procedure  If you use a CPAP at night, you may bring your mask for your overnight stay.   Contacts, glasses, hearing aids, dentures or partials may not be worn into surgery, please bring cases for these belongings   For patients admitted to the hospital, discharge time will be determined by your treatment team.   Patients discharged the day of surgery will not be allowed to drive home, and someone needs to stay with them for 24 hours.   SURGICAL WAITING ROOM VISITATION Patients having surgery or a procedure may have no more than 2 support people in the waiting area - these visitors may rotate.   Children under the age of 84 must have an adult with them who is not the patient. If the patient needs to stay at the hospital during part of their recovery, the visitor guidelines for inpatient rooms apply. Pre-op nurse will coordinate an appropriate time for 1 support person to accompany patient in pre-op.  This support person may not rotate.   Please refer to [email or web address] for the visitor guidelines for Inpatients (after your surgery is over and you are in a regular room).    Special instructions:    Oral Hygiene is also important to reduce your risk of infection.  Remember - BRUSH YOUR TEETH THE MORNING OF SURGERY WITH YOUR REGULAR TOOTHPASTE   - Preparing  For Surgery  Before surgery, you can play an important role. Because skin is not sterile, your skin needs to be as free of germs as possible. You can reduce the number of germs on your skin by washing with CHG (chlorahexidine gluconate) Soap before surgery.  CHG is an antiseptic cleaner which kills germs and bonds with the skin to continue killing germs even after washing.     Please do not use if you have an  allergy to CHG or antibacterial soaps. If your skin becomes reddened/irritated stop using the CHG.  Do not shave (including legs and underarms) for at least 48 hours prior to first CHG shower. It is OK to shave your face.  Please follow these instructions carefully.     Shower the NIGHT BEFORE SURGERY and the MORNING OF SURGERY with CHG Soap.   If you chose to wash your hair, wash your hair first as usual with your normal shampoo. After you shampoo, rinse your hair and body thoroughly to remove the shampoo.  Then [employer] and genitals (private parts) with your normal soap and rinse thoroughly to remove soap.  After that Use CHG Soap as you would any other liquid soap. You can apply CHG directly to the skin and wash gently with a scrungie or a clean washcloth.   Apply the CHG Soap to your body ONLY FROM THE NECK DOWN.  Do not use on open wounds or open sores. Avoid contact with your eyes, ears, mouth and genitals (private parts). Wash Face and genitals (private parts)  with your normal soap.   Wash thoroughly, paying special attention to the area where your surgery will be performed.  Thoroughly rinse your body with warm water from the neck down.  DO NOT shower/wash with your normal soap after using and rinsing off the CHG Soap.  Pat yourself dry with a CLEAN TOWEL.  Wear CLEAN PAJAMAS to bed the night before surgery  Place CLEAN SHEETS on your bed the night before your surgery  DO NOT SLEEP WITH PETS.   Day of Surgery: Take a shower with CHG soap. Wear Clean/Comfortable clothing the morning of surgery Do not apply any deodorants/lotions.   Remember to brush your teeth WITH YOUR REGULAR TOOTHPASTE.    If you received a COVID test during your pre-op visit, it is requested that you wear a mask when out in public, stay away from anyone that may not be feeling well, and notify your surgeon if you develop symptoms. If you have been in contact with anyone that has tested positive in the  last 10 days, please notify your surgeon.    Please read over the following fact sheets that you were given.

## 2022-11-14 ENCOUNTER — Other Ambulatory Visit: Payer: Self-pay

## 2022-11-14 ENCOUNTER — Encounter (HOSPITAL_COMMUNITY)
Admission: RE | Admit: 2022-11-14 | Discharge: 2022-11-14 | Disposition: A | Discharge status: Home / Self Care | Payer: Commercial Managed Care - HMO | Source: Ambulatory Visit | Attending: Orthopaedic Surgery | Admitting: Orthopaedic Surgery

## 2022-11-14 ENCOUNTER — Encounter (HOSPITAL_COMMUNITY): Payer: Self-pay

## 2022-11-14 VITALS — BP 98/86 | HR 92 | Temp 98.2°F | Resp 18 | Ht 61.0 in | Wt 185.0 lb

## 2022-11-14 DIAGNOSIS — F17200 Nicotine dependence, unspecified, uncomplicated: Secondary | ICD-10-CM | POA: Insufficient documentation

## 2022-11-14 DIAGNOSIS — M25551 Pain in right hip: Secondary | ICD-10-CM | POA: Insufficient documentation

## 2022-11-14 DIAGNOSIS — R002 Palpitations: Secondary | ICD-10-CM | POA: Insufficient documentation

## 2022-11-14 DIAGNOSIS — I2510 Atherosclerotic heart disease of native coronary artery without angina pectoris: Secondary | ICD-10-CM | POA: Insufficient documentation

## 2022-11-14 DIAGNOSIS — X58XXXA Exposure to other specified factors, initial encounter: Secondary | ICD-10-CM | POA: Insufficient documentation

## 2022-11-14 DIAGNOSIS — M84359A Stress fracture, hip, unspecified, initial encounter for fracture: Secondary | ICD-10-CM | POA: Insufficient documentation

## 2022-11-14 DIAGNOSIS — Z01818 Encounter for other preprocedural examination: Secondary | ICD-10-CM | POA: Insufficient documentation

## 2022-11-14 HISTORY — DX: Unspecified osteoarthritis, unspecified site: M19.90

## 2022-11-14 HISTORY — DX: Palpitations: R00.2

## 2022-11-14 LAB — CBC
HCT: 42.7 % (ref 36.0–46.0)
Hemoglobin: 13.7 g/dL (ref 12.0–15.0)
MCH: 28.4 pg (ref 26.0–34.0)
MCHC: 32.1 g/dL (ref 30.0–36.0)
MCV: 88.6 fL (ref 80.0–100.0)
Platelets: 483 10*3/uL — ABNORMAL HIGH (ref 150–400)
RBC: 4.82 MIL/uL (ref 3.87–5.11)
RDW: 13.2 % (ref 11.5–15.5)
WBC: 12.3 10*3/uL — ABNORMAL HIGH (ref 4.0–10.5)
nRBC: 0 % (ref 0.0–0.2)

## 2022-11-14 LAB — BASIC METABOLIC PANEL
Anion gap: 10 (ref 5–15)
BUN: 10 mg/dL (ref 6–20)
CO2: 27 mmol/L (ref 22–32)
Calcium: 8.8 mg/dL — ABNORMAL LOW (ref 8.9–10.3)
Chloride: 100 mmol/L (ref 98–111)
Creatinine, Ser: 0.92 mg/dL (ref 0.44–1.00)
GFR, Estimated: >60 mL/min (ref >60)
Glucose, Bld: 97 mg/dL (ref 70–99)
Potassium: 4.2 mmol/L (ref 3.5–5.1)
Sodium: 137 mmol/L (ref 135–145)

## 2022-11-14 NOTE — Pre-Procedure Instructions (Addendum)
Surgical Instructions    Your procedure is scheduled on Friday, November 22, 2022 at 12:15 PM.  Report to [facility] Main Entrance "A" at 9:45 A.M., then check in with the Admitting office.  Call this number if you have problems the morning of surgery:  [phone]   If you have any questions prior to your surgery date call [phone]: Open Monday-Friday 8am-4pm If you experience any cold or flu symptoms such as cough, fever, chills, shortness of breath, etc. between now and your scheduled surgery, please notify us at the above number     Remember:  Do not eat after midnight the night before your surgery  You may drink clear liquids until 9:15am the morning of your surgery.   Clear liquids allowed are: Water, Non-Citrus Juices (without pulp), Carbonated Beverages, Clear Tea, Black Coffee ONLY (NO MILK, CREAM OR POWDERED CREAMER of any kind), and Gatorade  Patient Instructions  The night before surgery:  No food after midnight. ONLY clear liquids after midnight  The day of surgery (if you do NOT have diabetes):  Drink ONE (1) Pre-Surgery Clear Ensure by 9:15 AM the morning of surgery. Drink in one sitting. Do not sip.  This drink was given to you during your hospital  pre-op appointment visit.  Nothing else to drink after completing the  Pre-Surgery Clear Ensure.         If you have questions, please contact your surgeon's office.     Take these medicines the morning of surgery with A SIP OF WATER:  cetirizine (ZYRTEC)  famotidine (PEPCID AC MAXIMUM STRENGTH)  levETIRAcetam (KEPPRA)  metoprolol tartrate (LOPRESSOR)   IF NEEDED: albuterol (PROVENTIL) (2.5 MG/3ML) nebulizer and inhaler- bring inhaler with you the day of surgery cyclobenzaprine (FLEXERIL)  diazepam (VALIUM)  hydrOXYzine (ATARAX)    As of today, STOP taking any Aspirin (unless otherwise instructed by your surgeon) Aleve, Naproxen, Ibuprofen, Motrin, Advil, Goody's, BC's, all herbal medications, fish oil, and  all vitamins.  Do NOT Smoke (Tobacco/Vaping)  24 hours prior to your procedure  If you use a CPAP at night, you may bring your mask for your overnight stay.   Contacts, glasses, hearing aids, dentures or partials may not be worn into surgery, please bring cases for these belongings   For patients admitted to the hospital, discharge time will be determined by your treatment team.   Patients discharged the day of surgery will not be allowed to drive home, and someone needs to stay with them for 24 hours.   SURGICAL WAITING ROOM VISITATION Patients having surgery or a procedure may have no more than 2 support people in the waiting area - these visitors may rotate.   Children under the age of 39 must have an adult with them who is not the patient. If the patient needs to stay at the hospital during part of their recovery, the visitor guidelines for inpatient rooms apply. Pre-op nurse will coordinate an appropriate time for 1 support person to accompany patient in pre-op.  This support person may not rotate.   Please refer to [email or web address] for the visitor guidelines for Inpatients (after your surgery is over and you are in a regular room).    Special instructions:    Oral Hygiene is also important to reduce your risk of infection.  Remember - BRUSH YOUR TEETH THE MORNING OF SURGERY WITH YOUR REGULAR TOOTHPASTE   [place]- Preparing For Surgery  Before surgery, you can play an important role. Because skin is not sterile,  your skin needs to be as free of germs as possible. You can reduce the number of germs on your skin by washing with CHG (chlorahexidine gluconate) Soap before surgery.  CHG is an antiseptic cleaner which kills germs and bonds with the skin to continue killing germs even after washing.     Please do not use if you have an allergy to CHG or antibacterial soaps. If your skin becomes reddened/irritated stop using the  CHG.  Do not shave (including legs and underarms) for at least 48 hours prior to first CHG shower. It is OK to shave your face.  Please follow these instructions carefully.     Shower the NIGHT BEFORE SURGERY and the MORNING OF SURGERY with CHG Soap.   If you chose to wash your hair, wash your hair first as usual with your normal shampoo. After you shampoo, rinse your hair and body thoroughly to remove the shampoo.  Then [employer] and genitals (private parts) with your normal soap and rinse thoroughly to remove soap.  After that Use CHG Soap as you would any other liquid soap. You can apply CHG directly to the skin and wash gently with a scrungie or a clean washcloth.   Apply the CHG Soap to your body ONLY FROM THE NECK DOWN.  Do not use on open wounds or open sores. Avoid contact with your eyes, ears, mouth and genitals (private parts). Wash Face and genitals (private parts)  with your normal soap.   Wash thoroughly, paying special attention to the area where your surgery will be performed.  Thoroughly rinse your body with warm water from the neck down.  DO NOT shower/wash with your normal soap after using and rinsing off the CHG Soap.  Pat yourself dry with a CLEAN TOWEL.  Wear CLEAN PAJAMAS to bed the night before surgery  Place CLEAN SHEETS on your bed the night before your surgery  DO NOT SLEEP WITH PETS.   Day of Surgery: Take a shower with CHG soap. Do not wear jewelry or makeup. Do not wear lotions, powders, perfumes or deodorant. Do not shave 48 hours prior to surgery.   Do not bring valuables to the hospital. [facility] is not responsible for any belongings or valuables.   Do not wear nail polish, gel polish, artificial nails, or any other type of covering on natural nails (fingers and toes) If you have artificial nails or gel coating that need to be removed by a nail salon, pleasehave this removed prior to surgery. Artificial nails or gel coating may interfere with  anesthesia's ability to adequately monitor your vital signs. Wear Clean/Comfortable clothing the morning of surgery Do not apply any deodorants/lotions.   Remember to brush your teeth WITH YOUR REGULAR TOOTHPASTE.  If you received a COVID test during your pre-op visit, it is requested that you wear a mask when out in public, stay away from anyone that may not be feeling well, and notify your surgeon if you develop symptoms. If you have been in contact with anyone that has tested positive in the last 10 days, please notify your surgeon.    Please read over the following fact sheets that you were given.
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
## 2022-11-15 NOTE — Anesthesia Preprocedure Evaluation (Addendum)
Anesthesia Evaluation  Patient identified by MRN, date of birth, ID band Patient awake    Reviewed: Allergy & Precautions, NPO status , Patient's Chart, lab work & pertinent test results, reviewed documented beta blocker date and time   History of Anesthesia Complications Negative for: history of anesthetic complications  Airway Mallampati: II  TM Distance: >3 FB Neck ROM: Full    Dental  (+) Edentulous Upper, Edentulous Lower   Pulmonary Current Smoker and Patient abstained from smoking.   Pulmonary exam normal        Cardiovascular negative cardio ROS Normal cardiovascular exam     Neuro/Psych  Headaches PSYCHIATRIC DISORDERS Anxiety Depression Bipolar Disorder      GI/Hepatic ,GERD  Medicated and Controlled,,(+)     substance abuse  marijuana use  Endo/Other  negative endocrine ROS    Renal/GU CRFRenal disease     Musculoskeletal  (+) Arthritis ,  Fibromyalgia -  Abdominal   Peds  Hematology negative hematology ROS (+)   Anesthesia Other Findings   Reproductive/Obstetrics  s/p hysterectomy                              Anesthesia Physical Anesthesia Plan  ASA: 2  Anesthesia Plan: General   Post-op Pain Management: Tylenol PO (pre-op)* and Ketamine IV*   Induction: Intravenous  PONV Risk Score and Plan: 2 and Treatment may vary due to age or medical condition, Ondansetron, Midazolam and Diphenhydramine  Airway Management Planned: LMA  Additional Equipment: None  Intra-op Plan:   Post-operative Plan: Extubation in OR  Informed Consent: I have reviewed the patients History and Physical, chart, labs and discussed the procedure including the risks, benefits and alternatives for the proposed anesthesia with the patient or authorized representative who has indicated his/her understanding and acceptance.     Dental advisory given  Plan Discussed with: CRNA and  Anesthesiologist  Anesthesia Plan Comments:        Anesthesia Quick Evaluation

## 2022-11-15 NOTE — Progress Notes (Signed)
Anesthesia Chart Review:   Case: [id number] Date/Time: 11/22/22 1200   Procedure: RIGHT HIP PINNING (Right: Hip)   Anesthesia type: General   Pre-op diagnosis: right femoral neck stress fracture   [facility] ROOM 04 / [facility] OR   Surgeons: Tarry Kos, MD       DISCUSSION: Pt is 42 years old with hx palpitations  Current smoker.   VS: BP 98/86   Pulse 92   Temp 36.8 C (Oral)   Resp 18   Ht 5\' 1"  (1.549 m)   Wt 83.9 kg   LMP 10/02/2012   SpO2 100%   BMI 34.96 kg/m   PROVIDERS: - PCP is Malka So., MD - Saw Lisabeth Pick, PA with cardiology at [facility] in 2022 for chest pain and palpitations (notes in care everywhere).  Cardiac monitor and stress echo reassuring, results below  LABS: Labs reviewed: Acceptable for surgery. (all labs ordered are listed, but only abnormal results are displayed)  Labs Reviewed  CBC - Abnormal; Notable for the following components:      Result Value   WBC 12.3 (*)    Platelets 483 (*)    All other components within normal limits  BASIC METABOLIC PANEL - Abnormal; Notable for the following components:   Calcium 8.8 (*)    All other components within normal limits    EKG 11/14/22: NSR. Possible Left atrial enlargement   CV: Stress echo 05/01/21 (care everywhere):  Left Ventricle: Systolic function is normal. EF: 55-60%.    Left Ventricle: Wall motion is normal.    Stress: Total exercise time was 5 minutes. Estimated METs = 9.5.  and a maximal blood pressure of 144/82    Stress: The patient stated that she was having 4 out of 10 chest pain at rest, and that it didn't get worse with exercise.    Stress: In addition, the patient experienced symptoms of shortness of breath during the stress test.    Stress ECG: ECG Conclusion:  No ischemic ST segment changes occurred with stress.    Post-stress: Left ventricle cavity appears normal post-stress.    Post-stress: The left ventricle systolic function is normal post-stress with an EF of  60-65 %.    Post-stress: The post-stress echo showed normal wall motion.    Post-stress Impression: The study is normal.    Post-stress Impression: This study shows a low prognostic risk.   Cardiac event monitor 04/11/21 (care everywhere):  1. Normal sinus rhythm predominates  2. Occasional PACs are noted  3.No significant arrhythmias noted otherwise   Past Medical History:  Diagnosis Date   Anxiety    Arthritis    Bipolar 1 disorder, manic, mild    CKD (chronic kidney disease)    Depression    Fibromyalgia    GERD (gastroesophageal reflux disease)    Headache(784.0)    Palpitations     Past Surgical History:  Procedure Laterality Date   ANTERIOR CERVICAL DECOMP/DISCECTOMY FUSION N/A 10/02/2012   Procedure: C4-5, C5-6 anterior cervical discectomy and fusion, allograft plate;  Surgeon: Eldred Manges, MD;  Location: [facility] OR;  Service: Orthopedics;  Laterality: N/A;   CHOLECYSTECTOMY     CYSTOGRAPHY     LAPAROSCOPIC TOTAL HYSTERECTOMY     OOPHORECTOMY     TUBAL LIGATION      MEDICATIONS:  albuterol (PROVENTIL) (2.5 MG/3ML) 0.083% nebulizer solution   albuterol (VENTOLIN HFA) 108 (90 Base) MCG/ACT inhaler   CALCIUM-VITAMIN D PO   cefUROXime (CEFTIN) 500 MG tablet  cetirizine (ZYRTEC) 10 MG tablet   cyclobenzaprine (FLEXERIL) 10 MG tablet   diazepam (VALIUM) 10 MG tablet   famotidine (PEPCID AC MAXIMUM STRENGTH) 20 MG tablet   hydrOXYzine (ATARAX) 25 MG tablet   Lactobacillus (PROBIOTIC ACIDOPHILUS PO)   lamoTRIgine (LAMICTAL) 200 MG tablet   levETIRAcetam (KEPPRA) 500 MG tablet   lisinopril (ZESTRIL) 2.5 MG tablet   metoprolol tartrate (LOPRESSOR) 25 MG tablet   montelukast (SINGULAIR) 10 MG tablet   ondansetron (ZOFRAN ODT) 4 MG disintegrating tablet   No current facility-administered medications for this encounter.    If no changes, I anticipate pt can proceed with surgery as scheduled.   Rica Mast, PhD, FNP-BC [facility] Short Stay Surgical  Center/Anesthesiology Phone: [phone] 11/15/2022 2:58 PM

## 2022-11-21 MED ORDER — TRANEXAMIC ACID 1000 MG/10ML IV SOLN
2000.0000 mg | INTRAVENOUS | Status: DC
  Filled 2022-11-21: qty 20

## 2022-11-22 ENCOUNTER — Ambulatory Visit (HOSPITAL_COMMUNITY)
Admission: RE | Admit: 2022-11-22 | Discharge: 2022-11-22 | Disposition: A | Discharge status: Home / Self Care | Payer: Commercial Managed Care - HMO | Attending: Orthopaedic Surgery | Admitting: Orthopaedic Surgery

## 2022-11-22 ENCOUNTER — Encounter (HOSPITAL_COMMUNITY): Payer: Self-pay | Admitting: Orthopaedic Surgery

## 2022-11-22 ENCOUNTER — Other Ambulatory Visit: Payer: Self-pay

## 2022-11-22 ENCOUNTER — Ambulatory Visit (HOSPITAL_BASED_OUTPATIENT_CLINIC_OR_DEPARTMENT_OTHER): Payer: Commercial Managed Care - HMO | Admitting: Anesthesiology

## 2022-11-22 ENCOUNTER — Encounter (HOSPITAL_COMMUNITY)
Admission: RE | Disposition: A | Discharge status: Home / Self Care | Payer: Self-pay | Source: Home / Self Care | Attending: Orthopaedic Surgery

## 2022-11-22 ENCOUNTER — Ambulatory Visit (HOSPITAL_COMMUNITY): Payer: Commercial Managed Care - HMO

## 2022-11-22 ENCOUNTER — Ambulatory Visit (HOSPITAL_COMMUNITY): Payer: Commercial Managed Care - HMO | Admitting: Emergency Medicine

## 2022-11-22 DIAGNOSIS — M84353A Stress fracture, unspecified femur, initial encounter for fracture: Secondary | ICD-10-CM

## 2022-11-22 DIAGNOSIS — F418 Other specified anxiety disorders: Secondary | ICD-10-CM

## 2022-11-22 DIAGNOSIS — M1990 Unspecified osteoarthritis, unspecified site: Secondary | ICD-10-CM | POA: Insufficient documentation

## 2022-11-22 DIAGNOSIS — F319 Bipolar disorder, unspecified: Secondary | ICD-10-CM

## 2022-11-22 DIAGNOSIS — K219 Gastro-esophageal reflux disease without esophagitis: Secondary | ICD-10-CM | POA: Diagnosis not present

## 2022-11-22 DIAGNOSIS — N189 Chronic kidney disease, unspecified: Secondary | ICD-10-CM | POA: Insufficient documentation

## 2022-11-22 DIAGNOSIS — M25551 Pain in right hip: Secondary | ICD-10-CM

## 2022-11-22 DIAGNOSIS — F419 Anxiety disorder, unspecified: Secondary | ICD-10-CM | POA: Insufficient documentation

## 2022-11-22 DIAGNOSIS — M84351A Stress fracture, right femur, initial encounter for fracture: Secondary | ICD-10-CM

## 2022-11-22 DIAGNOSIS — M797 Fibromyalgia: Secondary | ICD-10-CM | POA: Insufficient documentation

## 2022-11-22 DIAGNOSIS — F17210 Nicotine dependence, cigarettes, uncomplicated: Secondary | ICD-10-CM | POA: Insufficient documentation

## 2022-11-22 DIAGNOSIS — M84359A Stress fracture, hip, unspecified, initial encounter for fracture: Secondary | ICD-10-CM | POA: Diagnosis present

## 2022-11-22 HISTORY — PX: HIP PINNING,CANNULATED: SHX1758

## 2022-11-22 SURGERY — FIXATION, FEMUR, NECK, PERCUTANEOUS, USING SCREW
Anesthesia: General | Site: Hip | Laterality: Right

## 2022-11-22 MED ORDER — ACETAMINOPHEN 500 MG PO TABS
1000.0000 mg | ORAL_TABLET | Freq: Once | ORAL | Status: AC
  Administered 2022-11-22: 1000 mg via ORAL
  Filled 2022-11-22: qty 2

## 2022-11-22 MED ORDER — MIDAZOLAM HCL 2 MG/2ML IJ SOLN
INTRAMUSCULAR | Status: AC
  Filled 2022-11-22: qty 2

## 2022-11-22 MED ORDER — FENTANYL CITRATE (PF) 250 MCG/5ML IJ SOLN
INTRAMUSCULAR | Status: AC
  Filled 2022-11-22: qty 5

## 2022-11-22 MED ORDER — CEFAZOLIN SODIUM-DEXTROSE 2-4 GM/100ML-% IV SOLN
2.0000 g | INTRAVENOUS | Status: AC
  Administered 2022-11-22: 2 g via INTRAVENOUS
  Filled 2022-11-22: qty 100

## 2022-11-22 MED ORDER — HYDROCODONE-ACETAMINOPHEN 5-325 MG PO TABS: 1.0000 | ORAL_TABLET | Freq: Four times a day (QID) | ORAL | 0 refills | Status: DC | PRN

## 2022-11-22 MED ORDER — FENTANYL CITRATE (PF) 250 MCG/5ML IJ SOLN
INTRAMUSCULAR | Status: DC | PRN
  Administered 2022-11-22 (×2): 50 ug via INTRAVENOUS

## 2022-11-22 MED ORDER — CHLORHEXIDINE GLUCONATE 0.12 % MT SOLN
15.0000 mL | Freq: Once | OROMUCOSAL | Status: AC
  Administered 2022-11-22: 15 mL via OROMUCOSAL
  Filled 2022-11-22: qty 15

## 2022-11-22 MED ORDER — OXYCODONE HCL 5 MG PO TABS
ORAL_TABLET | ORAL | Status: AC
  Filled 2022-11-22: qty 1

## 2022-11-22 MED ORDER — METOPROLOL TARTRATE 25 MG PO TABS
25.0000 mg | ORAL_TABLET | Freq: Once | ORAL | Status: AC
  Administered 2022-11-22: 25 mg via ORAL
  Filled 2022-11-22: qty 1

## 2022-11-22 MED ORDER — FENTANYL CITRATE (PF) 100 MCG/2ML IJ SOLN
INTRAMUSCULAR | Status: AC
  Filled 2022-11-22: qty 2

## 2022-11-22 MED ORDER — ONDANSETRON HCL 4 MG PO TABS: 4.0000 mg | ORAL_TABLET | Freq: Three times a day (TID) | ORAL | 0 refills | Status: DC | PRN

## 2022-11-22 MED ORDER — DEXAMETHASONE SODIUM PHOSPHATE 10 MG/ML IJ SOLN
INTRAMUSCULAR | Status: DC | PRN
  Administered 2022-11-22: 10 mg via INTRAVENOUS

## 2022-11-22 MED ORDER — TRANEXAMIC ACID-NACL 1000-0.7 MG/100ML-% IV SOLN
1000.0000 mg | INTRAVENOUS | Status: AC
  Administered 2022-11-22: 1000 mg via INTRAVENOUS
  Filled 2022-11-22: qty 100

## 2022-11-22 MED ORDER — LIDOCAINE 2% (20 MG/ML) 5 ML SYRINGE
INTRAMUSCULAR | Status: DC | PRN
  Administered 2022-11-22: 60 mg via INTRAVENOUS

## 2022-11-22 MED ORDER — MIDAZOLAM HCL 2 MG/2ML IJ SOLN
INTRAMUSCULAR | Status: DC | PRN
  Administered 2022-11-22: 2 mg via INTRAVENOUS

## 2022-11-22 MED ORDER — ONDANSETRON HCL 4 MG/2ML IJ SOLN
INTRAMUSCULAR | Status: DC | PRN
  Administered 2022-11-22: 4 mg via INTRAVENOUS

## 2022-11-22 MED ORDER — PHENYLEPHRINE 80 MCG/ML (10ML) SYRINGE FOR IV PUSH (FOR BLOOD PRESSURE SUPPORT)
PREFILLED_SYRINGE | INTRAVENOUS | Status: DC | PRN
  Administered 2022-11-22 (×2): 160 ug via INTRAVENOUS

## 2022-11-22 MED ORDER — 0.9 % SODIUM CHLORIDE (POUR BTL) OPTIME
TOPICAL | Status: DC | PRN
  Administered 2022-11-22: 1000 mL

## 2022-11-22 MED ORDER — METOPROLOL TARTRATE 12.5 MG HALF TABLET
ORAL_TABLET | ORAL | Status: AC
  Filled 2022-11-22: qty 2

## 2022-11-22 MED ORDER — FENTANYL CITRATE (PF) 100 MCG/2ML IJ SOLN
25.0000 ug | INTRAMUSCULAR | Status: DC | PRN
  Administered 2022-11-22 (×2): 25 ug via INTRAVENOUS
  Administered 2022-11-22: 50 ug via INTRAVENOUS
  Administered 2022-11-22 (×2): 25 ug via INTRAVENOUS

## 2022-11-22 MED ORDER — PROPOFOL 10 MG/ML IV BOLUS
INTRAVENOUS | Status: AC
  Filled 2022-11-22: qty 20

## 2022-11-22 MED ORDER — LACTATED RINGERS IV SOLN: INTRAVENOUS | Status: DC

## 2022-11-22 MED ORDER — OXYCODONE HCL 5 MG PO TABS
5.0000 mg | ORAL_TABLET | Freq: Once | ORAL | Status: AC | PRN
  Administered 2022-11-22: 5 mg via ORAL

## 2022-11-22 MED ORDER — ORAL CARE MOUTH RINSE: 15.0000 mL | Freq: Once | OROMUCOSAL | Status: AC

## 2022-11-22 MED ORDER — OXYCODONE HCL 5 MG/5ML PO SOLN: 5.0000 mg | Freq: Once | ORAL | Status: AC | PRN

## 2022-11-22 MED ORDER — PROPOFOL 10 MG/ML IV BOLUS
INTRAVENOUS | Status: DC | PRN
  Administered 2022-11-22: 120 mg via INTRAVENOUS

## 2022-11-22 MED ORDER — PROMETHAZINE HCL 25 MG/ML IJ SOLN: 6.2500 mg | INTRAMUSCULAR | Status: DC | PRN

## 2022-11-22 SURGICAL SUPPLY — 45 items
BAG COUNTER SPONGE SURGICOUNT (BAG) ×1 IMPLANT
BAG SPNG CNTER NS LX DISP (BAG) ×1
BIT DRILL 3.2 QUICK MINI 300 (DRILL) IMPLANT
BIT DRILL 5.0 QC 6.5 (BIT) IMPLANT
BNDG COHESIVE 4X5 TAN STRL (GAUZE/BANDAGES/DRESSINGS) ×1 IMPLANT
BNDG GAUZE DERMACEA FLUFF 4 (GAUZE/BANDAGES/DRESSINGS) ×1 IMPLANT
BNDG GZE DERMACEA 4 6PLY (GAUZE/BANDAGES/DRESSINGS) ×1
COVER PERINEAL POST (MISCELLANEOUS) ×1 IMPLANT
DRAPE STERI IOBAN 125X83 (DRAPES) ×1 IMPLANT
DRESSING MEPILEX FLEX 4X4 (GAUZE/BANDAGES/DRESSINGS) ×1 IMPLANT
DRSG ADAPTIC 3X8 NADH LF (GAUZE/BANDAGES/DRESSINGS) ×1 IMPLANT
DRSG AQUACEL AG ADV 3.5X 4 (GAUZE/BANDAGES/DRESSINGS) IMPLANT
DRSG MEPILEX FLEX 4X4 (GAUZE/BANDAGES/DRESSINGS) ×1
DURAPREP 26ML APPLICATOR (WOUND CARE) ×1 IMPLANT
ELECT CAUTERY BLADE 6.4 (BLADE) ×1 IMPLANT
ELECT REM PT RETURN 9FT ADLT (ELECTROSURGICAL) ×1
ELECTRODE REM PT RTRN 9FT ADLT (ELECTROSURGICAL) ×1 IMPLANT
GLOVE BIOGEL PI IND STRL 7.0 (GLOVE) ×2 IMPLANT
GLOVE BIOGEL PI IND STRL 7.5 (GLOVE) ×1 IMPLANT
GLOVE ECLIPSE 7.0 STRL STRAW (GLOVE) ×1 IMPLANT
GLOVE SKINSENSE STRL SZ7.5 (GLOVE) ×2 IMPLANT
GLOVE SURG SYN 7.5  E (GLOVE) ×2
GLOVE SURG SYN 7.5 E (GLOVE) ×2 IMPLANT
GLOVE SURG SYN 7.5 PF PI (GLOVE) ×2 IMPLANT
GLOVE SURG UNDER POLY LF SZ7 (GLOVE) ×19 IMPLANT
GLOVE SURG UNDER POLY LF SZ7.5 (GLOVE) ×4 IMPLANT
GOWN STRL SURGICAL XL XLNG (GOWN DISPOSABLE) ×3 IMPLANT
KIT BASIN OR (CUSTOM PROCEDURE TRAY) ×1 IMPLANT
KIT TURNOVER KIT B (KITS) ×1 IMPLANT
MANIFOLD NEPTUNE II (INSTRUMENTS) ×1 IMPLANT
NS IRRIG 1000ML POUR BTL (IV SOLUTION) ×1 IMPLANT
PACK GENERAL/GYN (CUSTOM PROCEDURE TRAY) ×1 IMPLANT
PAD ARMBOARD 7.5X6 YLW CONV (MISCELLANEOUS) ×2 IMPLANT
SCREW CANN RVRS CUT FLT 90X16X (Screw) IMPLANT
SCREW CANNULATED 6.5X90 (Screw) ×1 IMPLANT
SCREW THREADED 6.5X85 (Screw) IMPLANT
STAPLER VISISTAT 35W (STAPLE) IMPLANT
SUT ETHILON 4 0 P 3 18 (SUTURE) IMPLANT
SUT VIC AB 0 CT1 27 (SUTURE) ×2
SUT VIC AB 0 CT1 27XBRD ANBCTR (SUTURE) ×1 IMPLANT
SUT VIC AB 2-0 CT1 27 (SUTURE) ×2
SUT VIC AB 2-0 CT1 TAPERPNT 27 (SUTURE) ×1 IMPLANT
TOWEL GREEN STERILE (TOWEL DISPOSABLE) ×1 IMPLANT
TOWEL GREEN STERILE FF (TOWEL DISPOSABLE) ×1 IMPLANT
WATER STERILE IRR 1000ML POUR (IV SOLUTION) ×1 IMPLANT

## 2022-11-22 NOTE — Discharge Instructions (Signed)
   Postoperative instructions:  Weightbearing instructions: as tolerated  Keep your dressing and/or splint clean and dry at all times.  You can remove your dressing on post-operative day #3 and change with a dry/sterile dressing or Band-Aids as needed thereafter.    Incision instructions:  Do not soak your incision for 3 weeks after surgery.  If the incision gets wet, pat dry and do not scrub the incision.  Pain control:  You have been given a prescription to be taken as directed for post-operative pain control.  In addition, elevate the operative extremity above the heart at all times to prevent swelling and throbbing pain.  Take over-the-counter Colace,  by mouth twice a day while taking narcotic pain medications to help prevent constipation.  Follow up appointments: 1) 14 days for suture removal and wound check with Shannon Lerner, PA   -------------------------------------------------------------------------------------------------------------  After Surgery Pain Control:  After your surgery, post-surgical discomfort or pain is likely. This discomfort can last several days to a few weeks. At certain times of the day your discomfort may be more intense.  Did you receive a nerve block?  A nerve block can provide pain relief for one hour to two days after your surgery. As long as the nerve block is working, you will experience little or no sensation in the area the surgeon operated on.  As the nerve block wears off, you will begin to experience pain or discomfort. It is very important that you begin taking your prescribed pain medication before the nerve block fully wears off. Treating your pain at the first sign of the block wearing off will ensure your pain is better controlled and more tolerable when full-sensation returns. Do not wait until the pain is intolerable, as the medicine will be less effective. It is better to treat pain in advance than to try and catch up.  General  Anesthesia:  If you did not receive a nerve block during your surgery, you will need to start taking your pain medication shortly after your surgery and should continue to do so as prescribed by your surgeon.  Pain Medication:  Most commonly we prescribe Vicodin and Percocet for post-operative pain. Both of these medications contain a combination of acetaminophen (Tylenol) and a narcotic to help control pain.   It takes between 30 and 45 minutes before pain medication starts to work. It is important to take your medication before your pain level gets too intense.   Nausea is a common side effect of many pain medications. You will want to eat something before taking your pain medicine to help prevent nausea.   If you are taking a prescription pain medication that contains acetaminophen, we recommend that you do not take additional over the counter acetaminophen (Tylenol).  Other pain relieving options:   Using a cold pack to ice the affected area a few times a day (15 to 20 minutes at a time) can help to relieve pain, reduce swelling and bruising.   Elevation of the affected area can also help to reduce pain and swelling.

## 2022-11-22 NOTE — Transfer of Care (Signed)
Immediate Anesthesia Transfer of Care Note  Patient: Shannon Spears  Procedure(s) Performed: RIGHT HIP PINNING (Right: Hip)  Patient Location: PACU  Anesthesia Type:General  Level of Consciousness: awake  Airway & Oxygen Therapy: Patient Spontanous Breathing and Patient connected to face mask oxygen  Post-op Assessment: Report given to RN and Post -op Vital signs reviewed and stable  Post vital signs: Reviewed and stable  Last Vitals:  Vitals Value Taken Time  BP 107/53 11/22/22 1342  Temp    Pulse 85 11/22/22 1343  Resp 18 11/22/22 1343  SpO2 96 % 11/22/22 1343  Vitals shown include unvalidated device data.  Last Pain:  Vitals:   11/22/22 1055  TempSrc: Oral  PainSc: 5          Complications: No notable events documented.

## 2022-11-22 NOTE — Anesthesia Postprocedure Evaluation (Signed)
Anesthesia Post Note  Patient: Shannon Spears  Procedure(s) Performed: RIGHT HIP PINNING (Right: Hip)     Patient location during evaluation: PACU Anesthesia Type: General Level of consciousness: awake and alert Pain management: pain level controlled Vital Signs Assessment: post-procedure vital signs reviewed and stable Respiratory status: spontaneous breathing, nonlabored ventilation and respiratory function stable Cardiovascular status: stable and blood pressure returned to baseline Anesthetic complications: no   No notable events documented.  Last Vitals:  Vitals:   11/22/22 1500 11/22/22 1515  BP: 122/82 126/84  Pulse: 79 76  Resp: 17 12  Temp:    SpO2: 97% 98%             Beryle Lathe

## 2022-11-22 NOTE — Anesthesia Procedure Notes (Signed)
Procedure Name: LMA Insertion Date/Time: 11/22/2022 12:43 PM  Performed by: Caren Macadam, CRNAPre-anesthesia Checklist: Patient identified, Emergency Drugs available, Suction available and Patient being monitored Patient Re-evaluated:Patient Re-evaluated prior to induction Oxygen Delivery Method: Circle system utilized Preoxygenation: Pre-oxygenation with 100% oxygen Induction Type: IV induction Ventilation: Mask ventilation without difficulty LMA: LMA inserted LMA Size: 4.0 Number of attempts: 1 Placement Confirmation: positive ETCO2 and breath sounds checked- equal and bilateral Tube secured with: Tape Dental Injury: Teeth and Oropharynx as per pre-operative assessment

## 2022-11-22 NOTE — Op Note (Addendum)
   Date of Surgery: 11/22/2022  INDICATIONS: Shannon Spears is a 42 y.o.-year-old female who sustained a hip fracture; she was indicated for open reduction and internal fixation due to the displaced nature of the fracture and came to the operating room today for this procedure. The patient did consent to the procedure after discussion of the risks and benefits.   PREOPERATIVE DIAGNOSIS: right femoral neck stress fracture  POSTOPERATIVE DIAGNOSIS: Same.  PROCEDURE: Cannulated hip pinning of right femoral neck stress fracture  SURGEON: N. Glee Arvin, M.D.  ASSIST: None  ANESTHESIA: general  IV FLUIDS AND URINE: See anesthesia.  ESTIMATED BLOOD LOSS: minimal mL.  IMPLANTS: [employer]hew 6.5 mm cannulated screws, 90 mm (inferior), 85 mm (anterior), 85 mm (posterior)  DRAINS: None.  COMPLICATIONS: see description of procedure.  DESCRIPTION OF PROCEDURE: The patient was brought to the operating room and placed supine on the operating table.  The patient had been signed prior to the procedure and this was documented. The patient had the anesthesia placed by the anesthesiologist.  The prep verification and incision time-outs were performed to confirm that this was the correct patient, site, side and location. The patient had SCDs in place. The patient did receive antibiotics prior to the incision and was redosed during the procedure as needed at indicated intervals.  The patient's well leg was placed in an extended position and carefully padded and strapped to the .  The operative leg was placed in traction and also well-padded.  Care was taken to confirm radiographs before prepping and draping. The hip was prepped and draped in the standard fashion.  A 1 inch incision was made on the lateral thigh.  Screws were placed using the following technique.  The inferior guide pin was first placed and confirmed to be in the proper location on both AP and lateral views. I then placed the anterior and posterior  guide pins using the same technique.  The cannulated depth gauge was used for measurement.  A cannulated drill was then advanced over each guide pin to the appropriate depths.   The screws were then placed over the guide pins first by power and then finished by hand.  The approach-withdraw phenomenon was visualized under live fluoroscopy to confirm that all screws were of the appropriate length and in the head.   The wound was copiously irrigated with saline and then layered closure was performed. The wounds were cleaned and dried a final time and a sterile dressing. The patient was then transferred back to the bed and left the operating room in stable condition.  All sponge and instrument counts were correct.  POSTOPERATIVE PLAN: Shannon Spears will be weight bearing as tolerated; she will return for first postop visit at 2 weeks.

## 2022-11-22 NOTE — H&P (Signed)
PREOPERATIVE H&P  Chief Complaint: right femoral neck stress fracture  HPI: Shannon Spears is a 42 y.o. female who presents for surgical treatment of right femoral neck stress fracture.  She denies any changes in medical history.  Past Medical History:  Diagnosis Date   Anxiety    Arthritis    Bipolar 1 disorder, manic, mild    CKD (chronic kidney disease)    Depression    Fibromyalgia    GERD (gastroesophageal reflux disease)    Headache(784.0)    Palpitations    Past Surgical History:  Procedure Laterality Date   ANTERIOR CERVICAL DECOMP/DISCECTOMY FUSION N/A 10/02/2012   Procedure: C4-5, C5-6 anterior cervical discectomy and fusion, allograft plate;  Surgeon: Eldred Manges, MD;  Location: [facility] OR;  Service: Orthopedics;  Laterality: N/A;   CHOLECYSTECTOMY     CYSTOGRAPHY     LAPAROSCOPIC TOTAL HYSTERECTOMY     OOPHORECTOMY     TUBAL LIGATION     Social History   Socioeconomic History   Marital status: Married    Spouse name: Not on file   Number of children: Not on file   Years of education: Not on file   Highest education level: Not on file  Occupational History   Not on file  Tobacco Use   Smoking status: Every Day    Packs/day: .5    Types: Cigarettes   Smokeless tobacco: Never  Vaping Use   Vaping Use: Never used  Substance and Sexual Activity   Alcohol use: Yes    Comment: rarely   Drug use: Yes    Types: Marijuana    Comment: occasionally   Sexual activity: Not on file  Other Topics Concern   Not on file  Social History Narrative   Not on file   Social Determinants of Health   Financial Resource Strain: Not on file  Food Insecurity: Not on file  Transportation Needs: Not on file  Physical Activity: Not on file  Stress: Not on file  Social Connections: Not on file   Family History  Problem Relation Age of Onset   Bipolar disorder Mother    Diabetes Mother    Hypertension Mother    Hypertension Father    Colon cancer Paternal Grandmother     Allergies  Allergen Reactions   Buprenorphine Shortness Of Breath and Rash    Breathing problems   Penicillins Anaphylaxis, Nausea And Vomiting and Swelling    TOLERATES ANCEF   Dermatitis Antigen     Other reaction(s): Dermatitis  Tape   Gabapentin Nausea And Vomiting   Nsaids     Renal insufficiency   Propoxyphene Nausea Only    darvocet   Tetanus Toxoids     Blister on legs   Adhesive [Tape] Dermatitis   Codeine     Mouth went numb   Ibuprofen     Has kidney disease.   Doxycycline Rash   Prior to Admission medications   Medication Sig Start Date End Date Taking? Authorizing Provider  CALCIUM-VITAMIN D PO Take 1 tablet by mouth daily.   Yes [provider]  cetirizine (ZYRTEC) 10 MG tablet Take 10 mg by mouth daily.   Yes [provider]  cyclobenzaprine (FLEXERIL) 10 MG tablet Take 1 tablet (10 mg total) by mouth 3 (three) times daily as needed for muscle spasms. 06/21/19  Yes Hoy Register, MD  diazepam (VALIUM) 10 MG tablet Take 1 tablet (10 mg total) by mouth 3 (three) times daily as needed for  anxiety. 09/19/22  Yes Mozingo, Thereasa Solo, NP  famotidine (PEPCID AC MAXIMUM STRENGTH) 20 MG tablet Take 20 mg by mouth daily.   Yes [provider]  hydrOXYzine (ATARAX) 25 MG tablet TAKE 1 TABLET BY MOUTH UP TO 6 TIMES DAILY AS NEEDED FOR ANXIETY, ITCHING, AND INSOMNIA. 09/19/22  Yes Mozingo, Thereasa Solo, NP  Lactobacillus (PROBIOTIC ACIDOPHILUS PO) Take 1 capsule by mouth daily.   Yes [provider]  lamoTRIgine (LAMICTAL) 200 MG tablet Take 1 tablet (200 mg total) by mouth at bedtime. 09/19/22  Yes Mozingo, Thereasa Solo, NP  levETIRAcetam (KEPPRA) 500 MG tablet Take 1,000 mg by mouth 2 (two) times daily.   Yes [provider]  lisinopril (ZESTRIL) 2.5 MG tablet Take 2.5 mg by mouth at bedtime.   Yes [provider]  metoprolol tartrate (LOPRESSOR) 25 MG tablet Take 25 mg by mouth 2 (two) times daily.   Yes  [provider]  montelukast (SINGULAIR) 10 MG tablet Take 10 mg by mouth at bedtime.   Yes [provider]  albuterol (PROVENTIL) (2.5 MG/3ML) 0.083% nebulizer solution Take 3 mLs (2.5 mg total) by nebulization every 6 (six) hours as needed for wheezing or shortness of breath. 02/01/19   Bing Neighbors, NP  albuterol (VENTOLIN HFA) 108 (90 Base) MCG/ACT inhaler Inhale 2 puffs into the lungs every 6 (six) hours as needed for wheezing or shortness of breath. 02/01/19   Bing Neighbors, NP  cefUROXime (CEFTIN) 500 MG tablet Take 1 tablet (500 mg total) by mouth 2 (two) times daily with a meal. Patient not taking: Reported on 11/13/2022 08/12/19   Tilden Fossa, MD  ondansetron (ZOFRAN ODT) 4 MG disintegrating tablet Take 1 tablet (4 mg total) by mouth every 8 (eight) hours as needed for nausea or vomiting. Patient not taking: Reported on 11/13/2022 08/12/19   Tilden Fossa, MD     Positive ROS: All other systems have been reviewed and were otherwise negative with the exception of those mentioned in the HPI and as above.  Physical Exam: General: Alert, no acute distress Cardiovascular: No pedal edema Respiratory: No cyanosis, no use of accessory musculature GI: abdomen soft Skin: No lesions in the area of chief complaint Neurologic: Sensation intact distally Psychiatric: Patient is competent for consent with normal mood and affect Lymphatic: no lymphedema  MUSCULOSKELETAL: exam stable  Assessment: right femoral neck stress fracture  Plan: Plan for Procedure(s): RIGHT HIP PINNING  The risks benefits and alternatives were discussed with the patient including but not limited to the risks of nonoperative treatment, versus surgical intervention including infection, bleeding, nerve injury,  blood clots, cardiopulmonary complications, morbidity, mortality, among others, and they were willing to proceed.   Glee Arvin, MD 11/22/2022 11:02 AM

## 2022-11-24 ENCOUNTER — Encounter: Payer: Self-pay | Admitting: Orthopaedic Surgery

## 2022-11-25 ENCOUNTER — Encounter (HOSPITAL_COMMUNITY): Payer: Self-pay | Admitting: Orthopaedic Surgery

## 2022-11-25 ENCOUNTER — Telehealth: Payer: Self-pay | Admitting: Orthopaedic Surgery

## 2022-11-25 NOTE — Telephone Encounter (Signed)
Called and LMOM. Advised patient to leave bandage in place and keep it dry until her post op visit.

## 2022-11-25 NOTE — Telephone Encounter (Signed)
Patient called. Would like to know if she can take bandage off and take a shower? Her call back number is [phone]

## 2022-12-02 ENCOUNTER — Encounter: Payer: Self-pay | Admitting: Orthopaedic Surgery

## 2022-12-05 ENCOUNTER — Other Ambulatory Visit (INDEPENDENT_AMBULATORY_CARE_PROVIDER_SITE_OTHER): Payer: Commercial Managed Care - HMO

## 2022-12-05 ENCOUNTER — Ambulatory Visit (INDEPENDENT_AMBULATORY_CARE_PROVIDER_SITE_OTHER): Payer: Commercial Managed Care - HMO | Admitting: Physician Assistant

## 2022-12-05 DIAGNOSIS — M84351A Stress fracture, right femur, initial encounter for fracture: Secondary | ICD-10-CM | POA: Diagnosis not present

## 2022-12-05 DIAGNOSIS — S93491A Sprain of other ligament of right ankle, initial encounter: Secondary | ICD-10-CM

## 2022-12-05 MED ORDER — HYDROCODONE-ACETAMINOPHEN 5-325 MG PO TABS: 1.0000 | ORAL_TABLET | Freq: Three times a day (TID) | ORAL | 0 refills | Status: DC | PRN

## 2022-12-05 NOTE — Progress Notes (Signed)
Post-Op Visit Note   Patient: Shannon Spears           Date of Birth: May 30, 1981           MRN: [id number] Visit Date: 12/05/2022 PCP: Malka So., MD   Assessment & Plan:  Chief Complaint:  Chief Complaint  Patient presents with   Right Hip - Routine Post Op   Visit Diagnoses:  1. Stress fracture of neck of right femur   2. Sprain of anterior talofibular ligament of right ankle, initial encounter     Plan: Patient is a pleasant 42 year old female who comes in today approximately 2 weeks status post cannulated pinning right femoral neck stress fracture, date of surgery 11/22/2022.  She has been doing okay but has been in a moderate amount of pain.  She is taking Norco on occasion for this.  She is ambulating with a single-point crutch.  Examination of the right hip reveals a well-healed surgical incision without complication.  Calves are soft and nontender.  She is neurovascular intact distally.  Today, the incision was covered with Steri-Strips.  We have discussed starting her in outpatient physical therapy but she thinks she will be able to rehab this on her own.  She will let me know if she changes her mind.  She will continue to advance with activity as tolerated.  I have refilled her Norco.  She will follow-up with [place] in 4 weeks for repeat evaluation and x-rays of the pelvis.  Call with concerns or questions in the meantime.  Follow-Up Instructions: Return in about 4 weeks (around 01/02/2023).   Orders:  Orders Placed This Encounter  Procedures   XR HIP UNILAT W OR W/O PELVIS 2-3 VIEWS RIGHT   Meds ordered this encounter  Medications   HYDROcodone-acetaminophen (NORCO) 5-325 MG tablet    Sig: Take 1 tablet by mouth 3 (three) times daily as needed.    Dispense:  30 tablet    Refill:  0    Imaging: XR HIP UNILAT W OR W/O PELVIS 2-3 VIEWS RIGHT  Result Date: 12/05/2022 X-rays demonstrate cannulated hip pinning without hardware complication   PMFS History: Patient Active  Problem List   Diagnosis Date Noted   Fracture, stress, femur, neck, initial encounter 11/22/2022   Knee contusion 04/06/2019   Bipolar I disorder, current or most recent episode depressed, in partial remission with mixed features (HCC) 06/16/2018   Chronic post-traumatic stress disorder 06/16/2018   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   Opioid use disorder, mild, in sustained remission (HCC) 06/16/2018   S/P cholecystectomy 02/10/2018   S/P cervical spinal fusion 12/08/2017   Protrusion of cervical intervertebral disc 12/08/2017   HNP (herniated nucleus pulposus), cervical 10/02/2012    Class: Diagnosis of   Past Medical History:  Diagnosis Date   Anxiety    Arthritis    Bipolar 1 disorder, manic, mild (HCC)    CKD (chronic kidney disease)    Depression    Fibromyalgia    GERD (gastroesophageal reflux disease)    Headache(784.0)    Palpitations     Family History  Problem Relation Age of Onset   Bipolar disorder Mother    Diabetes Mother    Hypertension Mother    Hypertension Father    Colon cancer Paternal Grandmother     Past Surgical History:  Procedure Laterality Date   ANTERIOR CERVICAL DECOMP/DISCECTOMY FUSION N/A 10/02/2012   Procedure: C4-5, C5-6 anterior cervical discectomy and fusion, allograft plate;  Surgeon: Eldred Manges, MD;  Location: [facility] OR;  Service: Orthopedics;  Laterality: N/A;   CHOLECYSTECTOMY     CYSTOGRAPHY     HIP PINNING,CANNULATED Right 11/22/2022   Procedure: RIGHT HIP PINNING;  Surgeon: Tarry Kos, MD;  Location: [facility] OR;  Service: Orthopedics;  Laterality: Right;   LAPAROSCOPIC TOTAL HYSTERECTOMY     OOPHORECTOMY     TUBAL LIGATION     Social History   Occupational History   Not on file  Tobacco Use   Smoking status: Every Day    Packs/day: .5    Types: Cigarettes   Smokeless tobacco: Never  Vaping Use   Vaping Use: Never used  Substance and Sexual Activity   Alcohol use: Yes    Comment: rarely   Drug use: Yes    Types:  Marijuana    Comment: occasionally   Sexual activity: Not on file

## 2022-12-13 ENCOUNTER — Other Ambulatory Visit: Payer: Self-pay | Admitting: Physician Assistant

## 2022-12-13 ENCOUNTER — Encounter: Payer: Self-pay | Admitting: Orthopaedic Surgery

## 2022-12-13 MED ORDER — HYDROCODONE-ACETAMINOPHEN 5-325 MG PO TABS: 1.0000 | ORAL_TABLET | Freq: Three times a day (TID) | ORAL | 0 refills | Status: DC | PRN

## 2022-12-13 MED ORDER — METHOCARBAMOL 750 MG PO TABS: 750.0000 mg | ORAL_TABLET | Freq: Two times a day (BID) | ORAL | 2 refills | Status: DC | PRN

## 2022-12-13 NOTE — Telephone Encounter (Signed)
I have refilled her norco and sent in robaxin.  I would add ibuprofen as well

## 2022-12-18 ENCOUNTER — Telehealth (INDEPENDENT_AMBULATORY_CARE_PROVIDER_SITE_OTHER): Payer: Commercial Managed Care - HMO | Admitting: Adult Health

## 2022-12-18 ENCOUNTER — Encounter: Payer: Self-pay | Admitting: Adult Health

## 2022-12-18 DIAGNOSIS — F451 Undifferentiated somatoform disorder: Secondary | ICD-10-CM

## 2022-12-18 DIAGNOSIS — F4312 Post-traumatic stress disorder, chronic: Secondary | ICD-10-CM | POA: Diagnosis not present

## 2022-12-18 DIAGNOSIS — F3175 Bipolar disorder, in partial remission, most recent episode depressed: Secondary | ICD-10-CM | POA: Diagnosis not present

## 2022-12-18 DIAGNOSIS — F411 Generalized anxiety disorder: Secondary | ICD-10-CM
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
## 2022-12-22 ENCOUNTER — Encounter: Payer: Self-pay | Admitting: Orthopaedic Surgery

## 2022-12-23 ENCOUNTER — Telehealth: Payer: Self-pay | Admitting: Orthopaedic Surgery

## 2022-12-23 NOTE — Telephone Encounter (Signed)
Already added Pt to [facility] schedule for tomorrow no need to call Per Imagene Riches they had on mychart

## 2022-12-24 ENCOUNTER — Ambulatory Visit (INDEPENDENT_AMBULATORY_CARE_PROVIDER_SITE_OTHER): Payer: Commercial Managed Care - HMO | Admitting: Physician Assistant

## 2022-12-24 ENCOUNTER — Other Ambulatory Visit (INDEPENDENT_AMBULATORY_CARE_PROVIDER_SITE_OTHER): Payer: Commercial Managed Care - HMO

## 2022-12-24 DIAGNOSIS — M84351A Stress fracture, right femur, initial encounter for fracture: Secondary | ICD-10-CM

## 2022-12-24 DIAGNOSIS — S93491A Sprain of other ligament of right ankle, initial encounter: Secondary | ICD-10-CM

## 2022-12-24 MED ORDER — HYDROCODONE-ACETAMINOPHEN 5-325 MG PO TABS: 1.0000 | ORAL_TABLET | Freq: Two times a day (BID) | ORAL | 0 refills | Status: DC | PRN

## 2022-12-24 NOTE — Telephone Encounter (Signed)
Please have her added to [facility]'s schedule for today.  Thanks.

## 2022-12-24 NOTE — Progress Notes (Signed)
Post-Op Visit Note   Patient: Shannon Spears           Date of Birth: 04/13/1981           MRN: [id number] Visit Date: 12/24/2022 PCP: Malka So., MD   Assessment & Plan:  Chief Complaint:  Chief Complaint  Patient presents with   Right Hip - Routine Post Op   Visit Diagnoses:  1. Stress fracture of neck of right femur   2. Sprain of anterior talofibular ligament of right ankle, initial encounter     Plan: Patient is a 42 year old female who comes in today approximately 4 weeks status post right hip cannulated pinning for a femoral neck stress fracture, date of surgery 11/22/2022.  She continues to have pain to the right groin and lateral hip but has only mildly improved.  She is taking occasional Norco but has run out.  She has been working on a home exercise program.  Examination of the right hip reveals a fully healed surgical scar without complication.  Painless hip flexion and logroll.  She is neurovascularly intact distally.  At this point, I believe she is having normal postsurgical pain that will continue to improve over time.  X-rays are unremarkable.  She will continue with her home exercise program.  I refilled her Norco.  Follow-up with [place] in 4 weeks for repeat evaluation and AP pelvis right hip x-rays.  Call with concerns or questions in the meantime.  Follow-Up Instructions: Return in about 6 weeks (around 02/04/2023).   Orders:  Orders Placed This Encounter  Procedures   XR HIP UNILAT W OR W/O PELVIS 2-3 VIEWS RIGHT   Meds ordered this encounter  Medications   HYDROcodone-acetaminophen (NORCO) 5-325 MG tablet    Sig: Take 1 tablet by mouth 2 (two) times daily as needed.    Dispense:  30 tablet    Refill:  0    Imaging: No results found.  PMFS History: Patient Active Problem List   Diagnosis Date Noted   Fracture, stress, femur, neck, initial encounter 11/22/2022   Knee contusion 04/06/2019   Bipolar I disorder, current or most recent episode depressed, in  partial remission with mixed features (HCC) 06/16/2018   Chronic post-traumatic stress disorder 06/16/2018   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   Opioid use disorder, mild, in sustained remission (HCC) 06/16/2018   S/P cholecystectomy 02/10/2018   S/P cervical spinal fusion 12/08/2017   Protrusion of cervical intervertebral disc 12/08/2017   HNP (herniated nucleus pulposus), cervical 10/02/2012    Class: Diagnosis of   Past Medical History:  Diagnosis Date   Anxiety    Arthritis    Bipolar 1 disorder, manic, mild (HCC)    CKD (chronic kidney disease)    Depression    Fibromyalgia    GERD (gastroesophageal reflux disease)    Headache(784.0)    Palpitations     Family History  Problem Relation Age of Onset   Bipolar disorder Mother    Diabetes Mother    Hypertension Mother    Hypertension Father    Colon cancer Paternal Grandmother     Past Surgical History:  Procedure Laterality Date   ANTERIOR CERVICAL DECOMP/DISCECTOMY FUSION N/A 10/02/2012   Procedure: C4-5, C5-6 anterior cervical discectomy and fusion, allograft plate;  Surgeon: Eldred Manges, MD;  Location: [facility] OR;  Service: Orthopedics;  Laterality: N/A;   CHOLECYSTECTOMY     CYSTOGRAPHY     HIP PINNING,CANNULATED Right 11/22/2022   Procedure: RIGHT HIP PINNING;  Surgeon: Tarry Kos, MD;  Location: [facility] OR;  Service: Orthopedics;  Laterality: Right;   LAPAROSCOPIC TOTAL HYSTERECTOMY     OOPHORECTOMY     TUBAL LIGATION     Social History   Occupational History   Not on file  Tobacco Use   Smoking status: Every Day    Packs/day: .5    Types: Cigarettes   Smokeless tobacco: Never  Vaping Use   Vaping Use: Never used  Substance and Sexual Activity   Alcohol use: Yes    Comment: rarely   Drug use: Yes    Types: Marijuana    Comment: occasionally   Sexual activity: Not on file

## 2022-12-30 ENCOUNTER — Encounter: Payer: Self-pay | Admitting: Orthopaedic Surgery

## 2022-12-31 ENCOUNTER — Other Ambulatory Visit: Payer: Self-pay | Admitting: Orthopaedic Surgery

## 2022-12-31 ENCOUNTER — Encounter: Payer: Commercial Managed Care - HMO | Admitting: Physician Assistant

## 2022-12-31 ENCOUNTER — Encounter: Payer: Self-pay | Admitting: Orthopaedic Surgery

## 2022-12-31 MED ORDER — TRAMADOL HCL 50 MG PO TABS: 50.0000 mg | ORAL_TABLET | Freq: Every day | ORAL | 0 refills | Status: DC | PRN

## 2023-01-03 ENCOUNTER — Emergency Department (HOSPITAL_COMMUNITY): Payer: Commercial Managed Care - HMO

## 2023-01-03 ENCOUNTER — Encounter (HOSPITAL_COMMUNITY): Payer: Self-pay

## 2023-01-03 ENCOUNTER — Ambulatory Visit
Admission: RE | Admit: 2023-01-03 | Discharge: 2023-01-03 | Disposition: A | Discharge status: Home / Self Care | Payer: Commercial Managed Care - HMO | Source: Ambulatory Visit | Attending: Internal Medicine | Admitting: Internal Medicine

## 2023-01-03 ENCOUNTER — Emergency Department (HOSPITAL_COMMUNITY)
Admission: EM | Admit: 2023-01-03 | Discharge: 2023-01-03 | Disposition: A | Discharge status: Home / Self Care | Payer: Commercial Managed Care - HMO | Attending: Emergency Medicine | Admitting: Emergency Medicine

## 2023-01-03 VITALS — BP 102/71 | HR 130 | Temp 98.1°F | Resp 16 | Ht 61.0 in | Wt 160.0 lb

## 2023-01-03 DIAGNOSIS — R197 Diarrhea, unspecified: Secondary | ICD-10-CM

## 2023-01-03 DIAGNOSIS — D751 Secondary polycythemia: Secondary | ICD-10-CM | POA: Diagnosis not present

## 2023-01-03 DIAGNOSIS — D72829 Elevated white blood cell count, unspecified: Secondary | ICD-10-CM | POA: Diagnosis not present

## 2023-01-03 DIAGNOSIS — D75839 Thrombocytosis, unspecified: Secondary | ICD-10-CM | POA: Insufficient documentation

## 2023-01-03 DIAGNOSIS — R112 Nausea with vomiting, unspecified: Secondary | ICD-10-CM

## 2023-01-03 DIAGNOSIS — R000 Tachycardia, unspecified: Secondary | ICD-10-CM | POA: Diagnosis not present

## 2023-01-03 LAB — CBC
HCT: 43.5 % (ref 36.0–46.0)
Hemoglobin: 14.2 g/dL (ref 12.0–15.0)
MCH: 27.6 pg (ref 26.0–34.0)
MCHC: 32.6 g/dL (ref 30.0–36.0)
MCV: 84.6 fL (ref 80.0–100.0)
Platelets: 453 10*3/uL — ABNORMAL HIGH (ref 150–400)
RBC: 5.14 MIL/uL — ABNORMAL HIGH (ref 3.87–5.11)
RDW: 13.2 % (ref 11.5–15.5)
WBC: 14.8 10*3/uL — ABNORMAL HIGH (ref 4.0–10.5)
nRBC: 0 % (ref 0.0–0.2)

## 2023-01-03 LAB — LIPASE, BLOOD: Lipase: 31 U/L (ref 11–51)

## 2023-01-03 LAB — URINALYSIS, ROUTINE W REFLEX MICROSCOPIC
Bilirubin Urine: NEGATIVE
Glucose, UA: NEGATIVE mg/dL
Ketones, ur: NEGATIVE mg/dL
Leukocytes,Ua: NEGATIVE
Nitrite: NEGATIVE
Protein, ur: 100 mg/dL — AB
RBC / HPF: >50 RBC/hpf (ref 0–5)
Specific Gravity, Urine: 1.014 (ref 1.005–1.030)
pH: 5 (ref 5.0–8.0)

## 2023-01-03 LAB — COMPREHENSIVE METABOLIC PANEL
ALT: 31 U/L (ref 0–44)
AST: 24 U/L (ref 15–41)
Albumin: 3.5 g/dL (ref 3.5–5.0)
Alkaline Phosphatase: 171 U/L — ABNORMAL HIGH (ref 38–126)
Anion gap: 10 (ref 5–15)
BUN: 6 mg/dL (ref 6–20)
CO2: 24 mmol/L (ref 22–32)
Calcium: 8.9 mg/dL (ref 8.9–10.3)
Chloride: 100 mmol/L (ref 98–111)
Creatinine, Ser: 0.85 mg/dL (ref 0.44–1.00)
GFR, Estimated: >60 mL/min (ref >60)
Glucose, Bld: 116 mg/dL — ABNORMAL HIGH (ref 70–99)
Potassium: 3.9 mmol/L (ref 3.5–5.1)
Sodium: 134 mmol/L — ABNORMAL LOW (ref 135–145)
Total Bilirubin: 0.5 mg/dL (ref 0.3–1.2)
Total Protein: 7.8 g/dL (ref 6.5–8.1)

## 2023-01-03 LAB — LACTIC ACID, PLASMA: Lactic Acid, Venous: 1.2 mmol/L (ref 0.5–1.9)

## 2023-01-03 MED ORDER — DICYCLOMINE HCL 20 MG PO TABS: 20.0000 mg | ORAL_TABLET | Freq: Two times a day (BID) | ORAL | 0 refills | Status: DC

## 2023-01-03 MED ORDER — ONDANSETRON HCL 4 MG/2ML IJ SOLN
4.0000 mg | Freq: Once | INTRAMUSCULAR | Status: AC
  Administered 2023-01-03: 4 mg via INTRAVENOUS
  Filled 2023-01-03: qty 2

## 2023-01-03 MED ORDER — DICYCLOMINE HCL 10 MG PO CAPS
20.0000 mg | ORAL_CAPSULE | Freq: Once | ORAL | Status: AC
  Administered 2023-01-03: 20 mg via ORAL
  Filled 2023-01-03: qty 2

## 2023-01-03 MED ORDER — ONDANSETRON 4 MG PO TBDP: 4.0000 mg | ORAL_TABLET | Freq: Three times a day (TID) | ORAL | 0 refills | Status: DC | PRN

## 2023-01-03 MED ORDER — SODIUM CHLORIDE 0.9 % IV BOLUS
1000.0000 mL | Freq: Once | INTRAVENOUS | Status: AC
  Administered 2023-01-03: 1000 mL via INTRAVENOUS

## 2023-01-03 NOTE — Discharge Instructions (Addendum)
Bland foods such as bananas rice applesauce toast rice cakes can be helpful with constipation.  Zofran for nausea as needed.  Drink plenty of water, fluticasone to squirts is in each nostril twice daily

## 2023-01-03 NOTE — ED Triage Notes (Signed)
Patient c/o nausea, vomiting and diarrhea x 3 days.  Everytime she eats, she has diarrhea.  Fever comes and goes, headache.  Patient denies taken any OTC meds.

## 2023-01-03 NOTE — Discharge Instructions (Signed)
Go straight to the emergency department as soon as you leave urgent care for further evaluation and management. 

## 2023-01-03 NOTE — ED Triage Notes (Signed)
Pt c/o diarrhea, headache, and right ear ache x 3 days. Endorses emesis.   Denies emesis.

## 2023-01-03 NOTE — ED Notes (Signed)
Patient is being discharged from the Urgent Care and sent to the Emergency Department via private vehicle . Per Laren Everts NP, patient is in need of higher level of care due to fever,n/v/d. Patient is aware and verbalizes understanding of plan of care.  Vitals:   01/03/23 1745  BP: 102/71  Pulse: (!) 130  Resp: 16  Temp: 98.1 F (36.7 C)  SpO2: 97%

## 2023-01-03 NOTE — ED Provider Notes (Signed)
[name] [facility] AT [facility] Provider Note   CSN: [id number] Arrival date & time: 01/03/23  1839     History {Add pertinent medical, surgical, social history, OB history to HPI:1} Chief Complaint  Patient presents with  . Diarrhea    Shannon Spears is a 42 y.o. female.   Diarrhea Patient is a 42 year old female with a past medical history significant for reflux, bipolar, anxiety, arthritis, fibromyalgia, depression  She is status post cholecystectomy, oophorectomy and abdominal hysterectomy  She is present     Home Medications Prior to Admission medications   Medication Sig Start Date End Date Taking? Authorizing Provider  albuterol (PROVENTIL) (2.5 MG/3ML) 0.083% nebulizer solution Take 3 mLs (2.5 mg total) by nebulization every 6 (six) hours as needed for wheezing or shortness of breath. 02/01/19   Bing Neighbors, NP  albuterol (VENTOLIN HFA) 108 (90 Base) MCG/ACT inhaler Inhale 2 puffs into the lungs every 6 (six) hours as needed for wheezing or shortness of breath. 02/01/19   Bing Neighbors, NP  CALCIUM-VITAMIN D PO Take 1 tablet by mouth daily.    [provider]  cefUROXime (CEFTIN) 500 MG tablet Take 1 tablet (500 mg total) by mouth 2 (two) times daily with a meal. 08/12/19   Tilden Fossa, MD  cetirizine (ZYRTEC) 10 MG tablet Take 10 mg by mouth daily.    [provider]  cyclobenzaprine (FLEXERIL) 10 MG tablet Take 1 tablet (10 mg total) by mouth 3 (three) times daily as needed for muscle spasms. 06/21/19   Hoy Register, MD  diazepam (VALIUM) 10 MG tablet Take 1 tablet (10 mg total) by mouth 3 (three) times daily as needed for anxiety. 09/19/22   Mozingo, Thereasa Solo, NP  famotidine (PEPCID AC MAXIMUM STRENGTH) 20 MG tablet Take 20 mg by mouth daily.    [provider]  HYDROcodone-acetaminophen (NORCO) 5-325 MG tablet Take 1 tablet by mouth every 6 (six) hours as needed. 11/22/22   Tarry Kos, MD   HYDROcodone-acetaminophen (NORCO) 5-325 MG tablet Take 1 tablet by mouth 2 (two) times daily as needed. 12/24/22   Cristie Hem, PA-C  hydrOXYzine (ATARAX) 25 MG tablet TAKE 1 TABLET BY MOUTH UP TO 6 TIMES DAILY AS NEEDED FOR ANXIETY, ITCHING, AND INSOMNIA. 09/19/22   Mozingo, Thereasa Solo, NP  Lactobacillus (PROBIOTIC ACIDOPHILUS PO) Take 1 capsule by mouth daily.    [provider]  lamoTRIgine (LAMICTAL) 200 MG tablet Take 1 tablet (200 mg total) by mouth at bedtime. 09/19/22   Mozingo, Thereasa Solo, NP  levETIRAcetam (KEPPRA) 500 MG tablet Take 1,000 mg by mouth 2 (two) times daily.    [provider]  lisinopril (ZESTRIL) 2.5 MG tablet Take 2.5 mg by mouth at bedtime.    [provider]  methocarbamol (ROBAXIN-750) 750 MG tablet Take 1 tablet (750 mg total) by mouth 2 (two) times daily as needed for muscle spasms. 12/13/22   Cristie Hem, PA-C  metoprolol tartrate (LOPRESSOR) 25 MG tablet Take 25 mg by mouth 2 (two) times daily.    [provider]  montelukast (SINGULAIR) 10 MG tablet Take 10 mg by mouth at bedtime.    [provider]  ondansetron (ZOFRAN ODT) 4 MG disintegrating tablet Take 1 tablet (4 mg total) by mouth every 8 (eight) hours as needed for nausea or vomiting. 08/12/19   Tilden Fossa, MD  ondansetron (ZOFRAN) 4 MG tablet Take 1-2 tablets (4-8 mg total) by mouth every 8 (eight) hours  as needed for nausea or vomiting. 11/22/22   Tarry Kos, MD  traMADol (ULTRAM) 50 MG tablet Take 1-2 tablets (50-100 mg total) by mouth daily as needed. 12/31/22   Tarry Kos, MD      Allergies    Buprenorphine, Penicillins, Dermatitis antigen, Gabapentin, Nsaids, Propoxyphene, Tetanus toxoids, Adhesive [tape], Codeine, Ibuprofen, and Doxycycline    Review of Systems   Review of Systems  Gastrointestinal:  Positive for diarrhea.    Physical Exam Updated Vital Signs BP 133/87   Pulse 88   Temp 98.8 F (37.1 C) (Oral)   Resp 17    Ht 5\' 1"  (1.549 m)   Wt 72.6 kg   LMP 10/02/2012   SpO2 98%   BMI 30.23 kg/m  Physical Exam  ED Results / Procedures / Treatments   Labs (all labs ordered are listed, but only abnormal results are displayed) Labs Reviewed  COMPREHENSIVE METABOLIC PANEL - Abnormal; Notable for the following components:      Result Value   Sodium 134 (*)    Glucose, Bld 116 (*)    Alkaline Phosphatase 171 (*)    All other components within normal limits  CBC - Abnormal; Notable for the following components:   WBC 14.8 (*)    RBC 5.14 (*)    Platelets 453 (*)    All other components within normal limits  URINALYSIS, ROUTINE W REFLEX MICROSCOPIC - Abnormal; Notable for the following components:   APPearance HAZY (*)    Hgb urine dipstick LARGE (*)    Protein, ur 100 (*)    Bacteria, UA FEW (*)    All other components within normal limits  LIPASE, BLOOD  LACTIC ACID, PLASMA    EKG None  Radiology No results found.  Procedures Procedures  {Document cardiac monitor, telemetry assessment procedure when appropriate:1}  Medications Ordered in ED Medications  sodium chloride 0.9 % bolus 1,000 mL (has no administration in time range)  ondansetron (ZOFRAN) injection 4 mg (has no administration in time range)  dicyclomine (BENTYL) capsule 20 mg (has no administration in time range)    ED Course/ Medical Decision Making/ A&P   {   Click here for ABCD2, HEART and other calculatorsREFRESH Note before signing :1}                          Medical Decision Making Amount and/or Complexity of Data Reviewed Labs: ordered. Radiology: ordered.  Risk Prescription drug management.   ***  {Document critical care time when appropriate:1} {Document review of labs and clinical decision tools ie heart score, Chads2Vasc2 etc:1}  {Document your independent review of radiology images, and any outside records:1} {Document your discussion with family members, caretakers, and with  consultants:1} {Document social determinants of health affecting pt's care:1} {Document your decision making why or why not admission, treatments were needed:1} Final Clinical Impression(s) / ED Diagnoses Final diagnoses:  None    Rx / DC Orders ED Discharge Orders     None

## 2023-01-03 NOTE — ED Provider Notes (Signed)
[name]    CSN: [id number] Arrival date & time: 01/03/23  1721      History   Chief Complaint Chief Complaint  Patient presents with   Diarrhea    I'm extremely sick. My head, eyes, ears and throat are hurtingI'm unable to keep food in me (diarrhea), fever, dehydrated and muscle pain (I guess from dehydration). I did reinjure my hip and I had the surgery around 5 weeks ago so it's been hurting. - Entered by patient    HPI Shannon Spears is a 42 y.o. female.   Patient presents with nausea, vomiting, diarrhea that started 3 days ago.  Reports that she had a low-grade temp as well.  States she has had associated nasal congestion and upper respiratory symptoms  as well.  Denies blood in stool or emesis.  Denies abdominal pain.  Denies any recent unfavorable foods but does report that she ate seafood the day prior to symptoms starting.  Denies any known sick contacts or recent travel outside [place].  Reports that she recently had hip surgery so the hip pain is causing the constantly going to the bathroom more bothersome.  Has been able to tolerate food and fluids but reports that it immediately causes diarrhea.   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   Opioid use disorder, mild, in sustained remission (HCC) 06/16/2018   S/P cholecystectomy 02/10/2018   S/P cervical spinal fusion 12/08/2017    Protrusion of cervical intervertebral disc 12/08/2017   HNP (herniated nucleus pulposus), cervical 10/02/2012    Class: Diagnosis of    Past Surgical History:  Procedure Laterality Date   ANTERIOR CERVICAL DECOMP/DISCECTOMY FUSION N/A 10/02/2012   Procedure: C4-5, C5-6 anterior cervical discectomy and fusion, allograft plate;  Surgeon: Eldred Manges, MD;  Location: [facility] OR;  Service: Orthopedics;  Laterality: N/A;   CHOLECYSTECTOMY     CYSTOGRAPHY     HIP PINNING,CANNULATED Right 11/22/2022   Procedure: RIGHT HIP PINNING;  Surgeon: Tarry Kos, MD;  Location: [facility] OR;  Service: Orthopedics;  Laterality: Right;   LAPAROSCOPIC TOTAL HYSTERECTOMY     OOPHORECTOMY     TUBAL LIGATION      OB History   No obstetric history on file.      Home Medications    Prior to Admission medications   Medication Sig Start Date End Date Taking? Authorizing Provider  albuterol (PROVENTIL) (2.5 MG/3ML) 0.083% nebulizer solution Take 3 mLs (2.5 mg total) by nebulization every 6 (six) hours as needed for wheezing or shortness of breath. 02/01/19  Yes Bing Neighbors, NP  albuterol (VENTOLIN HFA) 108 (90 Base) MCG/ACT inhaler Inhale 2 puffs into the lungs every 6 (six) hours as needed for wheezing or shortness of breath. 02/01/19  Yes Bing Neighbors, NP  CALCIUM-VITAMIN D PO Take 1 tablet by mouth daily.   Yes [provider]  cefUROXime (CEFTIN) 500 MG tablet Take 1 tablet (500 mg total) by mouth 2 (two) times daily with a meal. 08/12/19  Yes Tilden Fossa, MD  cetirizine (ZYRTEC) 10 MG tablet Take 10 mg by mouth daily.   Yes [provider]  cyclobenzaprine (FLEXERIL) 10 MG tablet Take 1 tablet (10 mg total) by mouth 3 (three) times daily as needed for muscle spasms. 06/21/19  Yes Hoy Register, MD  diazepam (VALIUM) 10 MG tablet Take 1 tablet (10 mg total) by mouth 3 (three) times daily as needed for anxiety. 09/19/22  Yes Mozingo, Thereasa Solo, NP  famotidine (PEPCID AC MAXIMUM  STRENGTH) 20 MG tablet Take 20 mg by mouth daily.   Yes [provider]  HYDROcodone-acetaminophen (NORCO) 5-325 MG tablet Take 1 tablet by mouth every 6 (six) hours as needed. 11/22/22  Yes Tarry Kos, MD  HYDROcodone-acetaminophen (NORCO) 5-325 MG tablet Take 1 tablet by mouth 2 (two) times daily as needed. 12/24/22  Yes Cristie Hem, PA-C  hydrOXYzine (ATARAX) 25 MG tablet TAKE 1 TABLET BY MOUTH UP TO 6 TIMES DAILY AS NEEDED FOR ANXIETY, ITCHING, AND INSOMNIA. 09/19/22  Yes Mozingo, Thereasa Solo, NP  Lactobacillus (PROBIOTIC ACIDOPHILUS PO) Take 1 capsule by mouth daily.   Yes [provider]  lamoTRIgine (LAMICTAL) 200 MG tablet Take 1 tablet (200 mg total) by mouth at bedtime. 09/19/22  Yes Mozingo, Thereasa Solo, NP  levETIRAcetam (KEPPRA) 500 MG tablet Take 1,000 mg by mouth 2 (two) times daily.   Yes [provider]  lisinopril (ZESTRIL) 2.5 MG tablet Take 2.5 mg by mouth at bedtime.   Yes [provider]  methocarbamol (ROBAXIN-750) 750 MG tablet Take 1 tablet (750 mg total) by mouth 2 (two) times daily as needed for muscle spasms. 12/13/22  Yes Cristie Hem, PA-C  metoprolol tartrate (LOPRESSOR) 25 MG tablet Take 25 mg by mouth 2 (two) times daily.   Yes [provider]  montelukast (SINGULAIR) 10 MG tablet Take 10 mg by mouth at bedtime.   Yes [provider]  ondansetron (ZOFRAN ODT) 4 MG disintegrating tablet Take 1 tablet (4 mg total) by mouth every 8 (eight) hours as needed for nausea or vomiting. 08/12/19  Yes Tilden Fossa, MD  ondansetron (ZOFRAN) 4 MG tablet Take 1-2 tablets (4-8 mg total) by mouth every 8 (eight) hours as needed for nausea or vomiting. 11/22/22  Yes Tarry Kos, MD  traMADol (ULTRAM) 50 MG tablet Take 1-2 tablets (50-100 mg total) by mouth daily as needed. 12/31/22  Yes Tarry Kos, MD    Family History Family History  Problem Relation Age of Onset   Bipolar disorder Mother    Diabetes Mother     Hypertension Mother    Hypertension Father    Colon cancer Paternal Grandmother     Social History Social History   Tobacco Use   Smoking status: Every Day    Packs/day: .5    Types: Cigarettes   Smokeless tobacco: Never  Vaping Use   Vaping Use: Never used  Substance Use Topics   Alcohol use: Yes    Comment: rarely   Drug use: Yes    Types: Marijuana    Comment: occasionally     Allergies   Buprenorphine, Penicillins, Dermatitis antigen, Gabapentin, Nsaids, Propoxyphene, Tetanus toxoids, Adhesive [tape], Codeine, Ibuprofen, and Doxycycline   Review of Systems Review of Systems Per HPI  Physical Exam Triage Vital Signs ED Triage Vitals  Enc Vitals Group  BP 01/03/23 1745 102/71     Pulse Rate 01/03/23 1745 (!) 130     Resp 01/03/23 1745 16     Temp 01/03/23 1745 98.1 F (36.7 C)     Temp Source 01/03/23 1745 Oral     SpO2 01/03/23 1745 97 %     Weight 01/03/23 1747 160 lb (72.6 kg)     Height 01/03/23 1747 5\' 1"  (1.549 m)     Head Circumference --      Peak Flow --      Pain Score 01/03/23 1747 8     Pain Loc --      Pain Edu? --      Excl. in GC? --    No data found.  Updated Vital Signs BP 102/71 (BP Location: Left Arm)   Pulse (!) 130   Temp 98.1 F (36.7 C) (Oral)   Resp 16   Ht 5\' 1"  (1.549 m)   Wt 160 lb (72.6 kg)   LMP 10/02/2012   SpO2 97%   BMI 30.23 kg/m   Visual Acuity Right Eye Distance:   Left Eye Distance:   Bilateral Distance:    Right Eye Near:   Left Eye Near:    Bilateral Near:     Physical Exam Constitutional:      General: She is not in acute distress.    Appearance: Normal appearance. She is not toxic-appearing or diaphoretic.  HENT:     Head: Normocephalic and atraumatic.  Eyes:     Extraocular Movements: Extraocular movements intact.     Conjunctiva/sclera: Conjunctivae normal.  Cardiovascular:     Rate and Rhythm: Regular rhythm. Tachycardia present.     Pulses: Normal pulses.     Heart sounds:  Normal heart sounds.  Pulmonary:     Effort: Pulmonary effort is normal. No respiratory distress.     Breath sounds: Normal breath sounds.  Abdominal:     General: Bowel sounds are normal. There is no distension.     Palpations: Abdomen is soft.     Tenderness: There is no abdominal tenderness.  Neurological:     General: No focal deficit present.     Mental Status: She is alert and oriented to person, place, and time. Mental status is at baseline.  Psychiatric:        Mood and Affect: Mood normal.        Behavior: Behavior normal.        Thought Content: Thought content normal.        Judgment: Judgment normal.      UC Treatments / Results  Labs (all labs ordered are listed, but only abnormal results are displayed) Labs Reviewed - No data to display  EKG   Radiology No results found.  Procedures Procedures (including critical care time)  Medications Ordered in UC Medications - No data to display  Initial Impression / Assessment and Plan / UC Course  I have reviewed the triage vital signs and the nursing notes.  Pertinent labs & imaging results that were available during my care of the patient were reviewed by me and considered in my medical decision making (see chart for details).     Most likely viral illness.  I am concerned for dehydration due to nausea, vomiting, diarrhea given patient has tachycardia on exam.  Therefore, patient was advised to go to the ER for further evaluation and management and was agreeable with plan.  Patient left via her family member transporting her to the  ER. Final Clinical Impressions(s) / UC Diagnoses   Final diagnoses:  Nausea vomiting and diarrhea  Tachycardia     Discharge Instructions      Go straight to the emergency department as soon as you leave urgent care for further evaluation and management.    ED Prescriptions   None    PDMP not reviewed this encounter.   Gustavus Bryant, [place] 01/03/23 [phone]

## 2023-01-07 ENCOUNTER — Other Ambulatory Visit: Payer: Self-pay | Admitting: Adult Health

## 2023-01-07 DIAGNOSIS — F4312 Post-traumatic stress disorder, chronic: Secondary | ICD-10-CM

## 2023-01-07 DIAGNOSIS — F451 Undifferentiated somatoform disorder: Secondary | ICD-10-CM

## 2023-01-08 ENCOUNTER — Other Ambulatory Visit: Payer: Self-pay | Admitting: Adult Health

## 2023-01-08 DIAGNOSIS — F3175 Bipolar disorder, in partial remission, most recent episode depressed: Secondary | ICD-10-CM

## 2023-01-08 MED ORDER — DIAZEPAM 10 MG PO TABS
10.0000 mg | ORAL_TABLET | Freq: Three times a day (TID) | ORAL | 2 refills | Status: DC | PRN
Start: 2023-01-08 — End: 2023-03-24

## 2023-01-08 NOTE — Progress Notes (Signed)
Refill

## 2023-01-08 NOTE — Telephone Encounter (Signed)
Pt called and said she needs a refill on her valium  and also her hydroxine.

## 2023-01-18 ENCOUNTER — Emergency Department (HOSPITAL_COMMUNITY): Payer: Commercial Managed Care - HMO

## 2023-01-18 ENCOUNTER — Other Ambulatory Visit: Payer: Self-pay

## 2023-01-18 ENCOUNTER — Observation Stay (HOSPITAL_COMMUNITY)
Admission: EM | Admit: 2023-01-18 | Discharge: 2023-01-19 | Disposition: A | Discharge status: Home / Self Care | Payer: Commercial Managed Care - HMO | Attending: Internal Medicine | Admitting: Internal Medicine

## 2023-01-18 ENCOUNTER — Encounter (HOSPITAL_COMMUNITY): Payer: Self-pay | Admitting: Internal Medicine

## 2023-01-18 ENCOUNTER — Observation Stay (HOSPITAL_COMMUNITY): Payer: Commercial Managed Care - HMO

## 2023-01-18 DIAGNOSIS — F319 Bipolar disorder, unspecified: Secondary | ICD-10-CM | POA: Diagnosis not present

## 2023-01-18 DIAGNOSIS — J189 Pneumonia, unspecified organism: Principal | ICD-10-CM | POA: Diagnosis present

## 2023-01-18 DIAGNOSIS — N189 Chronic kidney disease, unspecified: Secondary | ICD-10-CM | POA: Insufficient documentation

## 2023-01-18 DIAGNOSIS — A419 Sepsis, unspecified organism: Secondary | ICD-10-CM | POA: Diagnosis not present

## 2023-01-18 DIAGNOSIS — R0789 Other chest pain: Secondary | ICD-10-CM | POA: Diagnosis present

## 2023-01-18 DIAGNOSIS — F17210 Nicotine dependence, cigarettes, uncomplicated: Secondary | ICD-10-CM | POA: Diagnosis not present

## 2023-01-18 DIAGNOSIS — Z79899 Other long term (current) drug therapy: Secondary | ICD-10-CM | POA: Diagnosis not present

## 2023-01-18 DIAGNOSIS — K219 Gastro-esophageal reflux disease without esophagitis: Secondary | ICD-10-CM | POA: Diagnosis present

## 2023-01-18 DIAGNOSIS — I10 Essential (primary) hypertension: Secondary | ICD-10-CM | POA: Diagnosis present

## 2023-01-18 DIAGNOSIS — F411 Generalized anxiety disorder: Secondary | ICD-10-CM | POA: Diagnosis present

## 2023-01-18 DIAGNOSIS — J4520 Mild intermittent asthma, uncomplicated: Secondary | ICD-10-CM | POA: Diagnosis not present

## 2023-01-18 DIAGNOSIS — I129 Hypertensive chronic kidney disease with stage 1 through stage 4 chronic kidney disease, or unspecified chronic kidney disease: Secondary | ICD-10-CM | POA: Diagnosis not present

## 2023-01-18 DIAGNOSIS — R0602 Shortness of breath: Secondary | ICD-10-CM | POA: Diagnosis present

## 2023-01-18 DIAGNOSIS — R000 Tachycardia, unspecified: Secondary | ICD-10-CM | POA: Diagnosis not present

## 2023-01-18 LAB — CBC WITH DIFFERENTIAL/PLATELET
Abs Immature Granulocytes: 0.04 10*3/uL (ref 0.00–0.07)
Basophils Absolute: 0.1 10*3/uL (ref 0.0–0.1)
Basophils Relative: 1 %
Eosinophils Absolute: 0.3 10*3/uL (ref 0.0–0.5)
Eosinophils Relative: 2 %
HCT: 38.3 % (ref 36.0–46.0)
Hemoglobin: 12.4 g/dL (ref 12.0–15.0)
Immature Granulocytes: 0 %
Lymphocytes Relative: 32 %
Lymphs Abs: 3.8 10*3/uL (ref 0.7–4.0)
MCH: 27.3 pg (ref 26.0–34.0)
MCHC: 32.4 g/dL (ref 30.0–36.0)
MCV: 84.4 fL (ref 80.0–100.0)
Monocytes Absolute: 0.6 10*3/uL (ref 0.1–1.0)
Monocytes Relative: 5 %
Neutro Abs: 7.3 10*3/uL (ref 1.7–7.7)
Neutrophils Relative %: 60 %
Platelets: 494 10*3/uL — ABNORMAL HIGH (ref 150–400)
RBC: 4.54 MIL/uL (ref 3.87–5.11)
RDW: 14 % (ref 11.5–15.5)
WBC: 12.1 10*3/uL — ABNORMAL HIGH (ref 4.0–10.5)
nRBC: 0 % (ref 0.0–0.2)

## 2023-01-18 LAB — LACTIC ACID, PLASMA: Lactic Acid, Venous: 1.4 mmol/L (ref 0.5–1.9)

## 2023-01-18 LAB — D-DIMER, QUANTITATIVE: D-Dimer, Quant: 0.42 ug/mL-FEU (ref 0.00–0.50)

## 2023-01-18 LAB — TROPONIN I (HIGH SENSITIVITY)
Troponin I (High Sensitivity): 6 ng/L (ref <18)
Troponin I (High Sensitivity): 4 ng/L (ref <18)

## 2023-01-18 LAB — BASIC METABOLIC PANEL
Anion gap: 10 (ref 5–15)
BUN: 6 mg/dL (ref 6–20)
CO2: 23 mmol/L (ref 22–32)
Calcium: 8.6 mg/dL — ABNORMAL LOW (ref 8.9–10.3)
Chloride: 103 mmol/L (ref 98–111)
Creatinine, Ser: 0.94 mg/dL (ref 0.44–1.00)
GFR, Estimated: >60 mL/min (ref >60)
Glucose, Bld: 97 mg/dL (ref 70–99)
Potassium: 3.8 mmol/L (ref 3.5–5.1)
Sodium: 136 mmol/L (ref 135–145)

## 2023-01-18 LAB — I-STAT BETA HCG BLOOD, ED (MC, WL, AP ONLY): I-stat hCG, quantitative: 11.3 m[IU]/mL — ABNORMAL HIGH (ref <5)

## 2023-01-18 LAB — MAGNESIUM: Magnesium: 1.9 mg/dL (ref 1.7–2.4)

## 2023-01-18 LAB — TSH: TSH: 2 u[IU]/mL (ref 0.350–4.500)

## 2023-01-18 LAB — CBG MONITORING, ED: Glucose-Capillary: 101 mg/dL — ABNORMAL HIGH (ref 70–99)

## 2023-01-18 MED ORDER — LORAZEPAM 2 MG/ML IJ SOLN
1.0000 mg | Freq: Once | INTRAMUSCULAR | Status: AC
  Administered 2023-01-18: 1 mg via INTRAVENOUS
  Filled 2023-01-18: qty 1

## 2023-01-18 MED ORDER — NICOTINE 14 MG/24HR TD PT24: 14.0000 mg | MEDICATED_PATCH | Freq: Every day | TRANSDERMAL | Status: DC | PRN

## 2023-01-18 MED ORDER — LACTATED RINGERS IV SOLN: INTRAVENOUS | Status: AC

## 2023-01-18 MED ORDER — IOHEXOL 350 MG/ML SOLN
75.0000 mL | Freq: Once | INTRAVENOUS | Status: AC | PRN
  Administered 2023-01-18: 75 mL via INTRAVENOUS

## 2023-01-18 MED ORDER — KETOROLAC TROMETHAMINE 15 MG/ML IJ SOLN: 15.0000 mg | Freq: Four times a day (QID) | INTRAMUSCULAR | Status: DC | PRN

## 2023-01-18 MED ORDER — ACETAMINOPHEN 650 MG RE SUPP: 650.0000 mg | Freq: Four times a day (QID) | RECTAL | Status: DC | PRN

## 2023-01-18 MED ORDER — DIAZEPAM 5 MG PO TABS: 10.0000 mg | ORAL_TABLET | Freq: Three times a day (TID) | ORAL | Status: DC | PRN

## 2023-01-18 MED ORDER — LEVETIRACETAM 500 MG PO TABS
1000.0000 mg | ORAL_TABLET | Freq: Two times a day (BID) | ORAL | Status: DC
  Administered 2023-01-18 – 2023-01-19 (×2): 1000 mg via ORAL
  Filled 2023-01-18 (×2): qty 2

## 2023-01-18 MED ORDER — DIPHENHYDRAMINE HCL 50 MG/ML IJ SOLN
25.0000 mg | Freq: Once | INTRAMUSCULAR | Status: AC
  Administered 2023-01-18: 25 mg via INTRAVENOUS
  Filled 2023-01-18: qty 1

## 2023-01-18 MED ORDER — LEVOFLOXACIN IN D5W 750 MG/150ML IV SOLN
750.0000 mg | Freq: Once | INTRAVENOUS | Status: AC
  Administered 2023-01-18: 750 mg via INTRAVENOUS
  Filled 2023-01-18: qty 150

## 2023-01-18 MED ORDER — ONDANSETRON HCL 4 MG/2ML IJ SOLN: 4.0000 mg | Freq: Four times a day (QID) | INTRAMUSCULAR | Status: DC | PRN

## 2023-01-18 MED ORDER — LACTATED RINGERS IV BOLUS
1000.0000 mL | Freq: Once | INTRAVENOUS | Status: AC
  Administered 2023-01-18: 1000 mL via INTRAVENOUS

## 2023-01-18 MED ORDER — POTASSIUM CHLORIDE CRYS ER 20 MEQ PO TBCR
40.0000 meq | EXTENDED_RELEASE_TABLET | Freq: Once | ORAL | Status: AC
  Administered 2023-01-18: 40 meq via ORAL
  Filled 2023-01-18: qty 2

## 2023-01-18 MED ORDER — METOPROLOL SUCCINATE ER 50 MG PO TB24
50.0000 mg | ORAL_TABLET | Freq: Every day | ORAL | Status: DC
  Administered 2023-01-19 (×2): 50 mg via ORAL
  Filled 2023-01-18 (×2): qty 1

## 2023-01-18 MED ORDER — ACETAMINOPHEN 325 MG PO TABS
650.0000 mg | ORAL_TABLET | Freq: Once | ORAL | Status: AC
  Administered 2023-01-18: 650 mg via ORAL
  Filled 2023-01-18: qty 2

## 2023-01-18 MED ORDER — FAMOTIDINE 20 MG PO TABS
20.0000 mg | ORAL_TABLET | Freq: Two times a day (BID) | ORAL | Status: DC
  Administered 2023-01-18 – 2023-01-19 (×2): 20 mg via ORAL
  Filled 2023-01-18 (×2): qty 1

## 2023-01-18 MED ORDER — LORAZEPAM 2 MG/ML IJ SOLN: 1.0000 mg | INTRAMUSCULAR | Status: DC | PRN

## 2023-01-18 MED ORDER — LAMOTRIGINE 100 MG PO TABS
200.0000 mg | ORAL_TABLET | Freq: Every day | ORAL | Status: DC
  Administered 2023-01-18: 200 mg via ORAL
  Filled 2023-01-18: qty 2

## 2023-01-18 MED ORDER — PROCHLORPERAZINE EDISYLATE 10 MG/2ML IJ SOLN
10.0000 mg | Freq: Once | INTRAMUSCULAR | Status: AC
  Administered 2023-01-18: 10 mg via INTRAVENOUS
  Filled 2023-01-18: qty 2

## 2023-01-18 MED ORDER — LEVALBUTEROL HCL 1.25 MG/0.5ML IN NEBU: 1.2500 mg | INHALATION_SOLUTION | RESPIRATORY_TRACT | Status: DC | PRN

## 2023-01-18 MED ORDER — MELATONIN 3 MG PO TABS: 3.0000 mg | ORAL_TABLET | Freq: Every evening | ORAL | Status: DC | PRN

## 2023-01-18 MED ORDER — MONTELUKAST SODIUM 10 MG PO TABS
10.0000 mg | ORAL_TABLET | Freq: Every day | ORAL | Status: DC
  Administered 2023-01-19: 10 mg via ORAL
  Filled 2023-01-18: qty 1

## 2023-01-18 MED ORDER — NITROGLYCERIN 0.4 MG SL SUBL: 0.4000 mg | SUBLINGUAL_TABLET | SUBLINGUAL | Status: DC | PRN

## 2023-01-18 MED ORDER — SODIUM CHLORIDE 0.9 % IV BOLUS
1000.0000 mL | Freq: Once | INTRAVENOUS | Status: AC
  Administered 2023-01-18: 1000 mL via INTRAVENOUS

## 2023-01-18 MED ORDER — CYCLOBENZAPRINE HCL 5 MG PO TABS
10.0000 mg | ORAL_TABLET | Freq: Three times a day (TID) | ORAL | Status: DC | PRN
  Administered 2023-01-19: 10 mg via ORAL
  Filled 2023-01-18: qty 2

## 2023-01-18 MED ORDER — METOCLOPRAMIDE HCL 5 MG/ML IJ SOLN
10.0000 mg | Freq: Once | INTRAMUSCULAR | Status: AC
  Administered 2023-01-18: 10 mg via INTRAVENOUS
  Filled 2023-01-18: qty 2

## 2023-01-18 MED ORDER — ACETAMINOPHEN 325 MG PO TABS: 650.0000 mg | ORAL_TABLET | Freq: Four times a day (QID) | ORAL | Status: DC | PRN

## 2023-01-18 MED ORDER — LEVOFLOXACIN IN D5W 750 MG/150ML IV SOLN: 750.0000 mg | INTRAVENOUS | Status: DC

## 2023-01-18 NOTE — ED Triage Notes (Signed)
Patient BIB EMS from home with c/o headache with dizziness, N/V. No LOC. 400 IVF/ 20gRAC 150/80,108. CBG 104

## 2023-01-18 NOTE — Progress Notes (Signed)
Pharmacy Antibiotic Note  Shannon Spears is a 42 y.o. female admitted on 01/18/2023 with pneumonia.  Pharmacy has been consulted for levaquin dosing.  Plan: Levaquin 750mg  Q24H.  Follow culture data for de-escalation.  Monitor renal function for dose adjustments as indicated.   Weight: 72.6 kg (160 lb 0.9 oz)  Temp (24hrs), Avg:98.1 F (36.7 C), Min:98 F (36.7 C), Max:98.2 F (36.8 C)  Recent Labs  Lab 01/18/23 1708  WBC 12.1*  CREATININE 0.94    Estimated Creatinine Clearance: 71 mL/min (by C-G formula based on SCr of 0.94 mg/dL).    Allergies  Allergen Reactions   Buprenorphine Shortness Of Breath and Rash    Breathing problems   Penicillins Anaphylaxis, Nausea And Vomiting and Swelling    TOLERATES ANCEF   Dermatitis Antigen     Other reaction(s): Dermatitis  Tape   Gabapentin Nausea And Vomiting   Nsaids     Renal insufficiency   Propoxyphene Nausea Only    darvocet   Tetanus Toxoids     Blister on legs   Adhesive [Tape] Dermatitis   Codeine     Mouth went numb   Ibuprofen     Has kidney disease.   Doxycycline Rash    Thank you for allowing pharmacy to be a part of this patient's care.  Estill Batten, PharmD, BCCCP  01/18/2023 8:58 PM

## 2023-01-18 NOTE — H&P (Signed)
History and Physical      Shannon Spears ZOX:[id number] DOB: 1980/12/18 DOA: 01/18/2023; DOS: 01/18/2023  PCP: Default, Provider, MD  Patient coming from: home   I have personally briefly reviewed patient's old medical records in [facility]  Chief Complaint: Shortness of breath  HPI: Shannon Spears is a 42 y.o. female with medical history significant for mild intermittent asthma, essential pretension, bipolar disorder, generalized anxiety disorder, GERD, who is admitted to [facility] on 01/18/2023 with sepsis due to community-acquired pneumonia after presenting from home to [facility] ED complaining of shortness of breath.   The patient reports to 3 days of progressive shortness of breath associated with some bilateral nonradiating chest wall discomfort, which she describes as sharp, and intensifying with deep inspiration or cough.  Denies any associated mopped assist.  She notes that this is also been associated with intermittent nausea, in the absence of any vomiting.  She conveys that her chest discomfort is nonexertional, denies any recent trauma.  She notes some associated palpitations, in the absence of any diaphoresis, presyncope, or syncope.  No wheezing, rhinitis, rhinorrhea.  Denies any associated subjective fever, chills, rigors, or generalized myalgias.  No recent neck stiffness, rash, abdominal pain, diarrhea, dysuria, gross hematuria.  She follows with outpatient cardiology through [facility] in the setting of chronic sinus tachycardia. Per recent appointment for such, it appears that she was instructed to change her outpatient metoprolol to tartrate from 25 mg p.o. twice daily to equivalent dosing of metoprolol succinate, but has not yet had the opportunity to procure this updated beta-blocker from the pharmacy.  Pertinent review, she also carries a diagnosis of mild intermittent asthma, and is on prn albuterol inhaler as an outpatient.  She notes some recent increase use of her prn  albuterol inhaler in the setting of presenting shortness of breath, but without any significant improvement in the above symptoms, prompting her to present to [facility] emergency department seen for further evaluation management thereof.  It is noted that she underwent hip replacement in April 2024.  She conveys that she had been on postoperative opioid analgesia up until approximately 2 weeks ago.      ED Course:  Vital signs in the ED were notable for the following: Afebrile; heart rate in the 120s; systolic pressures in the low 811B to 1 teens; respiratory rate 16-24, oxygen saturation 97 to 100% on room air.  Labs were notable for the following: CMP notable for the following: Sodium 136, bicarbonate 23, creatinine 0.94.  High sensitive troponin I initially 6, 3 value trending down to 4.  TSH 2.0.  CBC notable for white cell count 12,100, hemoglobin 12.4.  Urinary drug screen ordered, with result currently pending.  Per my interpretation, EKG in ED demonstrated the following: Sinus tachycardia with heart rate 126, normal intervals, no evidence of T wave or ST changes, patient evidence of ST ovation.  Imaging and additional notable ED work-up: CTA chest, performed radiology read, showed no evidence of acute pulmonary embolism, but did show multifocal airspace opacities in the bilateral lower lobes, right middle lobe, left upper lobe, concerning for multifocal pneumonia, in the absence of any evidence of edema, effusion, or pneumothorax.  While in the ED, the following were administered: Acetaminophen 6 or 50 mg p.o. x 1 dose, Benadryl 25 mg IV x 1, Ativan 1 mg IV x 1, Reglan 10 mg IV x 1, Compazine 10 mg IV x 1, normal saline x 1 L bolus, Levaquin.  Subsequently, the patient was  admitted for further evaluation management of presenting sepsis due to community-acquired pneumonia, with presentation also notable for atypical chest pain and sinus tachycardia.      Review of Systems: As per HPI  otherwise 10 point review of systems negative.   Past Medical History:  Diagnosis Date   Anxiety    Arthritis    Bipolar 1 disorder, manic, mild (HCC)    CKD (chronic kidney disease)    Depression    Fibromyalgia    GERD (gastroesophageal reflux disease)    Headache(784.0)    Palpitations     Past Surgical History:  Procedure Laterality Date   ANTERIOR CERVICAL DECOMP/DISCECTOMY FUSION N/A 10/02/2012   Procedure: C4-5, C5-6 anterior cervical discectomy and fusion, allograft plate;  Surgeon: Eldred Manges, MD;  Location: [facility] OR;  Service: Orthopedics;  Laterality: N/A;   CHOLECYSTECTOMY     CYSTOGRAPHY     HIP PINNING,CANNULATED Right 11/22/2022   Procedure: RIGHT HIP PINNING;  Surgeon: Tarry Kos, MD;  Location: [facility] OR;  Service: Orthopedics;  Laterality: Right;   LAPAROSCOPIC TOTAL HYSTERECTOMY     OOPHORECTOMY     TUBAL LIGATION      Social History:  reports that she has been smoking cigarettes. She has been smoking an average of .5 packs per day. She has never used smokeless tobacco. She reports current alcohol use. She reports current drug use. Drug: Marijuana.   Allergies  Allergen Reactions   Buprenorphine Shortness Of Breath and Rash    Breathing problems   Penicillins Anaphylaxis, Nausea And Vomiting and Swelling    TOLERATES ANCEF   Dermatitis Antigen     Other reaction(s): Dermatitis  Tape   Gabapentin Nausea And Vomiting   Nsaids     Renal insufficiency   Propoxyphene Nausea Only    darvocet   Tetanus Toxoids     Blister on legs   Adhesive [Tape] Dermatitis   Codeine     Mouth went numb   Ibuprofen     Has kidney disease.   Doxycycline Rash    Family History  Problem Relation Age of Onset   Bipolar disorder Mother    Diabetes Mother    Hypertension Mother    Hypertension Father    Colon cancer Paternal Grandmother     Family history reviewed and not pertinent    Prior to Admission medications   Medication Sig Start Date End Date Taking?  Authorizing Provider  albuterol (PROVENTIL) (2.5 MG/3ML) 0.083% nebulizer solution Take 3 mLs (2.5 mg total) by nebulization every 6 (six) hours as needed for wheezing or shortness of breath. 02/01/19  Yes Bing Neighbors, NP  albuterol (VENTOLIN HFA) 108 (90 Base) MCG/ACT inhaler Inhale 2 puffs into the lungs every 6 (six) hours as needed for wheezing or shortness of breath. 02/01/19  Yes Bing Neighbors, NP  CALCIUM-VITAMIN D PO Take 1 tablet by mouth daily.   Yes [provider]  cyclobenzaprine (FLEXERIL) 10 MG tablet Take 1 tablet (10 mg total) by mouth 3 (three) times daily as needed for muscle spasms. 06/21/19  Yes Hoy Register, MD  diazepam (VALIUM) 10 MG tablet Take 1 tablet (10 mg total) by mouth 3 (three) times daily as needed for anxiety. 01/08/23  Yes Mozingo, Thereasa Solo, NP  famotidine (PEPCID AC MAXIMUM STRENGTH) 20 MG tablet Take 20 mg by mouth 2 (two) times daily.   Yes [provider]  hydrOXYzine (ATARAX) 25 MG tablet TAKE 1 TABLET BY MOUTH UP TO 6 TIMES  PERDAY AS NEEDED FOR ANXIETY, ITCHING, AND INSOMNIA Patient taking differently: Take 25 mg by mouth See admin instructions. TAKE 1 TABLET BY MOUTH UP TO 6 TIMES PERDAY AS NEEDED FOR ANXIETY, ITCHING, AND INSOMNIA 01/08/23  Yes Mozingo, Thereasa Solo, NP  Lactobacillus (PROBIOTIC ACIDOPHILUS PO) Take 1 capsule by mouth daily.   Yes [provider]  lamoTRIgine (LAMICTAL) 200 MG tablet Take 1 tablet (200 mg total) by mouth at bedtime. 09/19/22  Yes Mozingo, Thereasa Solo, NP  levETIRAcetam (KEPPRA) 500 MG tablet Take 1,000 mg by mouth 2 (two) times daily.   Yes [provider]  lisinopril (ZESTRIL) 2.5 MG tablet Take 2.5 mg by mouth at bedtime.   Yes [provider]  metoprolol tartrate (LOPRESSOR) 25 MG tablet Take 25 mg by mouth 2 (two) times daily.   Yes [provider]  montelukast (SINGULAIR) 10 MG tablet Take 10 mg by mouth daily.   Yes [provider]   ondansetron (ZOFRAN-ODT) 4 MG disintegrating tablet Take 1 tablet (4 mg total) by mouth every 8 (eight) hours as needed for nausea or vomiting. 01/03/23  Yes Fondaw, Wylder S, PA  cefUROXime (CEFTIN) 500 MG tablet Take 1 tablet (500 mg total) by mouth 2 (two) times daily with a meal. Patient not taking: Reported on 01/18/2023 08/12/19   Tilden Fossa, MD  dicyclomine (BENTYL) 20 MG tablet Take 1 tablet (20 mg total) by mouth 2 (two) times daily. Patient not taking: Reported on 01/18/2023 01/03/23   Gailen Shelter, PA  HYDROcodone-acetaminophen (NORCO) 5-325 MG tablet Take 1 tablet by mouth every 6 (six) hours as needed. Patient not taking: Reported on 01/18/2023 11/22/22   Tarry Kos, MD  HYDROcodone-acetaminophen (NORCO) 5-325 MG tablet Take 1 tablet by mouth 2 (two) times daily as needed. Patient not taking: Reported on 01/18/2023 12/24/22   Cristie Hem, PA-C  methocarbamol (ROBAXIN-750) 750 MG tablet Take 1 tablet (750 mg total) by mouth 2 (two) times daily as needed for muscle spasms. Patient not taking: Reported on 01/18/2023 12/13/22   Cristie Hem, PA-C  metoprolol succinate (TOPROL-XL) 25 MG 24 hr tablet Take 25 mg by mouth daily. 01/15/23   [provider]  ondansetron (ZOFRAN) 4 MG tablet Take 1-2 tablets (4-8 mg total) by mouth every 8 (eight) hours as needed for nausea or vomiting. Patient not taking: Reported on 01/18/2023 11/22/22   Tarry Kos, MD  traMADol (ULTRAM) 50 MG tablet Take 1-2 tablets (50-100 mg total) by mouth daily as needed. Patient not taking: Reported on 01/18/2023 12/31/22   Tarry Kos, MD     Objective    Physical Exam: Vitals:   01/18/23 2015 01/18/23 2030 01/18/23 2047 01/18/23 2053  BP: (!) 106/58 101/64    Pulse: (!) 123 (!) 123    Resp:  19    Temp:   98 F (36.7 C)   TempSrc:      SpO2: 100% 97%    Weight:    72.6 kg    General: appears to be stated age; alert, oriented; mildly increased work of breathing noted Skin: warm, dry,  no rash Head:  AT/[place] Mouth:  Oral mucosa membranes appear moist, normal dentition Neck: supple; trachea midline Heart: Tachycardic, but regular; did not appreciate any M/R/G Lungs: CTAB, did not appreciate any wheezes, rales, or rhonchi Abdomen: + BS; soft, ND, NT Vascular: 2+ pedal pulses b/l; 2+ radial pulses b/l Extremities: no peripheral edema, no muscle wasting Neuro: strength and sensation intact in  upper and lower extremities b/l    Labs on Admission: I have personally reviewed following labs and imaging studies  CBC: Recent Labs  Lab 01/18/23 1708  WBC 12.1*  NEUTROABS 7.3  HGB 12.4  HCT 38.3  MCV 84.4  PLT 494*   Basic Metabolic Panel: Recent Labs  Lab 01/18/23 1708  NA 136  K 3.8  CL 103  CO2 23  GLUCOSE 97  BUN 6  CREATININE 0.94  CALCIUM 8.6*   GFR: Estimated Creatinine Clearance: 71 mL/min (by C-G formula based on SCr of 0.94 mg/dL). Liver Function Tests: No results for input(s): "AST", "ALT", "ALKPHOS", "BILITOT", "PROT", "ALBUMIN" in the last 168 hours. No results for input(s): "LIPASE", "AMYLASE" in the last 168 hours. No results for input(s): "AMMONIA" in the last 168 hours. Coagulation Profile: No results for input(s): "INR", "PROTIME" in the last 168 hours. Cardiac Enzymes: No results for input(s): "CKTOTAL", "CKMB", "CKMBINDEX", "TROPONINI" in the last 168 hours. BNP (last 3 results) No results for input(s): "PROBNP" in the last 8760 hours. HbA1C: No results for input(s): "HGBA1C" in the last 72 hours. CBG: Recent Labs  Lab 01/18/23 1650  GLUCAP 101*   Lipid Profile: No results for input(s): "CHOL", "HDL", "LDLCALC", "TRIG", "CHOLHDL", "LDLDIRECT" in the last 72 hours. Thyroid Function Tests: Recent Labs    01/18/23 1708  TSH 2.000   Anemia Panel: No results for input(s): "VITAMINB12", "FOLATE", "FERRITIN", "TIBC", "IRON", "RETICCTPCT" in the last 72 hours. Urine analysis:    Component Value Date/Time   COLORURINE YELLOW  01/03/2023 1839   APPEARANCEUR HAZY (A) 01/03/2023 1839   LABSPEC 1.014 01/03/2023 1839   PHURINE 5.0 01/03/2023 1839   GLUCOSEU NEGATIVE 01/03/2023 1839   HGBUR LARGE (A) 01/03/2023 1839   BILIRUBINUR NEGATIVE 01/03/2023 1839   BILIRUBINUR negative 02/01/2019 1022   KETONESUR NEGATIVE 01/03/2023 1839   PROTEINUR 100 (A) 01/03/2023 1839   UROBILINOGEN 0.2 02/01/2019 1022   UROBILINOGEN 0.2 09/23/2012 1436   NITRITE NEGATIVE 01/03/2023 1839   LEUKOCYTESUR NEGATIVE 01/03/2023 1839    Radiological Exams on Admission: CT Angio Chest PE W and/or Wo Contrast  Result Date: 01/18/2023 CLINICAL DATA:  High probability for PE. Tachycardia and chest tightness. EXAM: CT ANGIOGRAPHY CHEST WITH CONTRAST TECHNIQUE: Multidetector CT imaging of the chest was performed using the standard protocol during bolus administration of intravenous contrast. Multiplanar CT image reconstructions and MIPs were obtained to evaluate the vascular anatomy. RADIATION DOSE REDUCTION: This exam was performed according to the departmental dose-optimization program which includes automated exposure control, adjustment of the mA and/or kV according to patient size and/or use of iterative reconstruction technique. CONTRAST:  75mL OMNIPAQUE IOHEXOL 350 MG/ML SOLN COMPARISON:  None Available. FINDINGS: Cardiovascular: Satisfactory opacification of the pulmonary arteries to the segmental level. No evidence of pulmonary embolism. Normal heart size. No pericardial effusion. Mediastinum/Nodes: No enlarged mediastinal, hilar, or axillary lymph nodes. Thyroid gland, trachea, and esophagus demonstrate no significant findings. Lungs/Pleura: There some multifocal patchy ground-glass and airspace opacities in both lower lobes. There are minimal ground-glass opacities and patchy airspace opacities in the right middle lobe and inferior left upper lobe. There is no pleural effusion or pneumothorax. Upper Abdomen: No acute abnormality. Cholecystectomy  clips are present. Musculoskeletal: No chest wall abnormality. No acute osseous findings. Cervical spinal fusion plate is partially visualized. Review of the MIP images confirms the above findings. IMPRESSION: 1. No evidence for pulmonary embolism. 2. Multifocal patchy ground-glass and airspace opacities in the lower lobes, right middle lobe and inferior left  upper lobe. Findings are worrisome with multifocal pneumonia. Follow-up imaging recommended to confirm resolution. Electronically Signed   By: Darliss Cheney M.D.   On: 01/18/2023 19:41      Assessment/Plan   Principal Problem:   CAP (community acquired pneumonia) Active Problems:   Bipolar 1 disorder (HCC)   Sepsis (HCC)   Atypical chest pain   Sinus tachycardia   GAD (generalized anxiety disorder)   GERD (gastroesophageal reflux disease)   Mild intermittent asthma   Essential hypertension      #) Sepsis due to community-acquired pneumonia: Diagnosis on the basis of 2 3 days of progressive shortness of breath associated with bilateral pleuritic chest discomfort, with CTA chest showing evidence of multifocal airspace opacities in bilateral lower lobes, right middle lobe, left upper lobe consistent with multifocal pneumonia.  SIRS criteria met via leukocytosis, tachypnea, tachycardia.  Stat lactic acid level ordered, with result currently pending.  Of note, in the absence of any evidence of end organ damage, pt's sepsis does not meet criteria to be considered severe in nature. Also, in the absence of LA level greater than or equal to 4.0, and in the absence of any associated hypotension refractory to IVF's, there are no indications for administration of a 30 mL/kg IVF bolus at this time.  Blood cultures have been ordered, and the patient has received Levaquin, which will is provided to will coverage, noting that she has a reported history of anaphylaxis to penicillin class antibiotics.  No e/o additional infectious process at this time,  will also check urinalysis to further assess.  Of note, in the setting of the multifocal appearance of her pneumonia will noting recent nausea,, will also add on Legionella urine antigen and assess for additional sources of atypical pneumonia, including checking mycoplasma antibodies.  Plan: CBC w/ diff and CMP in AM. Follow for results of blood cx's x 2. Abx: Continue Levaquin.  Add on procalcitonin level.  Lactated Ringer's x 1 L bolus followed by continuous lactated Ringer's at 125 cc/h.  Stat lactic acid level.  Incentive spirometry.  Flutter valve.  Check Legionella urine antigen, strep pneumonia urine antigen, mycoplasma antibodies, as above.  Prn acetaminophen for fever.  Monitor on telemetry.  Prn Cefanex nebulizer.  Check urinalysis.              #) Atypical chest pain: 2 daily sedative bilateral anterior chest wall discomfort, worse with deep inspiration or cough,.  Pleuritic in nature, and in anatomical distribution consistent with multifocal pneumonia, appearing to be the representative cause of this of atypical chest discomfort.  Nonexertional.  Appears atypical for ACS, including no evidence of typical features, while EKG shows no evidence of acute ischemic changes, Cleen evidence of ST elevation, while high sensitive troponin x 2 are nonelevated and trending down, particular reassuring given the duration of her symptoms.  Of note, CTA chest aside from multifocal pneumonia, otherwise shows no evidence of acute process, including evidence of acute pulmonary embolism or any evidence of pneumothorax or any evidence of aortic dissection/aneurysm.  Given the atypical/pleuritic nature of her chest discomfort, will pursue prn Toradol.  Per chart review, no overt history of allergic reaction to NSAIDs.  Plan: Further evaluation and management of presenting multifocal pneumonia, as above.  Prn Toradol.  Incentive [place].  Continue home Pepcid.  Repeat CBC in the morning.  Prn  acetaminophen.                  #) Sinus tachycardia: In the context  of an element of chronicity to her sinus tachycardia, for which she follows with [facility] cardiology as an outpatient, she is noted to be in sinus tachycardia this evening, with heart rates in the 120s.  Unclear nature of her chronic sinus tachycardia.  Differential includes the potential loss of vagal tone given a history of anterior cervical decompression in 2014.  Cardiology's plan as an outpatient was to convert her metoprolol to tartrate to equivalent metoprolol succinate, although she has not yet been able to start this medication as an outpatient, as above.  Will initiate this beta-blocker plan at this time.  In terms of evaluating for any acute injury factors to her chronic sinus tachycardia, differential includes contribution from sepsis in the setting of commune acquired pneumonia, versus pharmacologic impacts of recent increase use of prn albuterol as an outpatient, in addition to potential pharmacologic contribution from the anticholinergic impacts of as needed hydroxyzine for allergies as an outpatient.  Urinary drug screens been ordered, Therazole currently pending.  Of note, TSH found to be within normal limits this evening.  Plan: Further evaluation management of presenting sepsis, as above.  Monitor on telemetry.  Metoprolol succinate 50 mg p.o. daily, per outpatient cardiology, with first dose now.  Add on serum magnesium level.  Repeat CMP in the morning.  Follow result urinary drug screen.  For now, will change prn albuterol to as needed Xopenex nebulizer.  Hold home as needed hydroxyzine.  Follow-up result urinalysis.             #) Generalized anxiety disorder: Documented history of such, with outpatient medications including prn Valium as well as as needed hydroxyzine.  Will hold outpatient prn hydroxyzine for now in the context of sinus tachycardia and potential pharmacologic contributions  from the anticholinergic impacts of this medication.  Plan: Continue outpatient prn Valium.  Hold outpatient prn and hydroxyzine for now, as above.                #) Bipolar disorder: Documented history of such, on Lamictal as an outpatient.  Unknown: Continue Lamictal .                 #) GERD: documented h/o such; on famotidine as outpatient.   Plan: continue home famotidine.                  #) Mild intermittent asthma: Documented history identified, per chart review, on prn albuterol as well as Singulair as an outpatient.  No clinical evidence to suggest acute exacerbation thereof at this time, although she is at risk of ensuing development of such in the context of presenting sepsis due to multifocal pneumonia.  Plan: Change prn albuterol to as needed Xopenex for now in the setting of sinus tachycardia, as above.  Continue outpatient Singulair.  Add on serum magnesium level.                 #) Essential Hypertension: documented h/o such, with outpatient antihypertensive regimen including lisinopril.  SBP's in the ED today: Low 100s mmHg. in the context of presenting sepsis, will hold home lisinopril for now.  Plan: Close monitoring of subsequent BP via routine VS. hold him several for now, as above.        DVT prophylaxis: SCD's   Code Status: Full code Family Communication: none Disposition Plan: Per Rounding Team Consults called: none;  Admission status: observation     I SPENT GREATER THAN 75  MINUTES IN CLINICAL CARE TIME/MEDICAL DECISION-MAKING IN  COMPLETING THIS ADMISSION.      Chaney Born Loriel Diehl DO [facility]  From 7PM - 7AM   01/18/2023, 9:31 PM

## 2023-01-18 NOTE — ED Notes (Signed)
ED TO INPATIENT HANDOFF REPORT  ED Nurse Name and Phone #:   S Name/Age/Gender Shannon Spears 42 y.o. female Room/Bed: 002C/002C  Code Status   Code Status: Full Code  Home/SNF/Other Home Patient oriented to: self, Time, and place Is this baseline? Yes   Triage Complete: Triage complete  Chief Complaint CAP (community acquired pneumonia) [J18.9]  Triage Note Patient BIB EMS from home with c/o headache with dizziness, N/V. No LOC. 400 IVF/ 20gRAC 150/80,108. CBG 104   Allergies Allergies  Allergen Reactions   Buprenorphine Shortness Of Breath and Rash    Breathing problems   Penicillins Anaphylaxis, Nausea And Vomiting and Swelling    TOLERATES ANCEF   Dermatitis Antigen     Other reaction(s): Dermatitis  Tape   Gabapentin Nausea And Vomiting   Nsaids     Renal insufficiency   Propoxyphene Nausea Only    darvocet   Tetanus Toxoids     Blister on legs   Adhesive [Tape] Dermatitis   Codeine     Mouth went numb   Ibuprofen     Has kidney disease.   Doxycycline Rash    Level of Care/Admitting Diagnosis ED Disposition     ED Disposition  Admit   Condition  --   Comment  Hospital Area: [facility] [[id number]]  Level of Care: Progressive [102]  Admit to Progressive based on following criteria: MULTISYSTEM THREATS such as stable sepsis, metabolic/electrolyte imbalance with or without encephalopathy that is responding to early treatment.  May place patient in observation at [facility] or [facility] if equivalent level of care is available:: No  Covid Evaluation: Asymptomatic - no recent exposure (last 10 days) testing not required  Diagnosis: CAP (community acquired pneumonia) [161096]  Admitting Physician: Angie Fava [[id number]]  Attending Physician: Angie Fava [[id number]]          B Medical/Surgery History Past Medical History:  Diagnosis Date   Anxiety    Arthritis    Bipolar 1 disorder, manic, mild (HCC)    CKD (chronic  kidney disease)    Depression    Fibromyalgia    GERD (gastroesophageal reflux disease)    Headache(784.0)    Palpitations    Past Surgical History:  Procedure Laterality Date   ANTERIOR CERVICAL DECOMP/DISCECTOMY FUSION N/A 10/02/2012   Procedure: C4-5, C5-6 anterior cervical discectomy and fusion, allograft plate;  Surgeon: Eldred Manges, MD;  Location: [facility] OR;  Service: Orthopedics;  Laterality: N/A;   CHOLECYSTECTOMY     CYSTOGRAPHY     HIP PINNING,CANNULATED Right 11/22/2022   Procedure: RIGHT HIP PINNING;  Surgeon: Tarry Kos, MD;  Location: [facility] OR;  Service: Orthopedics;  Laterality: Right;   LAPAROSCOPIC TOTAL HYSTERECTOMY     OOPHORECTOMY     TUBAL LIGATION       A IV Location/Drains/Wounds Patient Lines/Drains/Airways Status     Active Line/Drains/Airways     Name Placement date Placement time Site Days   Peripheral IV 01/18/23 20 G Anterior;Left Forearm 01/18/23  1731  Forearm  less than 1            Intake/Output Last 24 hours No intake or output data in the 24 hours ending 01/18/23 2105  Labs/Imaging Results for orders placed or performed during the hospital encounter of 01/18/23 (from the past 48 hour(s))  CBG monitoring, ED     Status: Abnormal   Collection Time: 01/18/23  4:50 PM  Result Value Ref Range   Glucose-Capillary 101 (H) 70 -  99 mg/dL    Comment: Glucose reference range applies only to samples taken after fasting for at least 8 hours.  Basic metabolic panel     Status: Abnormal   Collection Time: 01/18/23  5:08 PM  Result Value Ref Range   Sodium 136 135 - 145 mmol/L   Potassium 3.8 3.5 - 5.1 mmol/L   Chloride 103 98 - 111 mmol/L   CO2 23 22 - 32 mmol/L   Glucose, Bld 97 70 - 99 mg/dL    Comment: Glucose reference range applies only to samples taken after fasting for at least 8 hours.   BUN 6 6 - 20 mg/dL   Creatinine, Ser 3.08 0.44 - 1.00 mg/dL   Calcium 8.6 (L) 8.9 - 10.3 mg/dL   GFR, Estimated >65 >78 mL/min    Comment:  (NOTE) Calculated using the CKD-EPI Creatinine Equation (2021)    Anion gap 10 5 - 15    Comment: Performed at [facility], [address] [place], [place] [address]  CBC with Differential     Status: Abnormal   Collection Time: 01/18/23  5:08 PM  Result Value Ref Range   WBC 12.1 (H) 4.0 - 10.5 K/uL   RBC 4.54 3.87 - 5.11 MIL/uL   Hemoglobin 12.4 12.0 - 15.0 g/dL   HCT 95.2 84.1 - 32.4 %   MCV 84.4 80.0 - 100.0 fL   MCH 27.3 26.0 - 34.0 pg   MCHC 32.4 30.0 - 36.0 g/dL   RDW 40.1 02.7 - 25.3 %   Platelets 494 (H) 150 - 400 K/uL   nRBC 0.0 0.0 - 0.2 %   Neutrophils Relative % 60 %   Neutro Abs 7.3 1.7 - 7.7 K/uL   Lymphocytes Relative 32 %   Lymphs Abs 3.8 0.7 - 4.0 K/uL   Monocytes Relative 5 %   Monocytes Absolute 0.6 0.1 - 1.0 K/uL   Eosinophils Relative 2 %   Eosinophils Absolute 0.3 0.0 - 0.5 K/uL   Basophils Relative 1 %   Basophils Absolute 0.1 0.0 - 0.1 K/uL   Immature Granulocytes 0 %   Abs Immature Granulocytes 0.04 0.00 - 0.07 K/uL    Comment: Performed at [facility], [address], [place], [place] [address]  TSH     Status: None   Collection Time: 01/18/23  5:08 PM  Result Value Ref Range   TSH 2.000 0.350 - 4.500 uIU/mL    Comment: Performed by a 3rd Generation assay with a functional sensitivity of <=0.01 uIU/mL. Performed at [facility], [address], [place], [place] [address]   D-dimer, quantitative     Status: None   Collection Time: 01/18/23  5:08 PM  Result Value Ref Range   D-Dimer, Quant 0.42 0.00 - 0.50 ug/mL-FEU    Comment: (NOTE) At the manufacturer cut-off value of 0.5 g/mL FEU, this assay has a negative predictive value of 95-100%.This assay is intended for use in conjunction with a clinical pretest probability (PTP) assessment model to exclude pulmonary embolism (PE) and deep venous thrombosis (DVT) in outpatients suspected of PE or DVT. Results should be correlated with clinical presentation. Performed at [facility], [address] [place], [place] [address]   Troponin I (High Sensitivity)     Status: None   Collection Time: 01/18/23  5:08 PM  Result Value Ref Range   Troponin I (High Sensitivity) 6 <18 ng/L    Comment: (NOTE) Elevated high sensitivity troponin  I (hsTnI) values and significant  changes across serial measurements may suggest ACS but many other  chronic and acute conditions are known to elevate hsTnI results.  Refer to the "Links" section for chest pain algorithms and additional  guidance. Performed at [facility], [address], [place], [place] [address]   I-Stat beta hCG blood, ED ([facility], [facility], [facility] only)     Status: Abnormal   Collection Time: 01/18/23  5:27 PM  Result Value Ref Range   I-stat hCG, quantitative 11.3 (H) <5 mIU/mL   Comment 3            Comment:   GEST. AGE      CONC.  (mIU/mL)   <=1 WEEK        5 - 50     2 WEEKS       50 - 500     3 WEEKS       100 - 10,000     4 WEEKS     1,000 - 30,000        FEMALE AND NON-PREGNANT FEMALE:     LESS THAN 5 mIU/mL   Troponin I (High Sensitivity)     Status: None   Collection Time: 01/18/23  7:13 PM  Result Value Ref Range   Troponin I (High Sensitivity) 4 <18 ng/L    Comment: (NOTE) Elevated high sensitivity troponin I (hsTnI) values and significant  changes across serial measurements may suggest ACS but many other  chronic and acute conditions are known to elevate hsTnI results.  Refer to the "Links" section for chest pain algorithms and additional  guidance. Performed at [facility], [address] [place], [place] [address]    CT Angio Chest PE W and/or Wo Contrast  Result Date: 01/18/2023 CLINICAL DATA:  High probability for PE. Tachycardia and chest tightness. EXAM: CT ANGIOGRAPHY CHEST WITH CONTRAST TECHNIQUE: Multidetector CT imaging of the chest was performed using the standard protocol during bolus administration of intravenous contrast. Multiplanar CT image reconstructions and MIPs  were obtained to evaluate the vascular anatomy. RADIATION DOSE REDUCTION: This exam was performed according to the departmental dose-optimization program which includes automated exposure control, adjustment of the mA and/or kV according to patient size and/or use of iterative reconstruction technique. CONTRAST:  75mL OMNIPAQUE IOHEXOL 350 MG/ML SOLN COMPARISON:  None Available. FINDINGS: Cardiovascular: Satisfactory opacification of the pulmonary arteries to the segmental level. No evidence of pulmonary embolism. Normal heart size. No pericardial effusion. Mediastinum/Nodes: No enlarged mediastinal, hilar, or axillary lymph nodes. Thyroid gland, trachea, and esophagus demonstrate no significant findings. Lungs/Pleura: There some multifocal patchy ground-glass and airspace opacities in both lower lobes. There are minimal ground-glass opacities and patchy airspace opacities in the right middle lobe and inferior left upper lobe. There is no pleural effusion or pneumothorax. Upper Abdomen: No acute abnormality. Cholecystectomy clips are present. Musculoskeletal: No chest wall abnormality. No acute osseous findings. Cervical spinal fusion plate is partially visualized. Review of the MIP images confirms the above findings. IMPRESSION: 1. No evidence for pulmonary embolism. 2. Multifocal patchy ground-glass and airspace opacities in the lower lobes, right middle lobe and inferior left upper lobe. Findings are worrisome with multifocal pneumonia. Follow-up imaging recommended to confirm resolution. Electronically Signed   By: Darliss Cheney M.D.   On: 01/18/2023 19:41    Pending Labs Unresulted Labs (From admission, onward)     Start     Ordered   01/19/23 0500  CBC with Differential/Platelet  Tomorrow morning,   R        01/18/23 2051   01/19/23 0500  Comprehensive metabolic panel  Tomorrow morning,   R        01/18/23 2051   01/19/23 0500  Magnesium  Tomorrow morning,   R        01/18/23 2051   01/18/23 2055   Lactic acid, plasma  STAT Now then every 3 hours,   R (with STAT occurrences)      01/18/23 2054   01/18/23 2052  Magnesium  Add-on,   AD        01/18/23 2051   01/18/23 2007  Urine rapid drug screen (hosp performed)  Once,   STAT        01/18/23 2006            Vitals/Pain Today's Vitals   01/18/23 2015 01/18/23 2030 01/18/23 2047 01/18/23 2053  BP: (!) 106/58 101/64    Pulse: (!) 123 (!) 123    Resp:  19    Temp:   98 F (36.7 C)   TempSrc:      SpO2: 100% 97%    Weight:    72.6 kg    Isolation Precautions No active isolations  Medications Medications  nitroGLYCERIN (NITROSTAT) SL tablet 0.4 mg (has no administration in time range)  levofloxacin (LEVAQUIN) IVPB 750 mg (750 mg Intravenous New Bag/Given 01/18/23 2021)  acetaminophen (TYLENOL) tablet 650 mg (has no administration in time range)    Or  acetaminophen (TYLENOL) suppository 650 mg (has no administration in time range)  melatonin tablet 3 mg (has no administration in time range)  ondansetron (ZOFRAN) injection 4 mg (has no administration in time range)  levofloxacin (LEVAQUIN) IVPB 750 mg (has no administration in time range)  lactated ringers bolus 1,000 mL (has no administration in time range)  lactated ringers infusion (has no administration in time range)  metoCLOPramide (REGLAN) injection 10 mg (10 mg Intravenous Given 01/18/23 1731)  sodium chloride 0.9 % bolus 1,000 mL (0 mLs Intravenous Stopped 01/18/23 1819)  LORazepam (ATIVAN) injection 1 mg (1 mg Intravenous Given 01/18/23 1841)  iohexol (OMNIPAQUE) 350 MG/ML injection 75 mL (75 mLs Intravenous Contrast Given 01/18/23 1929)  acetaminophen (TYLENOL) tablet 650 mg (650 mg Oral Given 01/18/23 2015)  prochlorperazine (COMPAZINE) injection 10 mg (10 mg Intravenous Given 01/18/23 2016)  diphenhydrAMINE (BENADRYL) injection 25 mg (25 mg Intravenous Given 01/18/23 2015)    Mobility walks     Focused Assessments Cardiac Assessment Handoff:    No results  found for: "CKTOTAL", "CKMB", "CKMBINDEX", "TROPONINI" Lab Results  Component Value Date   DDIMER 0.42 01/18/2023   Does the Patient currently have chest pain? No    R Recommendations: See Admitting Provider Note  Report given to:   Additional Notes:

## 2023-01-18 NOTE — ED Provider Notes (Signed)
[place] EMERGENCY DEPARTMENT AT [facility] Provider Note   CSN: [id number] Arrival date & time: 01/18/23  1643     History  Chief Complaint  Patient presents with   Migraine   Nausea    Shannon Spears is a 42 y.o. female.  HPI    42 year old female comes in with chief complaint of elevated heart rate, nausea, headaches.  Patient was seen in the hospital about 2 weeks ago for tachycardia.  At that time she was having nausea, vomiting.  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  She has not quite felt back to normal in the last several months.  However, over the last few days she has noted that her heart rate is racing despite taking her medications.  She was complaining laundry when she started noticing that her heart was pounding and she got dizzy, weak and felt like she might faint.  She also then started having nausea and headaches.  Patient states that she will have intermittent outburst of tachycardia, and when they occur she will also get migraine type headaches.  Her headaches are frontal, sharp and there is associated nausea and photophobia.  Headaches are her previous headaches.  Patient also has chest tightness that is described as something sitting on her.  That pain is worse with deep inspiration.  She does not have profound cough.  Subjective fevers only.  Patient also has history of hip replacement that was done about 2 months ago.  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  Home Medications Prior to Admission medications   Medication Sig Start Date End Date Taking? Authorizing Provider  albuterol (PROVENTIL) (2.5 MG/3ML) 0.083% nebulizer solution Take 3 mLs (2.5 mg total) by nebulization every 6 (six) hours as needed for wheezing or shortness of breath. 02/01/19   Bing Neighbors, NP  albuterol (VENTOLIN HFA) 108 (90 Base) MCG/ACT inhaler Inhale 2 puffs into the lungs  every 6 (six) hours as needed for wheezing or shortness of breath. 02/01/19   Bing Neighbors, NP  CALCIUM-VITAMIN D PO Take 1 tablet by mouth daily.    [provider]  cefUROXime (CEFTIN) 500 MG tablet Take 1 tablet (500 mg total) by mouth 2 (two) times daily with a meal. 08/12/19   Tilden Fossa, MD  cetirizine (ZYRTEC) 10 MG tablet Take 10 mg by mouth daily.    [provider]  cyclobenzaprine (FLEXERIL) 10 MG tablet Take 1 tablet (10 mg total) by mouth 3 (three) times daily as needed for muscle spasms. 06/21/19   Hoy Register, MD  diazepam (VALIUM) 10 MG tablet Take 1 tablet (10 mg total) by mouth 3 (three) times daily as needed for anxiety. 01/08/23   Mozingo, Thereasa Solo, NP  dicyclomine (BENTYL) 20 MG tablet Take 1 tablet (20 mg total) by mouth 2 (two) times daily. 01/03/23   Gailen Shelter, PA  famotidine (PEPCID AC MAXIMUM STRENGTH) 20 MG tablet Take 20 mg by mouth daily.    [provider]  HYDROcodone-acetaminophen (NORCO) 5-325 MG tablet Take 1 tablet by mouth every 6 (six) hours as needed. 11/22/22   Tarry Kos, MD  HYDROcodone-acetaminophen (NORCO) 5-325 MG tablet Take 1 tablet by mouth 2 (two) times daily as needed. 12/24/22   Cristie Hem, PA-C  hydrOXYzine (ATARAX) 25 MG tablet TAKE 1 TABLET BY MOUTH UP TO 6 TIMES PERDAY AS NEEDED FOR ANXIETY, ITCHING, AND INSOMNIA 01/08/23  Mozingo, Thereasa Solo, NP  Lactobacillus (PROBIOTIC ACIDOPHILUS PO) Take 1 capsule by mouth daily.    [provider]  lamoTRIgine (LAMICTAL) 200 MG tablet Take 1 tablet (200 mg total) by mouth at bedtime. 09/19/22   Mozingo, Thereasa Solo, NP  levETIRAcetam (KEPPRA) 500 MG tablet Take 1,000 mg by mouth 2 (two) times daily.    [provider]  lisinopril (ZESTRIL) 2.5 MG tablet Take 2.5 mg by mouth at bedtime.    [provider]  methocarbamol (ROBAXIN-750) 750 MG tablet Take 1 tablet (750 mg total) by mouth 2 (two) times daily as needed for  muscle spasms. 12/13/22   Cristie Hem, PA-C  metoprolol tartrate (LOPRESSOR) 25 MG tablet Take 25 mg by mouth 2 (two) times daily.    [provider]  montelukast (SINGULAIR) 10 MG tablet Take 10 mg by mouth at bedtime.    [provider]  ondansetron (ZOFRAN) 4 MG tablet Take 1-2 tablets (4-8 mg total) by mouth every 8 (eight) hours as needed for nausea or vomiting. 11/22/22   Tarry Kos, MD  ondansetron (ZOFRAN-ODT) 4 MG disintegrating tablet Take 1 tablet (4 mg total) by mouth every 8 (eight) hours as needed for nausea or vomiting. 01/03/23   Gailen Shelter, PA  traMADol (ULTRAM) 50 MG tablet Take 1-2 tablets (50-100 mg total) by mouth daily as needed. 12/31/22   Tarry Kos, MD      Allergies    Buprenorphine, Penicillins, Dermatitis antigen, Gabapentin, Nsaids, Propoxyphene, Tetanus toxoids, Adhesive [tape], Codeine, Ibuprofen, and Doxycycline    Review of Systems   Review of Systems  All other systems reviewed and are negative.   Physical Exam Updated Vital Signs BP (!) 118/55   Pulse (!) 127   Temp 98.2 F (36.8 C) (Oral)   Resp (!) 30   LMP 10/02/2012   SpO2 96%  Physical Exam Vitals and nursing note reviewed.  Constitutional:      Appearance: She is well-developed.  HENT:     Head: Normocephalic and atraumatic.     Mouth/Throat:     Mouth: Mucous membranes are moist.  Eyes:     Extraocular Movements: Extraocular movements intact.  Cardiovascular:     Rate and Rhythm: Tachycardia present.  Pulmonary:     Effort: Pulmonary effort is normal.     Breath sounds: No wheezing or rales.  Musculoskeletal:     Cervical back: Normal range of motion and neck supple.     Right lower leg: No edema.     Left lower leg: No edema.  Skin:    General: Skin is dry.  Neurological:     Mental Status: She is alert and oriented to person, place, and time.     ED Results / Procedures / Treatments   Labs (all labs ordered are listed, but only abnormal  results are displayed) Labs Reviewed  BASIC METABOLIC PANEL - Abnormal; Notable for the following components:      Result Value   Calcium 8.6 (*)    All other components within normal limits  CBC WITH DIFFERENTIAL/PLATELET - Abnormal; Notable for the following components:   WBC 12.1 (*)    Platelets 494 (*)    All other components within normal limits  CBG MONITORING, ED - Abnormal; Notable for the following components:   Glucose-Capillary 101 (*)    All other components within normal limits  I-STAT BETA HCG BLOOD, ED ([facility], [facility], [facility] ONLY) - Abnormal; Notable for the following components:  I-stat hCG, quantitative 11.3 (*)    All other components within normal limits  TSH  D-DIMER, QUANTITATIVE  TROPONIN I (HIGH SENSITIVITY)  TROPONIN I (HIGH SENSITIVITY)    EKG EKG Interpretation  Date/Time:  Saturday January 18 2023 16:49:27 EDT Ventricular Rate:  126 PR Interval:  133 QRS Duration: 85 QT Interval:  324 QTC Calculation: 470 R Axis:   -1 Text Interpretation: Sinus tachycardia Low voltage, precordial leads Consider anterior infarct tachycardia noted and new Confirmed by Derwood Kaplan [phone]) on 01/18/2023 5:04:21 PM  Radiology CT Angio Chest PE W and/or Wo Contrast  Result Date: 01/18/2023 CLINICAL DATA:  High probability for PE. Tachycardia and chest tightness. EXAM: CT ANGIOGRAPHY CHEST WITH CONTRAST TECHNIQUE: Multidetector CT imaging of the chest was performed using the standard protocol during bolus administration of intravenous contrast. Multiplanar CT image reconstructions and MIPs were obtained to evaluate the vascular anatomy. RADIATION DOSE REDUCTION: This exam was performed according to the departmental dose-optimization program which includes automated exposure control, adjustment of the mA and/or kV according to patient size and/or use of iterative reconstruction technique. CONTRAST:  75mL OMNIPAQUE IOHEXOL 350 MG/ML SOLN COMPARISON:  None Available. FINDINGS:  Cardiovascular: Satisfactory opacification of the pulmonary arteries to the segmental level. No evidence of pulmonary embolism. Normal heart size. No pericardial effusion. Mediastinum/Nodes: No enlarged mediastinal, hilar, or axillary lymph nodes. Thyroid gland, trachea, and esophagus demonstrate no significant findings. Lungs/Pleura: There some multifocal patchy ground-glass and airspace opacities in both lower lobes. There are minimal ground-glass opacities and patchy airspace opacities in the right middle lobe and inferior left upper lobe. There is no pleural effusion or pneumothorax. Upper Abdomen: No acute abnormality. Cholecystectomy clips are present. Musculoskeletal: No chest wall abnormality. No acute osseous findings. Cervical spinal fusion plate is partially visualized. Review of the MIP images confirms the above findings. IMPRESSION: 1. No evidence for pulmonary embolism. 2. Multifocal patchy ground-glass and airspace opacities in the lower lobes, right middle lobe and inferior left upper lobe. Findings are worrisome with multifocal pneumonia. Follow-up imaging recommended to confirm resolution. Electronically Signed   By: Darliss Cheney M.D.   On: 01/18/2023 19:41    Procedures .Critical Care  Performed by: Derwood Kaplan, MD Authorized by: Derwood Kaplan, MD   Critical care provider statement:    Critical care time (minutes):  36   Critical care was necessary to treat or prevent imminent or life-threatening deterioration of the following conditions:  Circulatory failure (Persistent tachycardia with heart rate over 120)   Critical care was time spent personally by me on the following activities:  Development of treatment plan with patient or surrogate, discussions with consultants, evaluation of patient's response to treatment, examination of patient, ordering and review of laboratory studies, ordering and review of radiographic studies, ordering and performing treatments and interventions,  pulse oximetry, re-evaluation of patient's condition, review of old charts and obtaining history from patient or surrogate     Medications Ordered in ED Medications  nitroGLYCERIN (NITROSTAT) SL tablet 0.4 mg (has no administration in time range)  acetaminophen (TYLENOL) tablet 650 mg (has no administration in time range)  levofloxacin (LEVAQUIN) IVPB 750 mg (has no administration in time range)  prochlorperazine (COMPAZINE) injection 10 mg (has no administration in time range)  diphenhydrAMINE (BENADRYL) injection 25 mg (has no administration in time range)  metoCLOPramide (REGLAN) injection 10 mg (10 mg Intravenous Given 01/18/23 1731)  sodium chloride 0.9 % bolus 1,000 mL (0 mLs Intravenous Stopped 01/18/23 1819)  LORazepam (ATIVAN) injection 1 mg (  1 mg Intravenous Given 01/18/23 1841)  iohexol (OMNIPAQUE) 350 MG/ML injection 75 mL (75 mLs Intravenous Contrast Given 01/18/23 1929)    ED Course/ Medical Decision Making/ A&P                             Medical Decision Making Amount and/or Complexity of Data Reviewed Labs: ordered. Radiology: ordered.  Risk OTC drugs. Prescription drug management. Decision regarding hospitalization.  This patient presents to the ED with chief complaint(s) of tachycardia, dizziness, headaches, nausea, shortness of breath with pertinent past medical history of tachycardia/PAC and associated headaches.  Patient noted to be tachycardic in the ER with heart rate of over 120.  She had a hip replacement done in April of this year. The complaint involves an extensive differential diagnosis and also carries with it a high risk of complications and morbidity.    The differential diagnosis includes : Pulmonary embolism, hyperthyroidism, toxin mediated tachycardia, drug withdrawals, severe anemia.   I have reviewed patient's previous records including notes from cardiology at [facility].  They have changed her metoprolol to long-acting metoprolol.  Have also  reviewed the op note from Dr. Deno Etienne.  I have reviewed patient's medications, she is on diazepam, tramadol and hydrocodone.  The initial plan is to get basic labs, troponin and reassess the patient.   Additional history obtained: Records reviewed Care Everywhere/External Records  Independent labs interpretation:  The following labs were independently interpreted: CBC shows no anemia, troponin is normal.  Independent visualization and interpretation of imaging: - I independently visualized the following imaging with scope of interpretation limited to determining acute life threatening conditions related to emergency care: , which revealed CT PE, which is negative for any evidence of saddle pulmonary embolism  Treatment and Reassessment: Patient reassessed after CT.  She indicates there is some pleuritic component to her chest pain.  No cough.  Chest pain is persistent.  She still has headache.  She remains tachycardic, we gave her Ativan to see if that would help, but the heart rate has not come down.  It is not advisable to discharge patient with heart rate over 120.  Clinically, suspicion for CAP was low, but with the CT PE finding we will give her antibiotics.   Patient is clinically not septic. Troponins are still reassuring.  Doubt endocarditis. Final Clinical Impression(s) / ED Diagnoses Final diagnoses:  Tachycardia  Community acquired pneumonia, unspecified laterality    Rx / DC Orders ED Discharge Orders     None         Derwood Kaplan, MD 01/18/23 2006

## 2023-01-19 DIAGNOSIS — J189 Pneumonia, unspecified organism: Secondary | ICD-10-CM | POA: Diagnosis not present

## 2023-01-19 LAB — COMPREHENSIVE METABOLIC PANEL
ALT: 31 U/L (ref 0–44)
AST: 22 U/L (ref 15–41)
Albumin: 2.5 g/dL — ABNORMAL LOW (ref 3.5–5.0)
Alkaline Phosphatase: 129 U/L — ABNORMAL HIGH (ref 38–126)
Anion gap: 8 (ref 5–15)
BUN: 5 mg/dL — ABNORMAL LOW (ref 6–20)
CO2: 22 mmol/L (ref 22–32)
Calcium: 8.2 mg/dL — ABNORMAL LOW (ref 8.9–10.3)
Chloride: 106 mmol/L (ref 98–111)
Creatinine, Ser: 0.79 mg/dL (ref 0.44–1.00)
GFR, Estimated: >60 mL/min (ref >60)
Glucose, Bld: 102 mg/dL — ABNORMAL HIGH (ref 70–99)
Potassium: 3.7 mmol/L (ref 3.5–5.1)
Sodium: 136 mmol/L (ref 135–145)
Total Bilirubin: 0.4 mg/dL (ref 0.3–1.2)
Total Protein: 6 g/dL — ABNORMAL LOW (ref 6.5–8.1)

## 2023-01-19 LAB — CBC WITH DIFFERENTIAL/PLATELET
Abs Immature Granulocytes: 0.05 10*3/uL (ref 0.00–0.07)
Basophils Absolute: 0.1 10*3/uL (ref 0.0–0.1)
Basophils Relative: 1 %
Eosinophils Absolute: 0.2 10*3/uL (ref 0.0–0.5)
Eosinophils Relative: 2 %
HCT: 32.2 % — ABNORMAL LOW (ref 36.0–46.0)
Hemoglobin: 10.5 g/dL — ABNORMAL LOW (ref 12.0–15.0)
Immature Granulocytes: 1 %
Lymphocytes Relative: 34 %
Lymphs Abs: 3.3 10*3/uL (ref 0.7–4.0)
MCH: 28.1 pg (ref 26.0–34.0)
MCHC: 32.6 g/dL (ref 30.0–36.0)
MCV: 86.1 fL (ref 80.0–100.0)
Monocytes Absolute: 0.6 10*3/uL (ref 0.1–1.0)
Monocytes Relative: 6 %
Neutro Abs: 5.6 10*3/uL (ref 1.7–7.7)
Neutrophils Relative %: 56 %
Platelets: 428 10*3/uL — ABNORMAL HIGH (ref 150–400)
RBC: 3.74 MIL/uL — ABNORMAL LOW (ref 3.87–5.11)
RDW: 14.2 % (ref 11.5–15.5)
WBC: 9.8 10*3/uL (ref 4.0–10.5)
nRBC: 0 % (ref 0.0–0.2)

## 2023-01-19 LAB — LACTIC ACID, PLASMA
Lactic Acid, Venous: 1.5 mmol/L (ref 0.5–1.9)
Lactic Acid, Venous: 1.6 mmol/L (ref 0.5–1.9)

## 2023-01-19 LAB — URINALYSIS, COMPLETE (UACMP) WITH MICROSCOPIC
Bilirubin Urine: NEGATIVE
Glucose, UA: NEGATIVE mg/dL
Ketones, ur: NEGATIVE mg/dL
Leukocytes,Ua: NEGATIVE
Nitrite: NEGATIVE
Protein, ur: NEGATIVE mg/dL
Specific Gravity, Urine: 1.019 (ref 1.005–1.030)
pH: 5 (ref 5.0–8.0)

## 2023-01-19 LAB — RAPID URINE DRUG SCREEN, HOSP PERFORMED
Amphetamines: NOT DETECTED
Barbiturates: NOT DETECTED
Benzodiazepines: POSITIVE — AB
Cocaine: NOT DETECTED
Opiates: NOT DETECTED
Tetrahydrocannabinol: POSITIVE — AB

## 2023-01-19 LAB — PROCALCITONIN: Procalcitonin: <0.1 ng/mL

## 2023-01-19 LAB — HCG, QUANTITATIVE, PREGNANCY: hCG, Beta Chain, Quant, S: 4 m[IU]/mL (ref <5)

## 2023-01-19 LAB — STREP PNEUMONIAE URINARY ANTIGEN: Strep Pneumo Urinary Antigen: NEGATIVE

## 2023-01-19 LAB — MAGNESIUM: Magnesium: 1.7 mg/dL (ref 1.7–2.4)

## 2023-01-19 LAB — CULTURE, BLOOD (ROUTINE X 2)

## 2023-01-19 MED ORDER — CEFUROXIME AXETIL 500 MG PO TABS: 500.0000 mg | ORAL_TABLET | Freq: Two times a day (BID) | ORAL | 0 refills | Status: DC

## 2023-01-19 MED ORDER — SODIUM CHLORIDE 0.9 % IV SOLN
2.0000 g | INTRAVENOUS | Status: DC
  Administered 2023-01-19: 2 g via INTRAVENOUS
  Filled 2023-01-19: qty 20

## 2023-01-19 MED ORDER — AZITHROMYCIN 500 MG PO TABS
500.0000 mg | ORAL_TABLET | Freq: Every day | ORAL | Status: DC
  Administered 2023-01-19: 500 mg via ORAL
  Filled 2023-01-19: qty 1

## 2023-01-19 MED ORDER — AZITHROMYCIN 500 MG PO TABS: 500.0000 mg | ORAL_TABLET | Freq: Every day | ORAL | 0 refills | Status: DC

## 2023-01-19 NOTE — Discharge Summary (Signed)
Physician Discharge Summary  Shannon Spears ZOX:[id number] DOB: 1981/07/04 DOA: 01/18/2023  PCP: Default, Provider, MD  Admit date: 01/18/2023 Discharge date: 01/19/2023  Admitted From: (Home) Disposition:  (Home )  Recommendations for Outpatient Follow-up:  Follow up with PCP in 1-2 weeks   Discharge Condition: (Stable) CODE STATUS: (FULL) Diet recommendation: Regular  Brief/Interim Summary:  Shannon Spears is a 42 y.o. female with medical history significant for mild intermittent asthma, essential pretension, bipolar disorder, generalized anxiety disorder, GERD, who is admitted to [facility] on 01/18/2023 with sepsis due to community-acquired pneumonia after presenting from home to [facility] ED complaining of shortness of breath.  Her CTA chest was significant for pneumonia, she was admitted for further workup.  Discharge Diagnoses:  Principal Problem:   CAP (community acquired pneumonia) Active Problems:   Bipolar 1 disorder (HCC)   Atypical chest pain   Sinus tachycardia   GAD (generalized anxiety disorder)   GERD (gastroesophageal reflux disease)   Mild intermittent asthma   Essential hypertension  Community-acquired pneumonia Sepsis ruled out -CT chest significant for multifocal pneumonia, she was treated with IV Rocephin and azithromycin during hospital stay, she will be discharged on Ceftin and azithromycin x 4 days.  Atypical chest pain related to cough -This is mainly related to cough, ACS ruled out, troponins negative x 2, EKG nonacute, CTA chest negative for PE  Generalized anxiety disorder Bipolar disorder -Continue with home meds  GERD -Continue with home meds  Hypertension -blood pressure remains soft, will discontinue lisinopril  Sinus tachycardia -Continue with home dose beta-blockers   Discharge Instructions  Discharge Instructions     Diet - low sodium heart healthy   Complete by: As directed    Discharge instructions   Complete by: As directed     Follow with Primary MD  in 10 days   Get CBC, CMP, 2 view Chest X ray checked  by Primary MD next visit.    Activity: As tolerated with Full fall precautions use walker/cane & assistance as needed   Disposition Home    Diet: regular  On your next visit with your primary care physician please Get Medicines reviewed and adjusted.   Please request your Prim.MD to go over all Hospital Tests and Procedure/Radiological results at the follow up, please get all Hospital records sent to your Prim MD by signing hospital release before you go home.   If you experience worsening of your admission symptoms, develop shortness of breath, life threatening emergency, suicidal or homicidal thoughts you must seek medical attention immediately by calling 911 or calling your MD immediately  if symptoms less severe.  You Must read complete instructions/literature along with all the possible adverse reactions/side effects for all the Medicines you take and that have been prescribed to you. Take any new Medicines after you have completely understood and accpet all the possible adverse reactions/side effects.   Do not drive, operating heavy machinery, perform activities at heights, swimming or participation in water activities or provide baby sitting services if your were admitted for syncope or siezures until you have seen by Primary MD or a Neurologist and advised to do so again.  Do not drive when taking Pain medications.    Do not take more than prescribed Pain, Sleep and Anxiety Medications  Special Instructions: If you have smoked or chewed Tobacco  in the last 2 yrs please stop smoking, stop any regular Alcohol  and or any Recreational drug use.  Wear Seat belts while driving.   Please  note  You were cared for by a hospitalist during your hospital stay. If you have any questions about your discharge medications or the care you received while you were in the hospital after you are discharged,  you can call the unit and asked to speak with the hospitalist on call if the hospitalist that took care of you is not available. Once you are discharged, your primary care physician will handle any further medical issues. Please note that NO REFILLS for any discharge medications will be authorized once you are discharged, as it is imperative that you return to your primary care physician (or establish a relationship with a primary care physician if you do not have one) for your aftercare needs so that they can reassess your need for medications and monitor your lab values.   Increase activity slowly   Complete by: As directed       Allergies as of 01/19/2023       Reactions   Buprenorphine Shortness Of Breath, Rash   Breathing problems   Penicillins Anaphylaxis, Nausea And Vomiting, Swelling   TOLERATES ANCEF   Dermatitis Antigen    Other reaction(s): Dermatitis  Tape   Gabapentin Nausea And Vomiting   Nsaids    Renal insufficiency   Propoxyphene Nausea Only   darvocet   Tetanus Toxoids    Blister on legs   Adhesive [tape] Dermatitis   Codeine    Mouth went numb   Ibuprofen    Has kidney disease.   Doxycycline Rash        Medication List     STOP taking these medications    dicyclomine 20 MG tablet Commonly known as: BENTYL   HYDROcodone-acetaminophen 5-325 MG tablet Commonly known as: Norco   lisinopril 2.5 MG tablet Commonly known as: ZESTRIL   metoprolol tartrate 25 MG tablet Commonly known as: LOPRESSOR   ondansetron 4 MG tablet Commonly known as: ZOFRAN   traMADol 50 MG tablet Commonly known as: ULTRAM       TAKE these medications    albuterol 108 (90 Base) MCG/ACT inhaler Commonly known as: VENTOLIN HFA Inhale 2 puffs into the lungs every 6 (six) hours as needed for wheezing or shortness of breath.   albuterol (2.5 MG/3ML) 0.083% nebulizer solution Commonly known as: PROVENTIL Take 3 mLs (2.5 mg total) by nebulization every 6 (six) hours as  needed for wheezing or shortness of breath.   azithromycin 500 MG tablet Commonly known as: ZITHROMAX Take 1 tablet (500 mg total) by mouth daily. Start taking on: January 20, 2023   CALCIUM-VITAMIN D PO Take 1 tablet by mouth daily.   cefUROXime 500 MG tablet Commonly known as: CEFTIN Take 1 tablet (500 mg total) by mouth 2 (two) times daily with a meal. Start taking on: January 20, 2023   cyclobenzaprine 10 MG tablet Commonly known as: FLEXERIL Take 1 tablet (10 mg total) by mouth 3 (three) times daily as needed for muscle spasms.   diazepam 10 MG tablet Commonly known as: VALIUM Take 1 tablet (10 mg total) by mouth 3 (three) times daily as needed for anxiety.   hydrOXYzine 25 MG tablet Commonly known as: ATARAX TAKE 1 TABLET BY MOUTH UP TO 6 TIMES PERDAY AS NEEDED FOR ANXIETY, ITCHING, AND INSOMNIA What changed:  how much to take how to take this when to take this   lamoTRIgine 200 MG tablet Commonly known as: LAMICTAL Take 1 tablet (200 mg total) by mouth at bedtime.   levETIRAcetam 500 MG  tablet Commonly known as: KEPPRA Take 1,000 mg by mouth 2 (two) times daily.   methocarbamol 750 MG tablet Commonly known as: Robaxin-750 Take 1 tablet (750 mg total) by mouth 2 (two) times daily as needed for muscle spasms.   metoprolol succinate 25 MG 24 hr tablet Commonly known as: TOPROL-XL Take 25 mg by mouth daily.   montelukast 10 MG tablet Commonly known as: SINGULAIR Take 10 mg by mouth daily.   ondansetron 4 MG disintegrating tablet Commonly known as: ZOFRAN-ODT Take 1 tablet (4 mg total) by mouth every 8 (eight) hours as needed for nausea or vomiting.   Pepcid AC Maximum Strength 20 MG tablet Generic drug: famotidine Take 20 mg by mouth 2 (two) times daily.   PROBIOTIC ACIDOPHILUS PO Take 1 capsule by mouth daily.        Allergies  Allergen Reactions   Buprenorphine Shortness Of Breath and Rash    Breathing problems   Penicillins Anaphylaxis, Nausea And  Vomiting and Swelling    TOLERATES ANCEF   Dermatitis Antigen     Other reaction(s): Dermatitis  Tape   Gabapentin Nausea And Vomiting   Nsaids     Renal insufficiency   Propoxyphene Nausea Only    darvocet   Tetanus Toxoids     Blister on legs   Adhesive [Tape] Dermatitis   Codeine     Mouth went numb   Ibuprofen     Has kidney disease.   Doxycycline Rash    Consultations: none   Procedures/Studies: DG Chest 2 View  Result Date: 01/18/2023 CLINICAL DATA:  Tachycardia EXAM: CHEST - 2 VIEW COMPARISON:  CT from earlier in the same day. FINDINGS: Patchy left basilar atelectasis is noted. No other focal airspace opacity is seen. Heart is within normal limits. Postsurgical changes in the cervical spine are noted. No bony abnormality is seen. IMPRESSION: Left basilar atelectasis. Electronically Signed   By: Alcide Clever M.D.   On: 01/18/2023 21:40   CT Angio Chest PE W and/or Wo Contrast  Result Date: 01/18/2023 CLINICAL DATA:  High probability for PE. Tachycardia and chest tightness. EXAM: CT ANGIOGRAPHY CHEST WITH CONTRAST TECHNIQUE: Multidetector CT imaging of the chest was performed using the standard protocol during bolus administration of intravenous contrast. Multiplanar CT image reconstructions and MIPs were obtained to evaluate the vascular anatomy. RADIATION DOSE REDUCTION: This exam was performed according to the departmental dose-optimization program which includes automated exposure control, adjustment of the mA and/or kV according to patient size and/or use of iterative reconstruction technique. CONTRAST:  75mL OMNIPAQUE IOHEXOL 350 MG/ML SOLN COMPARISON:  None Available. FINDINGS: Cardiovascular: Satisfactory opacification of the pulmonary arteries to the segmental level. No evidence of pulmonary embolism. Normal heart size. No pericardial effusion. Mediastinum/Nodes: No enlarged mediastinal, hilar, or axillary lymph nodes. Thyroid gland, trachea, and esophagus demonstrate no  significant findings. Lungs/Pleura: There some multifocal patchy ground-glass and airspace opacities in both lower lobes. There are minimal ground-glass opacities and patchy airspace opacities in the right middle lobe and inferior left upper lobe. There is no pleural effusion or pneumothorax. Upper Abdomen: No acute abnormality. Cholecystectomy clips are present. Musculoskeletal: No chest wall abnormality. No acute osseous findings. Cervical spinal fusion plate is partially visualized. Review of the MIP images confirms the above findings. IMPRESSION: 1. No evidence for pulmonary embolism. 2. Multifocal patchy ground-glass and airspace opacities in the lower lobes, right middle lobe and inferior left upper lobe. Findings are worrisome with multifocal pneumonia. Follow-up imaging recommended to confirm resolution. Electronically  Signed   By: Darliss Cheney M.D.   On: 01/18/2023 19:41   CT ABDOMEN PELVIS WO CONTRAST  Result Date: 01/03/2023 CLINICAL DATA:  Acute nonlocalized right upper quadrant and epigastric abdominal pain. EXAM: CT ABDOMEN AND PELVIS WITHOUT CONTRAST TECHNIQUE: Multidetector CT imaging of the abdomen and pelvis was performed following the standard protocol without IV contrast. RADIATION DOSE REDUCTION: This exam was performed according to the departmental dose-optimization program which includes automated exposure control, adjustment of the mA and/or kV according to patient size and/or use of iterative reconstruction technique. COMPARISON:  07/18/2017 FINDINGS: Lower chest: No acute abnormality. Hepatobiliary: No focal liver abnormality is seen. Status post cholecystectomy. No biliary dilatation. Pancreas: Unremarkable. No pancreatic ductal dilatation or surrounding inflammatory changes. Spleen: Normal in size without focal abnormality. Adrenals/Urinary Tract: Adrenal glands are unremarkable. Kidneys are normal, without renal calculi, focal lesion, or hydronephrosis. Bladder is unremarkable.  Stomach/Bowel: Stomach is within normal limits. Appendix appears normal. No evidence of bowel wall thickening, distention, or inflammatory changes. Vascular/Lymphatic: No significant vascular findings are present. No enlarged abdominal or pelvic lymph nodes. Reproductive: Status post hysterectomy. No adnexal masses. Other: No abdominal wall hernia or abnormality. No abdominopelvic ascites. Musculoskeletal: Postoperative fixation of the right hip. No acute bony abnormalities. IMPRESSION: No acute process demonstrated in the abdomen or pelvis. No evidence of bowel obstruction or inflammation. Electronically Signed   By: Burman Nieves M.D.   On: 01/03/2023 22:36   XR HIP UNILAT W OR W/O PELVIS 2-3 VIEWS RIGHT  Result Date: 12/24/2022 Stable appearance of cannulated screw fixation of femoral neck.  No abnormalities or complications.     Subjective:  Patient denies any chest pain, no further dyspnea today, reports she is feeling much better, asking if she can go home today.  Discharge Exam: Vitals:   01/19/23 0620 01/19/23 0800  BP: 98/72 96/69  Pulse: (!) 106 97  Resp: 18 17  Temp: 98 F (36.7 C) 98.6 F (37 C)  SpO2: 94% 97%   Vitals:   01/19/23 0233 01/19/23 0500 01/19/23 0620 01/19/23 0800  BP: 102/72  98/72 96/69  Pulse: (!) 119  (!) 106 97  Resp: 17  18 17   Temp: 97.7 F (36.5 C)  98 F (36.7 C) 98.6 F (37 C)  TempSrc: Oral  Oral   SpO2: 96%  94% 97%  Weight:  84.7 kg      General: Pt is alert, awake, not in acute distress Cardiovascular: RRR, S1/S2 +, no rubs, no gallops Respiratory: CTA bilaterally, no wheezing, no rhonchi Abdominal: Soft, NT, ND, bowel sounds + Extremities: no edema, no cyanosis    The results of significant diagnostics from this hospitalization (including imaging, microbiology, ancillary and laboratory) are listed below for reference.     Microbiology: Recent Results (from the past 240 hour(s))  Culture, blood (Routine X 2) w Reflex to ID  Panel     Status: None (Preliminary result)   Collection Time: 01/18/23 10:48 PM   Specimen: BLOOD  Result Value Ref Range Status   Specimen Description BLOOD BLOOD RIGHT ARM  Final   Special Requests   Final    BOTTLES DRAWN AEROBIC AND ANAEROBIC Blood Culture adequate volume   Culture   Final    NO GROWTH < 12 HOURS Performed at [facility] [place] [facility], [address], [place], [place] [address]    Report Status PENDING  Incomplete  Culture, blood (Routine X 2) w Reflex to ID Panel     Status: None (Preliminary  result)   Collection Time: 01/18/23 10:51 PM   Specimen: BLOOD  Result Value Ref Range Status   Specimen Description BLOOD BLOOD RIGHT ARM  Final   Special Requests   Final    BOTTLES DRAWN AEROBIC AND ANAEROBIC Blood Culture adequate volume   Culture   Final    NO GROWTH < 12 HOURS Performed at [facility], [address] [place], [place] [address]    Report Status PENDING  Incomplete     Labs: BNP (last 3 results) No results for input(s): "BNP" in the last 8760 hours. Basic Metabolic Panel: Recent Labs  Lab 01/18/23 1708 01/18/23 2251 01/19/23 0255  NA 136  --  136  K 3.8  --  3.7  CL 103  --  106  CO2 23  --  22  GLUCOSE 97  --  102*  BUN 6  --  5*  CREATININE 0.94  --  0.79  CALCIUM 8.6*  --  8.2*  MG  --  1.9 1.7   Liver Function Tests: Recent Labs  Lab 01/19/23 0255  AST 22  ALT 31  ALKPHOS 129*  BILITOT 0.4  PROT 6.0*  ALBUMIN 2.5*   No results for input(s): "LIPASE", "AMYLASE" in the last 168 hours. No results for input(s): "AMMONIA" in the last 168 hours. CBC: Recent Labs  Lab 01/18/23 1708 01/19/23 0255  WBC 12.1* 9.8  NEUTROABS 7.3 5.6  HGB 12.4 10.5*  HCT 38.3 32.2*  MCV 84.4 86.1  PLT 494* 428*   Cardiac Enzymes: No results for input(s): "CKTOTAL", "CKMB", "CKMBINDEX", "TROPONINI" in the last 168 hours. BNP: Invalid input(s): "POCBNP" CBG: Recent Labs  Lab 01/18/23 1650  GLUCAP 101*   D-Dimer Recent Labs     01/18/23 1708  DDIMER 0.42   Hgb A1c No results for input(s): "HGBA1C" in the last 72 hours. Lipid Profile No results for input(s): "CHOL", "HDL", "LDLCALC", "TRIG", "CHOLHDL", "LDLDIRECT" in the last 72 hours. Thyroid function studies Recent Labs    01/18/23 1708  TSH 2.000   Anemia work up No results for input(s): "VITAMINB12", "FOLATE", "FERRITIN", "TIBC", "IRON", "RETICCTPCT" in the last 72 hours. Urinalysis    Component Value Date/Time   COLORURINE STRAW (A) 01/19/2023 0200   APPEARANCEUR CLEAR 01/19/2023 0200   LABSPEC 1.019 01/19/2023 0200   PHURINE 5.0 01/19/2023 0200   GLUCOSEU NEGATIVE 01/19/2023 0200   HGBUR MODERATE (A) 01/19/2023 0200   BILIRUBINUR NEGATIVE 01/19/2023 0200   BILIRUBINUR negative 02/01/2019 1022   KETONESUR NEGATIVE 01/19/2023 0200   PROTEINUR NEGATIVE 01/19/2023 0200   UROBILINOGEN 0.2 02/01/2019 1022   UROBILINOGEN 0.2 09/23/2012 1436   NITRITE NEGATIVE 01/19/2023 0200   LEUKOCYTESUR NEGATIVE 01/19/2023 0200   Sepsis Labs Recent Labs  Lab 01/18/23 1708 01/19/23 0255  WBC 12.1* 9.8   Microbiology Recent Results (from the past 240 hour(s))  Culture, blood (Routine X 2) w Reflex to ID Panel     Status: None (Preliminary result)   Collection Time: 01/18/23 10:48 PM   Specimen: BLOOD  Result Value Ref Range Status   Specimen Description BLOOD BLOOD RIGHT ARM  Final   Special Requests   Final    BOTTLES DRAWN AEROBIC AND ANAEROBIC Blood Culture adequate volume   Culture   Final    NO GROWTH < 12 HOURS Performed at [facility], [address] [place], [place] [address]    Report Status PENDING  Incomplete  Culture, blood (Routine X 2) w Reflex to ID  Panel     Status: None (Preliminary result)   Collection Time: 01/18/23 10:51 PM   Specimen: BLOOD  Result Value Ref Range Status   Specimen Description BLOOD BLOOD RIGHT ARM  Final   Special Requests   Final    BOTTLES DRAWN AEROBIC AND ANAEROBIC Blood Culture adequate volume    Culture   Final    NO GROWTH < 12 HOURS Performed at [facility], [address], [place], [place] [address]    Report Status PENDING  Incomplete     Time coordinating discharge: Over 30 minutes  SIGNED:   Huey Bienenstock, MD  [facility] 01/19/2023, 9:46 AM Pager   If 7PM-7AM, please contact night-coverage [email or web address]

## 2023-01-19 NOTE — Progress Notes (Signed)
Discharge order received. Patient discharge instructions given. No questions or concerns. Patient to discharge once completion of rocephin.

## 2023-01-19 NOTE — Progress Notes (Signed)
Pt discharged to home in stable condition. Discharge instructions reviewed with pt by primary nurse. Pt transported of unit via wheelchair with staff x1 at chairside to discharge lounge to await ride home.

## 2023-01-19 NOTE — Progress Notes (Signed)
Pt was bladder scanned for urine. Pt was straight cath for urine per Dr. Malena Catholic orders.

## 2023-01-19 NOTE — Discharge Instructions (Signed)
Follow with Primary MD  in 10 days   Get CBC, CMP, 2 view Chest X ray checked  by Primary MD next visit.    Activity: As tolerated with Full fall precautions use walker/cane & assistance as needed   Disposition Home    Diet: regular  On your next visit with your primary care physician please Get Medicines reviewed and adjusted.   Please request your Prim.MD to go over all Hospital Tests and Procedure/Radiological results at the follow up, please get all Hospital records sent to your Prim MD by signing hospital release before you go home.   If you experience worsening of your admission symptoms, develop shortness of breath, life threatening emergency, suicidal or homicidal thoughts you must seek medical attention immediately by calling 911 or calling your MD immediately  if symptoms less severe.  You Must read complete instructions/literature along with all the possible adverse reactions/side effects for all the Medicines you take and that have been prescribed to you. Take any new Medicines after you have completely understood and accpet all the possible adverse reactions/side effects.   Do not drive, operating heavy machinery, perform activities at heights, swimming or participation in water activities or provide baby sitting services if your were admitted for syncope or siezures until you have seen by Primary MD or a Neurologist and advised to do so again.  Do not drive when taking Pain medications.    Do not take more than prescribed Pain, Sleep and Anxiety Medications  Special Instructions: If you have smoked or chewed Tobacco  in the last 2 yrs please stop smoking, stop any regular Alcohol  and or any Recreational drug use.  Wear Seat belts while driving.   Please note  You were cared for by a hospitalist during your hospital stay. If you have any questions about your discharge medications or the care you received while you were in the hospital after you are discharged, you  can call the unit and asked to speak with the hospitalist on call if the hospitalist that took care of you is not available. Once you are discharged, your primary care physician will handle any further medical issues. Please note that NO REFILLS for any discharge medications will be authorized once you are discharged, as it is imperative that you return to your primary care physician (or establish a relationship with a primary care physician if you do not have one) for your aftercare needs so that they can reassess your need for medications and monitor your lab values.

## 2023-01-20 LAB — LEGIONELLA PNEUMOPHILA SEROGP 1 UR AG: L. pneumophila Serogp 1 Ur Ag: NEGATIVE

## 2023-01-20 LAB — MYCOPLASMA PNEUMONIAE ANTIBODY, IGM: Mycoplasma pneumo IgM: <770 U/mL (ref 0–769)

## 2023-01-21 LAB — CULTURE, BLOOD (ROUTINE X 2): Culture: NO GROWTH

## 2023-01-22 LAB — CULTURE, BLOOD (ROUTINE X 2): Culture: NO GROWTH

## 2023-01-23 LAB — CULTURE, BLOOD (ROUTINE X 2)
Special Requests: ADEQUATE
Special Requests: ADEQUATE

## 2023-02-11 ENCOUNTER — Ambulatory Visit (INDEPENDENT_AMBULATORY_CARE_PROVIDER_SITE_OTHER): Payer: Commercial Managed Care - HMO | Admitting: Physician Assistant

## 2023-02-11 ENCOUNTER — Encounter: Payer: Self-pay | Admitting: Physician Assistant

## 2023-02-11 ENCOUNTER — Other Ambulatory Visit (INDEPENDENT_AMBULATORY_CARE_PROVIDER_SITE_OTHER): Payer: Commercial Managed Care - HMO

## 2023-02-11 DIAGNOSIS — M84351A Stress fracture, right femur, initial encounter for fracture: Secondary | ICD-10-CM

## 2023-02-11 NOTE — Progress Notes (Signed)
Post-Op Visit Note   Patient: Shannon Spears           Date of Birth: October 31, 1980           MRN: [id number] Visit Date: 02/11/2023 PCP: Default, Provider, MD   Assessment & Plan:  Chief Complaint:  Chief Complaint  Patient presents with   Right Hip - Pain   Visit Diagnoses:  1. Stress fracture of neck of right femur     Plan: Patient is a 42 year old female who comes in today approximately 3 months status post right hip cannulated pinning for a femoral neck stress fracture, date of surgery 11/22/2022.  She has been doing okay in regards to her hip, but has been dealing with other comorbidities such as being recently hospitalized for sepsis from bilateral pneumonia.  Examination of her right hip reveals painless hip flexion and logroll.  She is neurovascularly intact distally.  At this point, we will release her from our standpoint.  She will follow-up with [place] as needed.  Call with concerns or questions.  Follow-Up Instructions: Return if symptoms worsen or fail to improve.   Orders:  Orders Placed This Encounter  Procedures   XR HIP UNILAT W OR W/O PELVIS 2-3 VIEWS RIGHT   No orders of the defined types were placed in this encounter.   Imaging: XR HIP UNILAT W OR W/O PELVIS 2-3 VIEWS RIGHT  Result Date: 02/11/2023 X-rays demonstrate stable alignment of the hardware without complication   PMFS History: Patient Active Problem List   Diagnosis Date Noted   CAP (community acquired pneumonia) 01/18/2023   Sepsis (HCC) 01/18/2023   Atypical chest pain 01/18/2023   Sinus tachycardia 01/18/2023   GAD (generalized anxiety disorder) 01/18/2023   GERD (gastroesophageal reflux disease) 01/18/2023   Mild intermittent asthma 01/18/2023   Essential hypertension 01/18/2023   Fracture, stress, femur, neck, initial encounter 11/22/2022   Knee contusion 04/06/2019   Bipolar 1 disorder (HCC) 06/16/2018   Chronic post-traumatic stress disorder 06/16/2018   Somatic symptom disorder  06/16/2018   Opioid use disorder, mild, in sustained remission (HCC) 06/16/2018   S/P cholecystectomy 02/10/2018   S/P cervical spinal fusion 12/08/2017   Protrusion of cervical intervertebral disc 12/08/2017   HNP (herniated nucleus pulposus), cervical 10/02/2012    Class: Diagnosis of   Past Medical History:  Diagnosis Date   Anxiety    Arthritis    Bipolar 1 disorder, manic, mild (HCC)    CKD (chronic kidney disease)    Depression    Fibromyalgia    GERD (gastroesophageal reflux disease)    Headache(784.0)    Palpitations     Family History  Problem Relation Age of Onset   Bipolar disorder Mother    Diabetes Mother    Hypertension Mother    Hypertension Father    Colon cancer Paternal Grandmother     Past Surgical History:  Procedure Laterality Date   ANTERIOR CERVICAL DECOMP/DISCECTOMY FUSION N/A 10/02/2012   Procedure: C4-5, C5-6 anterior cervical discectomy and fusion, allograft plate;  Surgeon: Eldred Manges, MD;  Location: [facility] OR;  Service: Orthopedics;  Laterality: N/A;   CHOLECYSTECTOMY     CYSTOGRAPHY     HIP PINNING,CANNULATED Right 11/22/2022   Procedure: RIGHT HIP PINNING;  Surgeon: Tarry Kos, MD;  Location: [facility] OR;  Service: Orthopedics;  Laterality: Right;   LAPAROSCOPIC TOTAL HYSTERECTOMY     OOPHORECTOMY     TUBAL LIGATION     Social History   Occupational History   Not  on file  Tobacco Use   Smoking status: Every Day    Packs/day: .5    Types: Cigarettes   Smokeless tobacco: Never  Vaping Use   Vaping Use: Never used  Substance and Sexual Activity   Alcohol use: Yes    Comment: rarely   Drug use: Yes    Types: Marijuana    Comment: occasionally   Sexual activity: Not on file

## 2023-02-23 ENCOUNTER — Encounter: Payer: Self-pay | Admitting: Orthopaedic Surgery

## 2023-02-24 ENCOUNTER — Other Ambulatory Visit: Payer: Self-pay | Admitting: Physician Assistant

## 2023-02-24 MED ORDER — METHOCARBAMOL 750 MG PO TABS: 750.0000 mg | ORAL_TABLET | Freq: Two times a day (BID) | ORAL | 2 refills | Status: AC | PRN

## 2023-02-24 NOTE — Telephone Encounter (Signed)
sent 

## 2023-03-06 ENCOUNTER — Other Ambulatory Visit: Payer: Self-pay

## 2023-03-06 ENCOUNTER — Emergency Department (HOSPITAL_COMMUNITY): Payer: Commercial Managed Care - HMO

## 2023-03-06 ENCOUNTER — Observation Stay (HOSPITAL_COMMUNITY)
Admission: EM | Admit: 2023-03-06 | Discharge: 2023-03-07 | Disposition: A | Discharge status: Home / Self Care | Payer: Commercial Managed Care - HMO | Attending: Internal Medicine | Admitting: Internal Medicine

## 2023-03-06 ENCOUNTER — Ambulatory Visit: Payer: Self-pay

## 2023-03-06 DIAGNOSIS — R0789 Other chest pain: Secondary | ICD-10-CM | POA: Diagnosis not present

## 2023-03-06 DIAGNOSIS — R748 Abnormal levels of other serum enzymes: Secondary | ICD-10-CM | POA: Insufficient documentation

## 2023-03-06 DIAGNOSIS — R079 Chest pain, unspecified: Principal | ICD-10-CM

## 2023-03-06 DIAGNOSIS — J9601 Acute respiratory failure with hypoxia: Secondary | ICD-10-CM | POA: Diagnosis not present

## 2023-03-06 DIAGNOSIS — Z1152 Encounter for screening for COVID-19: Secondary | ICD-10-CM | POA: Diagnosis not present

## 2023-03-06 DIAGNOSIS — D75839 Thrombocytosis, unspecified: Secondary | ICD-10-CM | POA: Diagnosis not present

## 2023-03-06 DIAGNOSIS — R252 Cramp and spasm: Secondary | ICD-10-CM | POA: Diagnosis not present

## 2023-03-06 DIAGNOSIS — E02 Subclinical iodine-deficiency hypothyroidism: Secondary | ICD-10-CM | POA: Insufficient documentation

## 2023-03-06 DIAGNOSIS — J45909 Unspecified asthma, uncomplicated: Secondary | ICD-10-CM | POA: Insufficient documentation

## 2023-03-06 DIAGNOSIS — F17210 Nicotine dependence, cigarettes, uncomplicated: Secondary | ICD-10-CM

## 2023-03-06 DIAGNOSIS — R0602 Shortness of breath: Secondary | ICD-10-CM | POA: Diagnosis present

## 2023-03-06 LAB — TROPONIN I (HIGH SENSITIVITY)
Troponin I (High Sensitivity): 5 ng/L (ref <18)
Troponin I (High Sensitivity): 3 ng/L (ref <18)

## 2023-03-06 LAB — CBC WITH DIFFERENTIAL/PLATELET
Abs Immature Granulocytes: 0.02 10*3/uL (ref 0.00–0.07)
Basophils Absolute: 0.1 10*3/uL (ref 0.0–0.1)
Basophils Relative: 1 %
Eosinophils Absolute: 0.2 10*3/uL (ref 0.0–0.5)
Eosinophils Relative: 3 %
HCT: 42.5 % (ref 36.0–46.0)
Hemoglobin: 13.8 g/dL (ref 12.0–15.0)
Immature Granulocytes: 0 %
Lymphocytes Relative: 29 %
Lymphs Abs: 2.6 10*3/uL (ref 0.7–4.0)
MCH: 29.3 pg (ref 26.0–34.0)
MCHC: 32.5 g/dL (ref 30.0–36.0)
MCV: 90.2 fL (ref 80.0–100.0)
Monocytes Absolute: 0.3 10*3/uL (ref 0.1–1.0)
Monocytes Relative: 4 %
Neutro Abs: 5.6 10*3/uL (ref 1.7–7.7)
Neutrophils Relative %: 63 %
Platelets: 477 10*3/uL — ABNORMAL HIGH (ref 150–400)
RBC: 4.71 MIL/uL (ref 3.87–5.11)
RDW: 15.1 % (ref 11.5–15.5)
WBC: 8.9 10*3/uL (ref 4.0–10.5)
nRBC: 0 % (ref 0.0–0.2)

## 2023-03-06 LAB — COMPREHENSIVE METABOLIC PANEL
ALT: 23 U/L (ref 0–44)
AST: 21 U/L (ref 15–41)
Albumin: 3.3 g/dL — ABNORMAL LOW (ref 3.5–5.0)
Alkaline Phosphatase: 154 U/L — ABNORMAL HIGH (ref 38–126)
Anion gap: 13 (ref 5–15)
BUN: 10 mg/dL (ref 6–20)
CO2: 25 mmol/L (ref 22–32)
Calcium: 8.8 mg/dL — ABNORMAL LOW (ref 8.9–10.3)
Chloride: 99 mmol/L (ref 98–111)
Creatinine, Ser: 0.93 mg/dL (ref 0.44–1.00)
GFR, Estimated: >60 mL/min (ref >60)
Glucose, Bld: 97 mg/dL (ref 70–99)
Potassium: 3.9 mmol/L (ref 3.5–5.1)
Sodium: 137 mmol/L (ref 135–145)
Total Bilirubin: <0.1 mg/dL — ABNORMAL LOW (ref 0.3–1.2)
Total Protein: 7.4 g/dL (ref 6.5–8.1)

## 2023-03-06 LAB — I-STAT VENOUS BLOOD GAS, ED
Acid-Base Excess: 2 mmol/L (ref 0.0–2.0)
Bicarbonate: 27.9 mmol/L (ref 20.0–28.0)
Calcium, Ion: 1.11 mmol/L — ABNORMAL LOW (ref 1.15–1.40)
HCT: 43 % (ref 36.0–46.0)
Hemoglobin: 14.6 g/dL (ref 12.0–15.0)
O2 Saturation: 55 %
Potassium: 4 mmol/L (ref 3.5–5.1)
Sodium: 140 mmol/L (ref 135–145)
TCO2: 29 mmol/L (ref 22–32)
pCO2, Ven: 48.9 mmHg (ref 44–60)
pH, Ven: 7.364 (ref 7.25–7.43)
pO2, Ven: 30 mmHg — CL (ref 32–45)

## 2023-03-06 LAB — SARS CORONAVIRUS 2 BY RT PCR: SARS Coronavirus 2 by RT PCR: NEGATIVE

## 2023-03-06 LAB — PROCALCITONIN: Procalcitonin: <0.1 ng/mL

## 2023-03-06 LAB — HCG, QUANTITATIVE, PREGNANCY: hCG, Beta Chain, Quant, S: 5 m[IU]/mL — ABNORMAL HIGH (ref <5)

## 2023-03-06 MED ORDER — FAMOTIDINE 20 MG PO TABS
20.0000 mg | ORAL_TABLET | Freq: Two times a day (BID) | ORAL | Status: DC
  Administered 2023-03-06 – 2023-03-07 (×2): 20 mg via ORAL
  Filled 2023-03-06 (×2): qty 1

## 2023-03-06 MED ORDER — LAMOTRIGINE 25 MG PO TABS
200.0000 mg | ORAL_TABLET | Freq: Every day | ORAL | Status: DC
  Administered 2023-03-06: 200 mg via ORAL
  Filled 2023-03-06: qty 8

## 2023-03-06 MED ORDER — METOPROLOL SUCCINATE ER 25 MG PO TB24
25.0000 mg | ORAL_TABLET | Freq: Every day | ORAL | Status: DC
  Administered 2023-03-07: 25 mg via ORAL
  Filled 2023-03-06: qty 1

## 2023-03-06 MED ORDER — LORATADINE 10 MG PO TABS
10.0000 mg | ORAL_TABLET | Freq: Every day | ORAL | Status: DC
  Administered 2023-03-07: 10 mg via ORAL
  Filled 2023-03-06: qty 1

## 2023-03-06 MED ORDER — MONTELUKAST SODIUM 10 MG PO TABS
10.0000 mg | ORAL_TABLET | Freq: Every day | ORAL | Status: DC
  Administered 2023-03-07: 10 mg via ORAL
  Filled 2023-03-06: qty 1

## 2023-03-06 MED ORDER — OYSTER SHELL CALCIUM/D3 500-5 MG-MCG PO TABS
1.0000 | ORAL_TABLET | Freq: Every day | ORAL | Status: DC
  Administered 2023-03-07: 1 via ORAL
  Filled 2023-03-06: qty 1

## 2023-03-06 MED ORDER — CYCLOBENZAPRINE HCL 10 MG PO TABS: 10.0000 mg | ORAL_TABLET | Freq: Three times a day (TID) | ORAL | Status: DC | PRN

## 2023-03-06 MED ORDER — MOMETASONE FURO-FORMOTEROL FUM 100-5 MCG/ACT IN AERO
2.0000 | INHALATION_SPRAY | Freq: Two times a day (BID) | RESPIRATORY_TRACT | Status: DC
  Filled 2023-03-06: qty 8.8

## 2023-03-06 MED ORDER — LEVETIRACETAM 500 MG PO TABS
1000.0000 mg | ORAL_TABLET | Freq: Two times a day (BID) | ORAL | Status: DC
  Administered 2023-03-06 – 2023-03-07 (×2): 1000 mg via ORAL
  Filled 2023-03-06 (×2): qty 2

## 2023-03-06 MED ORDER — IPRATROPIUM-ALBUTEROL 0.5-2.5 (3) MG/3ML IN SOLN: 3.0000 mL | RESPIRATORY_TRACT | Status: DC | PRN

## 2023-03-06 MED ORDER — ONDANSETRON 4 MG PO TBDP: 4.0000 mg | ORAL_TABLET | Freq: Three times a day (TID) | ORAL | Status: DC | PRN

## 2023-03-06 MED ORDER — HYDROXYZINE HCL 25 MG PO TABS
25.0000 mg | ORAL_TABLET | Freq: Three times a day (TID) | ORAL | Status: DC | PRN
  Administered 2023-03-06: 25 mg via ORAL
  Filled 2023-03-06: qty 1

## 2023-03-06 MED ORDER — IOHEXOL 350 MG/ML SOLN
75.0000 mL | Freq: Once | INTRAVENOUS | Status: AC | PRN
  Administered 2023-03-06: 75 mL via INTRAVENOUS

## 2023-03-06 MED ORDER — ENOXAPARIN SODIUM 40 MG/0.4ML IJ SOSY
40.0000 mg | PREFILLED_SYRINGE | INTRAMUSCULAR | Status: DC
  Administered 2023-03-07: 40 mg via SUBCUTANEOUS
  Filled 2023-03-06: qty 0.4

## 2023-03-06 MED ORDER — AZELASTINE HCL 0.1 % NA SOLN
1.0000 | Freq: Two times a day (BID) | NASAL | Status: DC
  Administered 2023-03-07: 1 via NASAL
  Filled 2023-03-06: qty 30

## 2023-03-06 MED ORDER — SODIUM CHLORIDE 0.9 % IV SOLN
2.0000 g | Freq: Once | INTRAVENOUS | Status: AC
  Administered 2023-03-06: 2 g via INTRAVENOUS
  Filled 2023-03-06: qty 12.5

## 2023-03-06 MED ORDER — DIAZEPAM 5 MG PO TABS
10.0000 mg | ORAL_TABLET | Freq: Three times a day (TID) | ORAL | Status: DC | PRN
  Administered 2023-03-06: 10 mg via ORAL
  Filled 2023-03-06: qty 2

## 2023-03-06 MED ORDER — LACTATED RINGERS IV BOLUS
1000.0000 mL | Freq: Once | INTRAVENOUS | Status: AC
  Administered 2023-03-06: 1000 mL via INTRAVENOUS

## 2023-03-06 MED ORDER — VANCOMYCIN HCL 1500 MG/300ML IV SOLN
1500.0000 mg | Freq: Once | INTRAVENOUS | Status: AC
  Administered 2023-03-06: 1500 mg via INTRAVENOUS
  Filled 2023-03-06: qty 300

## 2023-03-06 NOTE — ED Provider Notes (Signed)
[place] EMERGENCY DEPARTMENT AT [facility] Provider Note   CSN: [id number] Arrival date & time: 03/06/23  1613     History Chief Complaint  Patient presents with   Chest Pain    Pt coming from home with chest pain, shob and dizziness since last night. Pt had been seen for sepsis and pneumonia back in June.     HPI Shannon Spears is a 42 y.o. female presenting for chief complaint of chest pain and dizziness.  Notably, she has an extensive medical history including recent admission for pneumonia with sepsis approximately 2 months ago. She endorses similar symptoms with productive sputum, cough, shortness of breath and fatigue. Additionally has a exquisite psychiatric history including substance use disorder, bipolar 1.  Her medication list includes 2 muscle relaxers, Valium (30 mg/day), Atarax, Keppra, and patient states she takes things for sleep as well not listed. During our conversation, patient can barely keep her eyes open and states that she is "exhausted".   Patient's recorded medical, surgical, social, medication list and allergies were reviewed in the Snapshot window as part of the initial history.   Review of Systems   Review of Systems  Constitutional:  Positive for fatigue. Negative for chills and fever.  HENT:  Negative for ear pain and sore throat.   Eyes:  Negative for pain and visual disturbance.  Respiratory:  Positive for cough. Negative for shortness of breath.   Cardiovascular:  Negative for chest pain and palpitations.  Gastrointestinal:  Negative for abdominal pain and vomiting.  Genitourinary:  Negative for dysuria and hematuria.  Musculoskeletal:  Negative for arthralgias and back pain.  Skin:  Negative for color change and rash.  Neurological:  Positive for weakness. Negative for seizures and syncope.  All other systems reviewed and are negative.   Physical Exam Updated Vital Signs BP 114/77   Pulse (!) 106   Temp 97.7 F (36.5 C) (Oral)    Resp 18   Ht 5\' 1"  (1.549 m)   Wt 81.6 kg   LMP 10/02/2012   SpO2 96%   BMI 34.01 kg/m  Physical Exam Vitals and nursing note reviewed.  Constitutional:      General: She is not in acute distress.    Appearance: She is well-developed.  HENT:     Head: Normocephalic and atraumatic.  Eyes:     Conjunctiva/sclera: Conjunctivae normal.  Cardiovascular:     Rate and Rhythm: Normal rate and regular rhythm.     Heart sounds: No murmur heard. Pulmonary:     Effort: Pulmonary effort is normal. No respiratory distress.     Breath sounds: Normal breath sounds.  Abdominal:     General: There is no distension.     Palpations: Abdomen is soft.     Tenderness: There is no abdominal tenderness. There is no right CVA tenderness or left CVA tenderness.  Musculoskeletal:        General: No swelling or tenderness. Normal range of motion.     Cervical back: Neck supple.  Skin:    General: Skin is warm and dry.  Neurological:     General: No focal deficit present.     Mental Status: She is alert and oriented to person, place, and time. Mental status is at baseline.     Cranial Nerves: No cranial nerve deficit.      ED Course/ Medical Decision Making/ A&P    Procedures Procedures   Medications Ordered in ED Medications  vancomycin (VANCOREADY) IVPB 1500 mg/300  mL (has no administration in time range)  ceFEPIme (MAXIPIME) 2 g in sodium chloride 0.9 % 100 mL IVPB (2 g Intravenous New Bag/Given 03/06/23 2205)  mometasone-formoterol (DULERA) 100-5 MCG/ACT inhaler 2 puff (has no administration in time range)  lactated ringers bolus 1,000 mL (0 mLs Intravenous Stopped 03/06/23 2021)  iohexol (OMNIPAQUE) 350 MG/ML injection 75 mL (75 mLs Intravenous Contrast Given 03/06/23 1832)    Medical Decision Making:    Shannon Spears is a 42 y.o. female who presented to the ED today with shortness of breath and fatigue detailed above.     Handoff received from EMS.  Additional history discussed with  patient's family/caregivers.  Patient placed on continuous vitals and telemetry monitoring while in ED which was reviewed periodically.   Complete initial physical exam performed, notably the patient  was hemodynamically stable in no acute distress.      Reviewed and confirmed nursing documentation for past medical history, family history, social history.    Initial Assessment:   With the patient's presentation of shortness of breath, most likely diagnosis is recurrent pneumonia likely viral etiology given current presentation of bacterial etiology be on the differential. Other diagnoses were considered including (but not limited to) CAD such as ACS or angina, pulmonary embolism, pneumothorax, aortic dissection. These are considered less likely due to history of present illness and physical exam findings.   This is most consistent with an acute life/limb threatening illness complicated by underlying chronic conditions. Patient is moderate [place] because of her arrival tachycardia, oxygen less than 94, recent hospitalization Initial Plan:  Will start with a chest x-ray.  If this gives a clear image of pneumonia then we will stick with recurrent pneumonias likely diagnosis.  However anticipate CT angiography chest to evaluate for intrathoracic surgical pathology if chest x-ray is nondiagnostic. Screening labs including CBC and Metabolic panel to evaluate for infectious or metabolic etiology of disease.  Urinalysis with reflex culture ordered to evaluate for UTI or relevant urologic/nephrologic pathology.  Serial troponin and EKG to evaluate for cardiac pathology.  Otherwise low heart score (less than 3) Objective evaluation as below reviewed with plan for close reassessment  Initial Study Results:   Laboratory  All laboratory results reviewed without evidence of clinically relevant pathology.    EKG EKG was reviewed independently. Rate, rhythm, axis, intervals all examined and without medically  relevant abnormality. ST segments without concerns for elevations.    Radiology  All images reviewed independently. Agree with radiology report at this time.   CT Angio Chest Pulmonary Embolism (PE) W or WO Contrast  Result Date: 03/06/2023 CLINICAL DATA:  High probability for PE EXAM: CT ANGIOGRAPHY CHEST WITH CONTRAST TECHNIQUE: Multidetector CT imaging of the chest was performed using the standard protocol during bolus administration of intravenous contrast. Multiplanar CT image reconstructions and MIPs were obtained to evaluate the vascular anatomy. RADIATION DOSE REDUCTION: This exam was performed according to the departmental dose-optimization program which includes automated exposure control, adjustment of the mA and/or kV according to patient size and/or use of iterative reconstruction technique. CONTRAST:  75mL OMNIPAQUE IOHEXOL 350 MG/ML SOLN COMPARISON:  CT angiogram chest 01/18/2023 FINDINGS: Cardiovascular: Satisfactory opacification of the pulmonary arteries to the segmental level. No evidence of pulmonary embolism. Normal heart size. No pericardial effusion. Mediastinum/Nodes: No enlarged mediastinal, hilar, or axillary lymph nodes. Thyroid gland, trachea, and esophagus demonstrate no significant findings. Lungs/Pleura: There are bands of atelectasis/airspace disease in the bilateral lower lobes, lingula and right middle lobe. These have mildly increased  on the right. Additional scattered multifocal ground-glass opacities are seen in both mid and lower lungs, mildly increased. There is no pleural effusion or pneumothorax. Upper Abdomen: No acute abnormality. Musculoskeletal: No chest wall abnormality. No acute or significant osseous findings. Review of the MIP images confirms the above findings. IMPRESSION: 1. No evidence for pulmonary embolism. 2. Mild increase in bilateral lower lobe, right middle lobe and lingular atelectasis/airspace disease. 3. Mild increase in scattered multifocal  ground-glass opacities in the mid and lower lungs. Findings are nonspecific and may be infectious/inflammatory. Electronically Signed   By: Darliss Cheney M.D.   On: 03/06/2023 18:40   DG Chest Portable 1 View  Result Date: 03/06/2023 CLINICAL DATA:  Shortness of breath EXAM: PORTABLE CHEST 1 VIEW COMPARISON:  01/18/2023 FINDINGS: Underinflation. There is some mild opacity seen along bases, left-greater-than-right. No pneumothorax or effusion. No edema. Normal cardiopericardial silhouette. Overlapping cardiac leads. Fixation hardware along the lower cervical spine. IMPRESSION: Underinflation with some lung base opacities, left-greater-than-right. Possible infiltrate on the left and more atelectasis on the right. Recommend follow-up to confirm clearance. Electronically Signed   By: Karen Kays M.D.   On: 03/06/2023 17:44     Consults:  Case discussed with internal medicine team.   Reassessment and Plan:   Patient CTA PE study resulted indeterminate.  There is a very challenging evaluation.  She is endorsing mucus production especially thick green mucus with frequent cough, she is hypoxic 90% required 2 L nasal cannula which is new.  She is tachycardic.  Infection would remain on the differential.  Reviewing her history she has a history of opiate use disorder and might be at risk for MRSA infection.  Possible underlying etiology for her treatment failure with prior antibiotic rounds.  Has had a full course of inpatient antibiotics followed by an outpatient azithromycin 7-day course per her recollection.  Consult internal medicine for admission.  Started on treatment for MRSA empirically pending sputum culture and recommended for further care and management.  May ultimately need pulmonary consultation for underlying disease pathology or workup for alternative source of her infection such as endocarditis with septic emboli though currently seems unlikely.   Disposition:   Based on the above findings, I  believe this patient is stable for admission.    Patient/family educated about specific findings on our evaluation and explained exact reasons for admission.  Patient/family educated about clinical situation and time was allowed to answer questions.   Admission team communicated with and agreed with need for admission. Patient admitted. Patient ready to move at this time.     Emergency Department Medication Summary:   Medications  vancomycin (VANCOREADY) IVPB 1500 mg/300 mL (1,500 mg Intravenous New Bag/Given 03/06/23 2238)  metoprolol succinate (TOPROL-XL) 24 hr tablet 25 mg (has no administration in time range)  diazepam (VALIUM) tablet 10 mg (has no administration in time range)  hydrOXYzine (ATARAX) tablet 25 mg (has no administration in time range)  famotidine (PEPCID) tablet 20 mg (has no administration in time range)  ondansetron (ZOFRAN-ODT) disintegrating tablet 4 mg (has no administration in time range)  cyclobenzaprine (FLEXERIL) tablet 10 mg (has no administration in time range)  lamoTRIgine (LAMICTAL) tablet 200 mg (has no administration in time range)  levETIRAcetam (KEPPRA) tablet 1,000 mg (has no administration in time range)  Calcium-Vitamin D 600-5 MG-MCG TABS 1 tablet (has no administration in time range)  montelukast (SINGULAIR) tablet 10 mg (has no administration in time range)  ipratropium-albuterol (DUONEB) 0.5-2.5 (3) MG/3ML nebulizer solution 3  mL (has no administration in time range)  loratadine (CLARITIN) tablet 10 mg (has no administration in time range)  azelastine (ASTELIN) 0.1 % nasal spray 1 spray (has no administration in time range)  enoxaparin (LOVENOX) injection 40 mg (has no administration in time range)  mometasone-formoterol (DULERA) 100-5 MCG/ACT inhaler 2 puff (has no administration in time range)  lactated ringers bolus 1,000 mL (0 mLs Intravenous Stopped 03/06/23 2021)  iohexol (OMNIPAQUE) 350 MG/ML injection 75 mL (75 mLs Intravenous Contrast Given  03/06/23 1832)  ceFEPIme (MAXIPIME) 2 g in sodium chloride 0.9 % 100 mL IVPB (0 g Intravenous Stopped 03/06/23 2237)         Clinical Impression:  1. Chest pain, unspecified type      Admit   Final Clinical Impression(s) / ED Diagnoses Final diagnoses:  Chest pain, unspecified type    Rx / DC Orders ED Discharge Orders     None         Glyn Ade, MD 03/06/23 2240

## 2023-03-06 NOTE — Hospital Course (Addendum)
Cp and productive [place], dyspnua Sat 80s ambulated Admit June pna and sepsis (multifocal pna, 5 days abx 3rd gen cef + azithro Just finished 7 days azithro for sinusitis?? Came in on neb from ems Chills no fevers  Normal exercise stress echo 04/2021  Skin rash on arms last month, steroids  3 days, since admission did not get back to normal Feels like kick or sitting on chest, same as June admission, dyspnea, uri symptpms,  Abx 2x since discharge, azithro, finished 7/2 Coughing up green sputum  Was at a childrens birthday party Saturday, then Monday started to have uri symptoms  Appointment with pcp monday Has frequent allergies but this cough is worse  Gets chest pain with anxiety and gerd but different, usually left side but now more right side This pain is accompanied by lightheadedness, no definite diaphoresis  Almost felt like asthma attack today  Nausea, insomnia No vomiting or diarrhea   Inhalers ran out, over a year ago????  Lower abd pain with axiety, nothing new or worsening  Cards 02/26/2023 Plan:  Increase metoprolol dose to 37.5 mg QHS Hold lisinopril for now Start ivabradine 5 mg twice a day Referral to sleep specialist placed Patient was instructed to increase fluid intake to at least 64 ounces daily.   Medications Robaxin-750 nightly as needed, still on flexeril from pain specialist (does not mix) Albuterol neb and inhaler Symbicort 2x bid, ran out of this  Calcium and vitamin D Diazepam 10 mg tid usualy Famotidine 20 mg bid Hydroxyzine 25 mg daily, takes 3 times daily Ivabradine 5 mg twice daily, has not filled  Lactobacillus Lamotrigine 200 mg daily Keppra 1000 mg twice daily, 500 qid Levothyroxine 25 mcg daily started 01/2023 Lisinopril 2.5 mg daily, stopped it  Loperamide Metoprolol succinate 37.5 mg nightly Zofran Prednisone taper as follows Vitamin D, vitamin D3 Singulair Azelastine Cetirizine  Renal disease(iga nephropathy) in  family HTN parents Mom t2dm, cervical cancer PGM colon cancer  Blood clots in past, left pcp due to pressure to get covid shot Left upper extremity duplex 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 2011 and again later, no blood thinners Chronic migraines since getting covid in 2022, sees neurology Fibromyalgia, tachycardic palpitations, thrombocytopenia, asthma  Lives at home with husband, kid, 3 dogs, parrot, chickens, Neurosurgeon, prior hairdresser  Independent ?? Missed some of that PCP Nyra Jabs PA Tobacco 1/2 ppd, 25 years, restarted 6 months ago Etoh no Drugs occasional mj

## 2023-03-06 NOTE — H&P (Signed)
Date: 03/06/2023               Patient Name:  Shannon Spears MRN: [id number]  DOB: Sep 01, 1980 Age / Sex: 42 y.o., female   PCP: Susann Givens, PA-C         Medical Service: Internal Medicine Teaching Service         Attending Physician: Dr. Gust Rung, DO      First Contact: Dr. Monna Fam, MD Pager [phone]    Second Contact: Dr. Olegario Messier, MD Pager [phone]         After Hours (After 5p/  First Contact Pager: [phone]  weekends / holidays): Second Contact Pager: [phone]   SUBJECTIVE   Chief Complaint: Dyspnea with productive cough, weakness, chest pain  History of Present Illness:  Shannon Spears is a 42 y.o. F with PMH intermittent asthma, tachycardia, possible IgA nephropathy, migraines, bipolar disorder, GAD, GERD presented to [facility] ED via EMS with 3 day history of dyspnea, cough producing green phlegm, generalized weakness, and chest pain. She stated that these symptoms started roughly 3 days ago, Monday, in conjunction with worsening seasonal allergies. The symptoms are constant. The chest pain is different from the anxiety and GERD symptoms she normally experiences - now "pressure like being kicked in chest." She denied known sick contacts but reported going to a child's birthday party on the Saturday before at a public bowling alley. She denied fever, chills, abdominal pain, diarrhea, vomiting, changes in bowel or bladder habits. She ascribed to headache, sinus pressure, cough with green phlegm, dyspnea at rest and exertion, chest pain, nausea, and fatigue.  She was hospitalized with multifocal pneumonia in 01/2023 and states these symptoms are very similar to then. She stated she has not fully recovered from that illness, with malaise and frequent dyspnea. Then, she was treated with rocephin and azithromycin and discharged on oral abx. It does not appear that any bacteria were isolated then. She has felt the need to use her expired albuterol inhaler daily with some improvement.  She also was treated with azithromycin as an outpatient at the end of June for suspected persistent pneumonia.  ED Course: She arrived at [facility] ED via EMS because she had no alternative transportation. She does not believe nebulized breathing treatment in route did not relieve her symptoms. In the ED, satting well at rest but desaturated into the 80s during ambulation. She was placed on oxygen 2L [place]. CXR revealed b/l lung base opacities greater on L side (favoring possible infiltrate) than on R side (favoring atelectasis). No pneumothorax, effusion, edema. CT PE identified similar lung base opacities in b/l lower lobe and R middle lobe with scattered multifocal ground glass opacities. No PE identified. CBC only remarkable for thrombocytosis 477 which is chronic and she did not have elevated WBCs (8.9). CMP albumin 3.3 and Alk phos 154 (chronically elevated, at baseline). Troponins negative. Covid negative. Sputum and blood cultures were collected. She was started on Vanc and Cefepime. IMTS took over care.  Past Medical History Intermittent asthma Tachycardia (sees cardiology) Migraines (sees neurology) IgA nephropathy (per nephrology in 2020 due to proteinuria, but no biopsy done) Bipolar disorder and GAD GERD  Meds: Diazepam 10mg  TID prn Famotidine 20mg  BID Hydroxyzine 25 TID prn Lamotrigine 200 qhs Keppra 1000 BID Synthroid po qd Robaxin 750mg  BID prn Metoprolol succinate 37.5 mg nightly Ivabradine 5 mg BID (has not filled) Albuterol prn Azelastine 0.1% nasal spray Singulair 10mg  qd Zofran 4mg  po q8h prn Vitamin D 50000u  weekly Lactobacillus probiotic  Past Surgical History Past Surgical History:  Procedure Laterality Date   ANTERIOR CERVICAL DECOMP/DISCECTOMY FUSION N/A 10/02/2012   Procedure: C4-5, C5-6 anterior cervical discectomy and fusion, allograft plate;  Surgeon: Eldred Manges, MD;  Location: [facility] OR;  Service: Orthopedics;  Laterality: N/A;   CHOLECYSTECTOMY      CYSTOGRAPHY     HIP PINNING,CANNULATED Right 11/22/2022   Procedure: RIGHT HIP PINNING;  Surgeon: Tarry Kos, MD;  Location: [facility] OR;  Service: Orthopedics;  Laterality: Right;   LAPAROSCOPIC TOTAL HYSTERECTOMY     OOPHORECTOMY     TUBAL LIGATION      Social:  Lives at home with husband and one child Occupation: homemaker, former hairdresser Support: from family Level of Function: independent in ADLs/iADLs PCP: Susann Givens, PA-C Substances: smoker of 1/2 ppd for 20+ years, no alcohol, rare marijuana (1-2x/month)  Family History:  Cervical cancer in mother Colon cancer in paternal grandmother HTN, T2DM  Allergies: Allergies as of 03/06/2023 - Review Complete 03/06/2023  Allergen Reaction Noted   Buprenorphine Shortness Of Breath and Rash 09/21/2012   Penicillins Anaphylaxis, Nausea And Vomiting, and Swelling 09/21/2012   Dermatitis antigen  12/24/2018   Gabapentin Nausea And Vomiting 12/24/2018   Nsaids  08/13/2019   Propoxyphene Nausea Only 12/01/2014   Tetanus toxoids  09/21/2012   Adhesive [tape] Dermatitis 12/24/2018   Codeine  09/21/2012   Ibuprofen  12/24/2018   Doxycycline Rash 11/13/2016    Review of Systems: A complete ROS was negative except as per HPI.   OBJECTIVE:   Physical Exam: Blood pressure 113/73, pulse (!) 106, temperature 97.7 F (36.5 C), temperature source Oral, resp. rate 16, height 5\' 1"  (1.549 m), weight 81.6 kg, last menstrual period 10/02/2012, SpO2 96%.  Constitutional: Well appearing female in bed. In no acute distress. HENT: Normocephalic, atraumatic,  Eyes: Sclera non-icteric, PERRL, EOM intact Cardio:Regular rate and rhythm. No murmurs, rubs, or gallops. 2+ bilateral radial and dorsalis pedis  pulses. Pulm: Decreased lung sounds at b/l bases. Upper lungs CTABL. No wheezes. Normal work of breathing on 2L [place]. Abdomen: Soft, non-tender, non-distended, positive bowel sounds. ZOX:WRUEAVWU for extremity edema. Distal sternum tender to  palpation Skin:Warm and dry. Neuro:Alert and oriented x3. No focal deficit noted. Psych:Pleasant mood and affect.  Labs: CBC    Component Value Date/Time   WBC 8.9 03/06/2023 1650   RBC 4.71 03/06/2023 1650   HGB 14.6 03/06/2023 1701   HGB 14.2 02/01/2019 1012   HCT 43.0 03/06/2023 1701   HCT 41.8 02/01/2019 1012   PLT 477 (H) 03/06/2023 1650   PLT 518 (H) 02/01/2019 1012   MCV 90.2 03/06/2023 1650   MCV 96 02/01/2019 1012   MCH 29.3 03/06/2023 1650   MCHC 32.5 03/06/2023 1650   RDW 15.1 03/06/2023 1650   RDW 13.6 02/01/2019 1012   LYMPHSABS 2.6 03/06/2023 1650   LYMPHSABS 3.7 (H) 02/01/2019 1012   MONOABS 0.3 03/06/2023 1650   EOSABS 0.2 03/06/2023 1650   EOSABS 0.3 02/01/2019 1012   BASOSABS 0.1 03/06/2023 1650   BASOSABS 0.1 02/01/2019 1012     CMP     Component Value Date/Time   NA 140 03/06/2023 1701   NA 135 02/01/2019 1012   K 4.0 03/06/2023 1701   CL 99 03/06/2023 1650   CO2 25 03/06/2023 1650   GLUCOSE 97 03/06/2023 1650   BUN 10 03/06/2023 1650   BUN 8 02/01/2019 1012   CREATININE 0.93 03/06/2023 1650   CALCIUM 8.8 (  L) 03/06/2023 1650   PROT 7.4 03/06/2023 1650   PROT 6.9 02/01/2019 1012   ALBUMIN 3.3 (L) 03/06/2023 1650   ALBUMIN 4.1 02/01/2019 1012   AST 21 03/06/2023 1650   ALT 23 03/06/2023 1650   ALKPHOS 154 (H) 03/06/2023 1650   BILITOT <0.1 (L) 03/06/2023 1650   BILITOT 0.3 02/01/2019 1012   GFRNONAA >60 03/06/2023 1650   GFRAA 127 02/01/2019 1012    Imaging: CT Angio Chest Pulmonary Embolism (PE) W or WO Contrast  Result Date: 03/06/2023 CLINICAL DATA:  High probability for PE EXAM: CT ANGIOGRAPHY CHEST WITH CONTRAST TECHNIQUE: Multidetector CT imaging of the chest was performed using the standard protocol during bolus administration of intravenous contrast. Multiplanar CT image reconstructions and MIPs were obtained to evaluate the vascular anatomy. RADIATION DOSE REDUCTION: This exam was performed according to the departmental  dose-optimization program which includes automated exposure control, adjustment of the mA and/or kV according to patient size and/or use of iterative reconstruction technique. CONTRAST:  75mL OMNIPAQUE IOHEXOL 350 MG/ML SOLN COMPARISON:  CT angiogram chest 01/18/2023 FINDINGS: Cardiovascular: Satisfactory opacification of the pulmonary arteries to the segmental level. No evidence of pulmonary embolism. Normal heart size. No pericardial effusion. Mediastinum/Nodes: No enlarged mediastinal, hilar, or axillary lymph nodes. Thyroid gland, trachea, and esophagus demonstrate no significant findings. Lungs/Pleura: There are bands of atelectasis/airspace disease in the bilateral lower lobes, lingula and right middle lobe. These have mildly increased on the right. Additional scattered multifocal ground-glass opacities are seen in both mid and lower lungs, mildly increased. There is no pleural effusion or pneumothorax. Upper Abdomen: No acute abnormality. Musculoskeletal: No chest wall abnormality. No acute or significant osseous findings. Review of the MIP images confirms the above findings. IMPRESSION: 1. No evidence for pulmonary embolism. 2. Mild increase in bilateral lower lobe, right middle lobe and lingular atelectasis/airspace disease. 3. Mild increase in scattered multifocal ground-glass opacities in the mid and lower lungs. Findings are nonspecific and may be infectious/inflammatory. Electronically Signed   By: Darliss Cheney M.D.   On: 03/06/2023 18:40   DG Chest Portable 1 View  Result Date: 03/06/2023 CLINICAL DATA:  Shortness of breath EXAM: PORTABLE CHEST 1 VIEW COMPARISON:  01/18/2023 FINDINGS: Underinflation. There is some mild opacity seen along bases, left-greater-than-right. No pneumothorax or effusion. No edema. Normal cardiopericardial silhouette. Overlapping cardiac leads. Fixation hardware along the lower cervical spine. IMPRESSION: Underinflation with some lung base opacities,  left-greater-than-right. Possible infiltrate on the left and more atelectasis on the right. Recommend follow-up to confirm clearance. Electronically Signed   By: Karen Kays M.D.   On: 03/06/2023 17:44     EKG: personally reviewed my interpretation is sinus rhythm, tachy 104, no ST changes. Consistent with prior EKG.  ASSESSMENT & PLAN:   Assessment & Plan by Problem: Principal Problem:   Acute hypoxic respiratory failure (HCC)   Liliann File is a 42 y.o. female with pertinent PMH of intermittent asthma, tachycardia, possible IgA nephropathy, migraines, bipolar disorder, GAD, and GERD who presented with 3 day history of dyspnea, cough producing green phlegm, generalized weakness, and chest pain and is admitted for acute hypoxic respiratory failure.  Acute hypoxic respiratory failure CAP vs Asthma vs COPD Atypical chest pain Etiology is unclear but pt demonstrated O2 desaturation on ambulation, decreased sounds in the lung bases, and opacities in the b/l lung bases per imaging that were considered progressive from a hospitalization in June. She does not have leukocytosis or a fever. Tachycardic but known issue and recently started on  thyroid replacement.  At the moment, am favoring CAP viral pneumonia vs asthma exacerbation as the cause of her symptoms. This is in consideration of underlying asthma, daily dyspnea for months inspiring frequent use of albuterol, and recent triggers including environmental allergies and a child's birthday party. Accordingly, will do full viral panel and start her on dulera BID and duonebs prn. It is worth noting that she does not have wheezing on exam. Notably, her albuterol and Symbicort prescriptions were stopped about 1 year ago. Will also consider undiagnosed COPD with exacerbation given smoking history and sputum production (per PCP notes the patient has been referred to pulmonology for possible COPD).  At this point, bacterial pneumonia appears unlikely without  a white count, fever, and with a negative procal, but cultures have been drawn. Thus, will hold antibiotics right now (she has already received one dose of  Vanc 1500 and two doses of Cefepime 2g) but can restart with worsening condition. It does not appear that bacteria were isolated at her previous hospitalization. PE and pneumothorax ruled out based on imaging, no pleural effusion, ACS unlikely with negative troponins and no ECG chanes. - Dulera BID and duonebs prn - viral panel, blood culture, respiratory panel, UA w/ reflex, HIV antibody pending - Daily CBC, CMP - Droplet precautions - Wean O2 as tolerated  Tachycardia Palpitations Pt sees cardiology at [facility] in [place] for resting HR as high as the 150s. Holter monitor from 2022 with NSR and occasional PVCs. Recently added ivabradine at cardiology appointment but has not picked this up. Continues on metroprolol succinate XL 25mg  po every day. - continue metoprolol succinate 37.5 mg daily  Subclinical Hypothyroidism 01/2023 TSH of 5.1 and normal free T4. Started on levothyroxine outpatient.  - holding home synthroid per tachycardia and dyspnea   Nausea Nausea x 3 days that was relieved with Zofran from EMS. Denies vomiting, diarrhea, fevers, chills, changes in bowel/bladder. Was on bentyl in the past for possible IBS. - Zofran 4mg  po q8h prn  Elevated Alkaline Phosphatase Appears chronic and mild. <2x ULN. Can consider GGT but likely not contributing to acute presentation.   Hx of Upper Extremity Superficial Vein Thrombosis Thrombocytosis 2 episodes of left upper extremity superficial vein thrombosis. We can see the scan from 2011 which showed left cephalic and left basilic vein occlusive thrombus. Treated with anticoagulation at that time. No chronic anticoagulation and no signs of DVT/SVT here. Does have chronic mild thrombocytosis. No known prior blood smear.   Chronic conditions  GERD - Pepcid 20mg  po  BID  Bipolar Disorder GAD Pt reports increased stress lately due to limitations from what is now three months of malaise.  - Diazepam 10mg  po TID prn - hydroxyzine 25mg  po TID prn - lamotrigine 200mg  po at bedtime - Keppra 1000mg  po BID  Seasonal Allergies - Azelastine 0.1% nasal spray - loratadine 10mg  po every day - montelukast 10mg  po qd  Chronic R leg pain with muscle spasms - Home flexeril  Diet: Normal VTE: Enoxaparin IVF: None,None Code: Full  Dispo: Admit patient to Observation with expected length of stay less than 2 midnights.  Signed: Katheran James, DO Internal Medicine Resident PGY-1  03/06/2023, 10:58 PM   Dr. Monna Fam, MD Pager [phone]

## 2023-03-06 NOTE — Telephone Encounter (Signed)
  Chief Complaint: Difficulty breathing, fatigue, chest tightness Symptoms: above Frequency: has not felt well since pneumonia Pertinent Negatives: Patient denies  Disposition: [x] ED /[] Urgent Care (no appt availability in office) / [] Appointment(In office/virtual)/ []  [facility]/ [] Home Care/ [] Refused Recommended Disposition /[] [facility]/ []  Follow-up with PCP Additional Notes: Pt was hospitalized in May for Pneumonia and sepsis. Pt states that she never quite recovered. She states she has a lot of chest congestion, has chest tightness, anf fatigue. BP is 116/80 HR 99. Pt sounds a bit groggy. Pt will call ems to go to ED.    Summary: Appt request   Pt called the community line reporting that she potentially has pneumonia again. She wants to have imaging to check her chest/lungs.  Has questions for nurse  Best contact: [phone]         Reason for Disposition  Difficulty breathing  Answer Assessment - Initial Assessment Questions 1. LOCATION: "Where does it hurt?"       Pt has chest heaviness  3. ONSET: "When did the chest pain begin?" (Minutes, hours or days)      Ongoing 7. CARDIAC RISK FACTORS: "Do you have any history of heart problems or risk factors for heart disease?" (e.g., angina, prior heart attack; diabetes, high blood pressure, high cholesterol, smoker, or strong family history of heart disease)     HTN, palpatations 8. PULMONARY RISK FACTORS: "Do you have any history of lung disease?"  (e.g., blood clots in lung, asthma, emphysema, birth control pills)     Recent pneumonia - sepsis 9. CAUSE: "What do you think is causing the chest pain?"     Pneumonia 10. OTHER SYMPTOMS: "Do you have any other symptoms?" (e.g., dizziness, nausea, vomiting, sweating, fever, difficulty breathing, cough)       Coughing thick mucous, fatigue  Protocols used: Chest Pain-A-AH

## 2023-03-06 NOTE — ED Notes (Signed)
Pt is now awake, a&ox4, remains with mottled skin. Pt states she feels about the same as when she first came in and now has a HA. Denies n/v/light sensitivity.

## 2023-03-07 ENCOUNTER — Other Ambulatory Visit (HOSPITAL_COMMUNITY): Payer: Self-pay

## 2023-03-07 DIAGNOSIS — J9601 Acute respiratory failure with hypoxia: Secondary | ICD-10-CM | POA: Diagnosis not present

## 2023-03-07 DIAGNOSIS — F17210 Nicotine dependence, cigarettes, uncomplicated: Secondary | ICD-10-CM | POA: Diagnosis not present

## 2023-03-07 MED ORDER — BUDESONIDE-FORMOTEROL FUMARATE 160-4.5 MCG/ACT IN AERO
2.0000 | INHALATION_SPRAY | Freq: Every day | RESPIRATORY_TRACT | 12 refills | Status: DC | PRN
Start: 2023-03-07 — End: 2023-04-30
  Filled 2023-03-07: qty 1, fill #0

## 2023-03-07 MED ORDER — ALBUTEROL SULFATE HFA 108 (90 BASE) MCG/ACT IN AERS
2.0000 | INHALATION_SPRAY | Freq: Four times a day (QID) | RESPIRATORY_TRACT | 2 refills | Status: AC | PRN
  Filled 2023-03-07: qty 6.7, 30d supply, fill #0

## 2023-03-07 NOTE — Discharge Summary (Signed)
Name: Shannon Spears MRN: [id number] DOB: 1980-12-14 42 y.o. PCP: Shannon Spears  Date of Admission: 03/06/2023  4:13 PM Date of Discharge: 03/07/2023 Attending Physician: Shannon Rung, DO  Discharge Diagnosis: 1. Acute Hypoxic Respiratory Failure Principal Problem:   Acute hypoxic respiratory failure [facility])   Discharge Medications: Allergies as of 03/07/2023       Reactions   Buprenorphine Shortness Of Breath, Rash   Breathing problems   Penicillins Anaphylaxis, Nausea And Vomiting, Swelling   TOLERATES ANCEF   Gabapentin Nausea And Vomiting   Nsaids    Renal insufficiency   Propoxyphene Nausea Only   darvocet   Tetanus Toxoids    Blister on legs, MRSA   Adhesive [tape] Dermatitis   Codeine    Mouth went numb   Ibuprofen    Has kidney disease.   Doxycycline Rash        Medication List     TAKE these medications    albuterol 108 (90 Base) MCG/ACT inhaler Commonly known as: VENTOLIN HFA Inhale 2 puffs into the lungs every 6 (six) hours as needed for wheezing or shortness of breath. What changed: Another medication with the same name was removed. Continue taking this medication, and follow the directions you see here.   azelastine 0.1 % nasal spray Commonly known as: ASTELIN Place 1 spray into both nostrils 2 (two) times daily.   budesonide-formoterol 160-4.5 MCG/ACT inhaler Commonly known as: Symbicort Inhale 2 puffs into the lungs daily as needed.   CALCIUM-VITAMIN D PO Take 1 tablet by mouth daily.   cyclobenzaprine 10 MG tablet Commonly known as: FLEXERIL Take 1 tablet (10 mg total) by mouth 3 (three) times daily as needed for muscle spasms.   diazepam 10 MG tablet Commonly known as: VALIUM Take 1 tablet (10 mg total) by mouth 3 (three) times daily as needed for anxiety.   hydrOXYzine 25 MG tablet Commonly known as: ATARAX TAKE 1 TABLET BY MOUTH UP TO 6 TIMES PERDAY AS NEEDED FOR ANXIETY, ITCHING, AND INSOMNIA What changed:  how much to  take how to take this when to take this   lamoTRIgine 200 MG tablet Commonly known as: LAMICTAL Take 1 tablet (200 mg total) by mouth at bedtime.   levETIRAcetam 500 MG tablet Commonly known as: KEPPRA Take 1,000 mg by mouth 2 (two) times daily.   levothyroxine 25 MCG tablet Commonly known as: SYNTHROID Take 25 mcg by mouth daily.   methocarbamol 750 MG tablet Commonly known as: Robaxin-750 Take 1 tablet (750 mg total) by mouth 2 (two) times daily as needed for muscle spasms.   metoprolol succinate 25 MG 24 hr tablet Commonly known as: TOPROL-XL Take 37.5 mg by mouth at bedtime.   montelukast 10 MG tablet Commonly known as: SINGULAIR Take 10 mg by mouth daily.   ondansetron 4 MG disintegrating tablet Commonly known as: ZOFRAN-ODT Take 1 tablet (4 mg total) by mouth every 8 (eight) hours as needed for nausea or vomiting.   Pepcid AC Maximum Strength 20 MG tablet Generic drug: famotidine Take 20 mg by mouth 2 (two) times daily.   PROBIOTIC ACIDOPHILUS PO Take 1 capsule by mouth daily.   Vitamin D (Ergocalciferol) 50000 units Caps Take 1 capsule by mouth once a week. Thursday        Disposition and follow-up:   Ms.Shannon Spears was discharged from [facility] in Stable condition.  At the hospital follow up visit please address:  1.  Acute Hypoxic Respiratory Failure:  Please ensure patient has not had further dyspnea, has been taking asthma medications 2. Tachycardia: Please ensure patient has not been having chest pain, dizziness, syncope.  3. Bipolar disorder: Check for mood lability, continues to take medications.  4. Pending labs/ test needing follow-up: none 5. Labs / imaging needed at time of follow-up: none  Follow-up Appointments: Monday Aug 5 with PCP Shannon Spears, [facility] Course by problem list: Principal Problem:   Acute hypoxic respiratory failure (HCC)   # Acute hypoxic respiratory failure #CAP vs Asthma  Patient  presenting to the ED with several days of dyspnea, productive cough, generalized weakness. This occurred in the setting of recent admission for CAP, followed by outpatient antibiotic course for continued symptoms. In the ED, patient's vitals were stable with oxygen desaturation to the 80's with ambulation. CXR showed bilateral lung base opacities. CBC showed no leukocytosis. Covid test was negative. Patient received one dose of vancomycin and cefepime. Overnight, patient was maintaining O2 sats on room air. Ambulatory pulse ox test showed no desaturation. On reassessment, patient was breathing comfortably without further symptoms. We did not feel further antibiotics were required in the setting of likely viral pneumonia vs asthma exacerbation. Patient was delivered albuterol and Symbicort inhaler and prescription for Singulair was refilled.   #Tachycardia  Patient presenting with tachycardia to the 110s. This is a chronic problem, patient sees cardiology at [facility] in [facility]. Cardiology recently added ivabradine but has not picked up prescription. Patient was continued on home dose of metoprolol succinate 37.5 daily  #Subclinical hypothyroidism  01/2023 TSH of 5.1 and normal free T4. Home synthroid was held in the setting of tachycardia and dyspnea  #Nausea  Nausea without vomiting, fever, changes in bowel/bladder. Treated with po Zofran  #Elevated Alkaline Phosphatase  Appears chronic and mild, likely not contributing to acute illness  #GERD  Home Pepcid 20mg  given  #Bipolar Disorder  Mood stable at this time. Home Diazepam 10mg  prn, hydroxyzine 25mg  prn, lamotrigine 200mg , Keppra 1000mg  given.   #Chronic R leg pain with muscle spasms  Home flexeril given  Discharge Vitals:   BP 100/72   Pulse (!) 105   Temp 97.8 F (36.6 C)   Resp (!) 21   Ht 5\' 1"  (1.549 m)   Wt 81.6 kg   LMP 10/02/2012   SpO2 97%   BMI 34.01 kg/m   Pertinent Labs, Studies, and Procedures:      Latest Ref Rng & Units 03/07/2023    2:56 AM 03/06/2023    5:01 PM 03/06/2023    4:50 PM  CBC  WBC 4.0 - 10.5 K/uL 10.9   8.9   Hemoglobin 12.0 - 15.0 g/dL 40.3  47.4  25.9   Hematocrit 36.0 - 46.0 % 38.4  43.0  42.5   Platelets 150 - 400 K/uL 460   477       Latest Ref Rng & Units 03/07/2023    2:56 AM 03/06/2023    5:01 PM 03/06/2023    4:50 PM  CMP  Glucose 70 - 99 mg/dL 563   97   BUN 6 - 20 mg/dL 10   10   Creatinine 8.75 - 1.00 mg/dL 6.43   3.29   Sodium 518 - 145 mmol/L 138  140  137   Potassium 3.5 - 5.1 mmol/L 4.9  4.0  3.9   Chloride 98 - 111 mmol/L 102   99   CO2 22 - 32 mmol/L 23  25   Calcium 8.9 - 10.3 mg/dL 8.8   8.8   Total Protein 6.5 - 8.1 g/dL 6.8   7.4   Total Bilirubin 0.3 - 1.2 mg/dL 0.2   <4.4   Alkaline Phos 38 - 126 U/L 136   154   AST 15 - 41 U/L 18   21   ALT 0 - 44 U/L 23   23    CT Angio Chest Pulmonary Embolism (PE) W or WO Contrast  Result Date: 03/06/2023 CLINICAL DATA:  High probability for PE EXAM: CT ANGIOGRAPHY CHEST WITH CONTRAST TECHNIQUE: Multidetector CT imaging of the chest was performed using the standard protocol during bolus administration of intravenous contrast. Multiplanar CT image reconstructions and MIPs were obtained to evaluate the vascular anatomy. RADIATION DOSE REDUCTION: This exam was performed according to the departmental dose-optimization program which includes automated exposure control, adjustment of the mA and/or kV according to patient size and/or use of iterative reconstruction technique. CONTRAST:  75mL OMNIPAQUE IOHEXOL 350 MG/ML SOLN COMPARISON:  CT angiogram chest 01/18/2023 FINDINGS: Cardiovascular: Satisfactory opacification of the pulmonary arteries to the segmental level. No evidence of pulmonary embolism. Normal heart size. No pericardial effusion. Mediastinum/Nodes: No enlarged mediastinal, hilar, or axillary lymph nodes. Thyroid gland, trachea, and esophagus demonstrate no significant findings. Lungs/Pleura: There are  bands of atelectasis/airspace disease in the bilateral lower lobes, lingula and right middle lobe. These have mildly increased on the right. Additional scattered multifocal ground-glass opacities are seen in both mid and lower lungs, mildly increased. There is no pleural effusion or pneumothorax. Upper Abdomen: No acute abnormality. Musculoskeletal: No chest wall abnormality. No acute or significant osseous findings. Review of the MIP images confirms the above findings. IMPRESSION: 1. No evidence for pulmonary embolism. 2. Mild increase in bilateral lower lobe, right middle lobe and lingular atelectasis/airspace disease. 3. Mild increase in scattered multifocal ground-glass opacities in the mid and lower lungs. Findings are nonspecific and may be infectious/inflammatory. Electronically Signed   By: Darliss Cheney M.D.   On: 03/06/2023 18:40   DG Chest Portable 1 View  Result Date: 03/06/2023 CLINICAL DATA:  Shortness of breath EXAM: PORTABLE CHEST 1 VIEW COMPARISON:  01/18/2023 FINDINGS: Underinflation. There is some mild opacity seen along bases, left-greater-than-right. No pneumothorax or effusion. No edema. Normal cardiopericardial silhouette. Overlapping cardiac leads. Fixation hardware along the lower cervical spine. IMPRESSION: Underinflation with some lung base opacities, left-greater-than-right. Possible infiltrate on the left and more atelectasis on the right. Recommend follow-up to confirm clearance. Electronically Signed   By: Karen Kays M.D.   On: 03/06/2023 17:44   XR HIP UNILAT W OR W/O PELVIS 2-3 VIEWS RIGHT  Result Date: 02/11/2023 X-rays demonstrate stable alignment of the hardware without complication    Discharge Instructions: Discharge Instructions     Diet - low sodium heart healthy   Complete by: As directed    Increase activity slowly   Complete by: As directed        Signed: Monna Fam, MD 03/07/2023, 1:22 PM   Pager: [phone]

## 2023-03-07 NOTE — ED Notes (Signed)
ED TO INPATIENT HANDOFF REPORT  ED Nurse Name and Phone #: Cat [phone]  S Name/Age/Gender Shannon Spears 42 y.o. female Room/Bed: 010C/010C  Code Status   Code Status: Full Code  Home/SNF/Other Home Patient oriented to: self, place, time, and situation Is this baseline? Yes   Triage Complete: Triage complete  Chief Complaint Acute hypoxic respiratory failure (HCC) [J96.01]  Triage Note No notes on file   Allergies Allergies  Allergen Reactions   Buprenorphine Shortness Of Breath and Rash    Breathing problems   Penicillins Anaphylaxis, Nausea And Vomiting and Swelling    TOLERATES ANCEF   Gabapentin Nausea And Vomiting   Nsaids     Renal insufficiency   Propoxyphene Nausea Only    darvocet   Tetanus Toxoids     Blister on legs, MRSA   Adhesive [Tape] Dermatitis   Codeine     Mouth went numb   Ibuprofen     Has kidney disease.   Doxycycline Rash    Level of Care/Admitting Diagnosis ED Disposition     ED Disposition  Admit   Condition  --   Comment  Hospital Area: [facility] [[id number]]  Level of Care: Telemetry Medical [104]  May place patient in observation at [facility] or [facility] if equivalent level of care is available:: Yes  Covid Evaluation: Confirmed COVID Negative  Diagnosis: Acute hypoxic respiratory failure [facility]) [[id number]]  Admitting Physician: Silvio Pate  Attending Physician: Gust Rung [[id number]]          B Medical/Surgery History Past Medical History:  Diagnosis Date   Anxiety    Arthritis    Bipolar 1 disorder, manic, mild (HCC)    CKD (chronic kidney disease)    Depression    Fibromyalgia    GERD (gastroesophageal reflux disease)    Headache(784.0)    Palpitations    Past Surgical History:  Procedure Laterality Date   ANTERIOR CERVICAL DECOMP/DISCECTOMY FUSION N/A 10/02/2012   Procedure: C4-5, C5-6 anterior cervical discectomy and fusion, allograft plate;  Surgeon: Eldred Manges, MD;   Location: [facility] OR;  Service: Orthopedics;  Laterality: N/A;   CHOLECYSTECTOMY     CYSTOGRAPHY     HIP PINNING,CANNULATED Right 11/22/2022   Procedure: RIGHT HIP PINNING;  Surgeon: Tarry Kos, MD;  Location: [facility] OR;  Service: Orthopedics;  Laterality: Right;   LAPAROSCOPIC TOTAL HYSTERECTOMY     OOPHORECTOMY     TUBAL LIGATION       A IV Location/Drains/Wounds Patient Lines/Drains/Airways Status     Active Line/Drains/Airways     Name Placement date Placement time Site Days   Peripheral IV 03/06/23 22 G Anterior;Left Hand 03/06/23  --  Hand  1   Peripheral IV 03/06/23 20 G Left Antecubital 03/06/23  1650  Antecubital  1            Intake/Output Last 24 hours  Intake/Output Summary (Last 24 hours) at 03/07/2023 1246 Last data filed at 03/07/2023 3329 Gross per 24 hour  Intake 300.48 ml  Output --  Net 300.48 ml    Labs/Imaging Results for orders placed or performed during the hospital encounter of 03/06/23 (from the past 48 hour(s))  SARS Coronavirus 2 by RT PCR (hospital order, performed in [facility] hospital lab) *cepheid single result test* Anterior Nasal Swab     Status: None   Collection Time: 03/06/23  4:25 PM   Specimen: Anterior Nasal Swab  Result Value Ref Range   SARS Coronavirus  2 by RT PCR NEGATIVE NEGATIVE    Comment: Performed at [facility] Lab, [address] [place], [place] [address]  CBC with Differential     Status: Abnormal   Collection Time: 03/06/23  4:50 PM  Result Value Ref Range   WBC 8.9 4.0 - 10.5 K/uL   RBC 4.71 3.87 - 5.11 MIL/uL   Hemoglobin 13.8 12.0 - 15.0 g/dL   HCT 60.4 54.0 - 98.1 %   MCV 90.2 80.0 - 100.0 fL   MCH 29.3 26.0 - 34.0 pg   MCHC 32.5 30.0 - 36.0 g/dL   RDW 19.1 47.8 - 29.5 %   Platelets 477 (H) 150 - 400 K/uL   nRBC 0.0 0.0 - 0.2 %   Neutrophils Relative % 63 %   Neutro Abs 5.6 1.7 - 7.7 K/uL   Lymphocytes Relative 29 %   Lymphs Abs 2.6 0.7 - 4.0 K/uL   Monocytes Relative 4 %   Monocytes Absolute 0.3 0.1 -  1.0 K/uL   Eosinophils Relative 3 %   Eosinophils Absolute 0.2 0.0 - 0.5 K/uL   Basophils Relative 1 %   Basophils Absolute 0.1 0.0 - 0.1 K/uL   Immature Granulocytes 0 %   Abs Immature Granulocytes 0.02 0.00 - 0.07 K/uL    Comment: Performed at [facility], [address] [place], [place] [address]  Comprehensive metabolic panel     Status: Abnormal   Collection Time: 03/06/23  4:50 PM  Result Value Ref Range   Sodium 137 135 - 145 mmol/L   Potassium 3.9 3.5 - 5.1 mmol/L   Chloride 99 98 - 111 mmol/L   CO2 25 22 - 32 mmol/L   Glucose, Bld 97 70 - 99 mg/dL    Comment: Glucose reference range applies only to samples taken after fasting for at least 8 hours.   BUN 10 6 - 20 mg/dL   Creatinine, Ser 8.65 0.44 - 1.00 mg/dL   Calcium 8.8 (L) 8.9 - 10.3 mg/dL   Total Protein 7.4 6.5 - 8.1 g/dL   Albumin 3.3 (L) 3.5 - 5.0 g/dL   AST 21 15 - 41 U/L   ALT 23 0 - 44 U/L   Alkaline Phosphatase 154 (H) 38 - 126 U/L   Total Bilirubin <0.1 (L) 0.3 - 1.2 mg/dL   GFR, Estimated >78 >46 mL/min    Comment: (NOTE) Calculated using the CKD-EPI Creatinine Equation (2021)    Anion gap 13 5 - 15    Comment: Performed at [facility] Lab, [address], [place], [place] [address]  Troponin I (High Sensitivity)     Status: None   Collection Time: 03/06/23  4:50 PM  Result Value Ref Range   Troponin I (High Sensitivity) 5 <18 ng/L    Comment: (NOTE) Elevated high sensitivity troponin I (hsTnI) values and significant  changes across serial measurements may suggest ACS but many other  chronic and acute conditions are known to elevate hsTnI results.  Refer to the "Links" section for chest pain algorithms and additional  guidance. Performed at [facility], [address], [place], [place] [address]   I-Stat venous blood gas, ED     Status: Abnormal   Collection Time: 03/06/23  5:01 PM  Result Value Ref Range   pH, Ven 7.364 7.25 - 7.43   pCO2, Ven 48.9 44 - 60 mmHg   pO2, Ven 30  (LL) 32 - 45 mmHg   Bicarbonate 27.9 20.0 -  28.0 mmol/L   TCO2 29 22 - 32 mmol/L   O2 Saturation 55 %   Acid-Base Excess 2.0 0.0 - 2.0 mmol/L   Sodium 140 135 - 145 mmol/L   Potassium 4.0 3.5 - 5.1 mmol/L   Calcium, Ion 1.11 (L) 1.15 - 1.40 mmol/L   HCT 43.0 36.0 - 46.0 %   Hemoglobin 14.6 12.0 - 15.0 g/dL   Sample type VENOUS    Comment NOTIFIED PHYSICIAN   hCG, quantitative, pregnancy     Status: Abnormal   Collection Time: 03/06/23  6:40 PM  Result Value Ref Range   hCG, Beta Chain, Quant, S 5 (H) <5 mIU/mL    Comment:          GEST. AGE      CONC.  (mIU/mL)   <=1 WEEK        5 - 50     2 WEEKS       50 - 500     3 WEEKS       100 - 10,000     4 WEEKS     1,000 - 30,000     5 WEEKS     3,500 - 115,000   6-8 WEEKS     12,000 - 270,000    12 WEEKS     15,000 - 220,000        FEMALE AND NON-PREGNANT FEMALE:     LESS THAN 5 mIU/mL Performed at [facility], [address], [place], [place] [address]   Troponin I (High Sensitivity)     Status: None   Collection Time: 03/06/23  6:40 PM  Result Value Ref Range   Troponin I (High Sensitivity) 3 <18 ng/L    Comment: (NOTE) Elevated high sensitivity troponin I (hsTnI) values and significant  changes across serial measurements may suggest ACS but many other  chronic and acute conditions are known to elevate hsTnI results.  Refer to the "Links" section for chest pain algorithms and additional  guidance. Performed at [facility], [address] [place], [place] [address]   Procalcitonin     Status: None   Collection Time: 03/06/23  6:40 PM  Result Value Ref Range   Procalcitonin <0.10 ng/mL    Comment:        Interpretation: PCT (Procalcitonin) <= 0.5 ng/mL: Systemic infection (sepsis) is not likely. Local bacterial infection is possible. (NOTE)       Sepsis PCT Algorithm           Lower Respiratory Tract                                      Infection PCT Algorithm    ----------------------------      ----------------------------         PCT < 0.25 ng/mL                PCT < 0.10 ng/mL          Strongly encourage             Strongly discourage   discontinuation of antibiotics    initiation of antibiotics    ----------------------------     -----------------------------       PCT 0.25 - 0.50 ng/mL            PCT 0.10 - 0.25 ng/mL  OR       >80% decrease in PCT            Discourage initiation of                                            antibiotics      Encourage discontinuation           of antibiotics    ----------------------------     -----------------------------         PCT >= 0.50 ng/mL              PCT 0.26 - 0.50 ng/mL               AND        <80% decrease in PCT             Encourage initiation of                                             antibiotics       Encourage continuation           of antibiotics    ----------------------------     -----------------------------        PCT >= 0.50 ng/mL                  PCT > 0.50 ng/mL               AND         increase in PCT                  Strongly encourage                                      initiation of antibiotics    Strongly encourage escalation           of antibiotics                                     -----------------------------                                           PCT <= 0.25 ng/mL                                                 OR                                        > 80% decrease in PCT                                      Discontinue / Do not initiate  antibiotics  Performed at [facility], [address] [place], [place] [address]   Respiratory (~20 pathogens) panel by PCR     Status: None   Collection Time: 03/06/23 10:04 PM   Specimen: Nasopharyngeal Swab; Respiratory  Result Value Ref Range   Adenovirus NOT DETECTED NOT DETECTED   Coronavirus 229E NOT DETECTED NOT DETECTED    Comment: (NOTE) The Coronavirus on the  Respiratory Panel, DOES NOT test for the novel  Coronavirus (2019 nCoV)    Coronavirus HKU1 NOT DETECTED NOT DETECTED   Coronavirus NL63 NOT DETECTED NOT DETECTED   Coronavirus OC43 NOT DETECTED NOT DETECTED   Metapneumovirus NOT DETECTED NOT DETECTED   Rhinovirus / Enterovirus NOT DETECTED NOT DETECTED   Influenza A NOT DETECTED NOT DETECTED   Influenza B NOT DETECTED NOT DETECTED   Parainfluenza Virus 1 NOT DETECTED NOT DETECTED   Parainfluenza Virus 2 NOT DETECTED NOT DETECTED   Parainfluenza Virus 3 NOT DETECTED NOT DETECTED   Parainfluenza Virus 4 NOT DETECTED NOT DETECTED   Respiratory Syncytial Virus NOT DETECTED NOT DETECTED   Bordetella pertussis NOT DETECTED NOT DETECTED   Bordetella Parapertussis NOT DETECTED NOT DETECTED   Chlamydophila pneumoniae NOT DETECTED NOT DETECTED   Mycoplasma pneumoniae NOT DETECTED NOT DETECTED    Comment: Performed at [facility], [address] [place], [place] [address]  Blood culture (routine x 2)     Status: None (Preliminary result)   Collection Time: 03/06/23 10:06 PM   Specimen: BLOOD  Result Value Ref Range   Specimen Description BLOOD SITE NOT SPECIFIED    Special Requests      BOTTLES DRAWN AEROBIC AND ANAEROBIC Blood Culture adequate volume   Culture      NO GROWTH < 12 HOURS Performed at [facility], [address], [place], [place] [address]    Report Status PENDING   HIV Antibody (routine testing w rflx)     Status: None   Collection Time: 03/07/23  2:56 AM  Result Value Ref Range   HIV Screen 4th Generation wRfx Non Reactive Non Reactive    Comment: Performed at [facility], [address], [place], [place] [address]  CBC with Differential/Platelet     Status: Abnormal   Collection Time: 03/07/23  2:56 AM  Result Value Ref Range   WBC 10.9 (H) 4.0 - 10.5 K/uL   RBC 4.37 3.87 - 5.11 MIL/uL   Hemoglobin 12.4 12.0 - 15.0 g/dL   HCT 29.5 62.1 - 30.8 %   MCV 87.9 80.0 - 100.0 fL   MCH 28.4 26.0 -  34.0 pg   MCHC 32.3 30.0 - 36.0 g/dL   RDW 65.7 84.6 - 96.2 %   Platelets 460 (H) 150 - 400 K/uL   nRBC 0.0 0.0 - 0.2 %   Neutrophils Relative % 87 %   Neutro Abs 9.6 (H) 1.7 - 7.7 K/uL   Lymphocytes Relative 11 %   Lymphs Abs 1.2 0.7 - 4.0 K/uL   Monocytes Relative 1 %   Monocytes Absolute 0.1 0.1 - 1.0 K/uL   Eosinophils Relative 0 %   Eosinophils Absolute 0.0 0.0 - 0.5 K/uL   Basophils Relative 0 %   Basophils Absolute 0.0 0.0 - 0.1 K/uL   Immature Granulocytes 1 %   Abs Immature Granulocytes 0.05 0.00 - 0.07 K/uL    Comment: Performed at [facility], [address] [place], [place] [address]  Comprehensive metabolic panel  Status: Abnormal   Collection Time: 03/07/23  2:56 AM  Result Value Ref Range   Sodium 138 135 - 145 mmol/L   Potassium 4.9 3.5 - 5.1 mmol/L   Chloride 102 98 - 111 mmol/L   CO2 23 22 - 32 mmol/L   Glucose, Bld 132 (H) 70 - 99 mg/dL    Comment: Glucose reference range applies only to samples taken after fasting for at least 8 hours.   BUN 10 6 - 20 mg/dL   Creatinine, Ser 1.61 0.44 - 1.00 mg/dL   Calcium 8.8 (L) 8.9 - 10.3 mg/dL   Total Protein 6.8 6.5 - 8.1 g/dL   Albumin 2.9 (L) 3.5 - 5.0 g/dL   AST 18 15 - 41 U/L   ALT 23 0 - 44 U/L   Alkaline Phosphatase 136 (H) 38 - 126 U/L   Total Bilirubin 0.2 (L) 0.3 - 1.2 mg/dL   GFR, Estimated >09 >60 mL/min    Comment: (NOTE) Calculated using the CKD-EPI Creatinine Equation (2021)    Anion gap 13 5 - 15    Comment: Performed at [facility] Lab, [address], [place], [place] [address]   CT Angio Chest Pulmonary Embolism (PE) W or WO Contrast  Result Date: 03/06/2023 CLINICAL DATA:  High probability for PE EXAM: CT ANGIOGRAPHY CHEST WITH CONTRAST TECHNIQUE: Multidetector CT imaging of the chest was performed using the standard protocol during bolus administration of intravenous contrast. Multiplanar CT image reconstructions and MIPs were obtained to evaluate the vascular anatomy. RADIATION  DOSE REDUCTION: This exam was performed according to the departmental dose-optimization program which includes automated exposure control, adjustment of the mA and/or kV according to patient size and/or use of iterative reconstruction technique. CONTRAST:  75mL OMNIPAQUE IOHEXOL 350 MG/ML SOLN COMPARISON:  CT angiogram chest 01/18/2023 FINDINGS: Cardiovascular: Satisfactory opacification of the pulmonary arteries to the segmental level. No evidence of pulmonary embolism. Normal heart size. No pericardial effusion. Mediastinum/Nodes: No enlarged mediastinal, hilar, or axillary lymph nodes. Thyroid gland, trachea, and esophagus demonstrate no significant findings. Lungs/Pleura: There are bands of atelectasis/airspace disease in the bilateral lower lobes, lingula and right middle lobe. These have mildly increased on the right. Additional scattered multifocal ground-glass opacities are seen in both mid and lower lungs, mildly increased. There is no pleural effusion or pneumothorax. Upper Abdomen: No acute abnormality. Musculoskeletal: No chest wall abnormality. No acute or significant osseous findings. Review of the MIP images confirms the above findings. IMPRESSION: 1. No evidence for pulmonary embolism. 2. Mild increase in bilateral lower lobe, right middle lobe and lingular atelectasis/airspace disease. 3. Mild increase in scattered multifocal ground-glass opacities in the mid and lower lungs. Findings are nonspecific and may be infectious/inflammatory. Electronically Signed   By: Darliss Cheney M.D.   On: 03/06/2023 18:40   DG Chest Portable 1 View  Result Date: 03/06/2023 CLINICAL DATA:  Shortness of breath EXAM: PORTABLE CHEST 1 VIEW COMPARISON:  01/18/2023 FINDINGS: Underinflation. There is some mild opacity seen along bases, left-greater-than-right. No pneumothorax or effusion. No edema. Normal cardiopericardial silhouette. Overlapping cardiac leads. Fixation hardware along the lower cervical spine.  IMPRESSION: Underinflation with some lung base opacities, left-greater-than-right. Possible infiltrate on the left and more atelectasis on the right. Recommend follow-up to confirm clearance. Electronically Signed   By: Karen Kays M.D.   On: 03/06/2023 17:44    Pending Labs Unresulted Labs (From admission, onward)     Start     Ordered   03/06/23 2030  Expectorated Sputum  Assessment w Gram Stain, Rflx to Resp Cult  Once,   R        03/06/23 2029   03/06/23 2029  Blood culture (routine x 2)  BLOOD CULTURE X 2,   R (with STAT occurrences)      03/06/23 2029            Vitals/Pain Today's Vitals   03/07/23 0840 03/07/23 1010 03/07/23 1012 03/07/23 1017  BP:  96/66 98/76   Pulse:   (!) 105   Resp:  15    Temp:      TempSrc:      SpO2:      Weight:      Height:      PainSc: 0-No pain   9     Isolation Precautions Droplet precaution  Medications Medications  metoprolol succinate (TOPROL-XL) 24 hr tablet 25 mg (25 mg Oral Given 03/07/23 1012)  diazepam (VALIUM) tablet 10 mg (10 mg Oral Given 03/06/23 2346)  hydrOXYzine (ATARAX) tablet 25 mg (25 mg Oral Given 03/06/23 2346)  famotidine (PEPCID) tablet 20 mg (20 mg Oral Given 03/07/23 1012)  ondansetron (ZOFRAN-ODT) disintegrating tablet 4 mg (has no administration in time range)  cyclobenzaprine (FLEXERIL) tablet 10 mg (has no administration in time range)  lamoTRIgine (LAMICTAL) tablet 200 mg (200 mg Oral Given 03/06/23 2347)  levETIRAcetam (KEPPRA) tablet 1,000 mg (1,000 mg Oral Given 03/07/23 1012)  calcium-vitamin D (OSCAL WITH D) 500-5 MG-MCG per tablet 1 tablet (1 tablet Oral Given 03/07/23 1014)  montelukast (SINGULAIR) tablet 10 mg (10 mg Oral Given 03/07/23 1012)  ipratropium-albuterol (DUONEB) 0.5-2.5 (3) MG/3ML nebulizer solution 3 mL (has no administration in time range)  loratadine (CLARITIN) tablet 10 mg (10 mg Oral Given 03/07/23 1012)  azelastine (ASTELIN) 0.1 % nasal spray 1 spray (1 spray Each Nare Given 03/07/23 1013)   enoxaparin (LOVENOX) injection 40 mg (40 mg Subcutaneous Given 03/07/23 1014)  mometasone-formoterol (DULERA) 100-5 MCG/ACT inhaler 2 puff (2 puffs Inhalation Not Given 03/07/23 0805)  lactated ringers bolus 1,000 mL (0 mLs Intravenous Stopped 03/06/23 2021)  iohexol (OMNIPAQUE) 350 MG/ML injection 75 mL (75 mLs Intravenous Contrast Given 03/06/23 1832)  vancomycin (VANCOREADY) IVPB 1500 mg/300 mL (0 mg Intravenous Stopped 03/07/23 0042)  ceFEPIme (MAXIPIME) 2 g in sodium chloride 0.9 % 100 mL IVPB (0 g Intravenous Stopped 03/06/23 2237)    Mobility walks     Focused Assessments     R Recommendations: See Admitting Provider Note  Report given to:   Additional Notes:

## 2023-03-07 NOTE — Discharge Instructions (Signed)
It was a pleasure taking care of you in the hospital. You were brought for an asthma attack, which was likely caused by a virus. We are discharging you with an inhaler, symbicort, which you should use daily, as well as albuterol for rescue inhaler. Please make an appointment with your primary care provider as soon as you can

## 2023-03-07 NOTE — ED Notes (Signed)
Morning providers at the bedside

## 2023-03-07 NOTE — ED Notes (Signed)
Pt pulse ox to start was 96, while ambulating it stayed between 90-92.

## 2023-03-24 ENCOUNTER — Encounter: Payer: Self-pay | Admitting: Adult Health

## 2023-03-24 ENCOUNTER — Ambulatory Visit: Payer: Commercial Managed Care - HMO | Admitting: Adult Health

## 2023-03-24 DIAGNOSIS — F451 Undifferentiated somatoform disorder: Secondary | ICD-10-CM

## 2023-03-24 DIAGNOSIS — F411 Generalized anxiety disorder: Secondary | ICD-10-CM | POA: Diagnosis not present

## 2023-03-24 DIAGNOSIS — F3175 Bipolar disorder, in partial remission, most recent episode depressed: Secondary | ICD-10-CM | POA: Diagnosis not present

## 2023-03-24 DIAGNOSIS — F4312 Post-traumatic stress disorder, chronic: Secondary | ICD-10-CM | POA: Diagnosis not present

## 2023-03-24 MED ORDER — LAMOTRIGINE 200 MG PO TABS
200.0000 mg | ORAL_TABLET | Freq: Every day | ORAL | 5 refills | Status: DC
Start: 2023-03-24 — End: 2023-06-24

## 2023-03-24 MED ORDER — DIAZEPAM 10 MG PO TABS: 10.0000 mg | ORAL_TABLET | Freq: Three times a day (TID) | ORAL | 2 refills | Status: DC | PRN

## 2023-03-24 MED ORDER — HYDROXYZINE HCL 25 MG PO TABS
ORAL_TABLET | ORAL | 2 refills | Status: DC
Start: 2023-03-24 — End: 2023-06-19

## 2023-03-24 NOTE — Progress Notes (Signed)
Shannon Spears [id number] 11-06-1980 42 y.o.  Subjective:   Patient ID:  Shannon Spears is a 42 y.o. (DOB 04-18-1981) female.  Chief Complaint: No chief complaint on file.   HPI Shannon Spears presents to the office today for follow-up of BPD-1, PTSD, anxiety, and somatic symptom disorder.  Describes mood today as "ok". Pleasant. Flat.Tearful. Mood symptoms - denies depression. Reports increased anxiety. Reports irritability - "rage". Reports panic attacks. Reports worry, rumination, and over thinking. Reports situational stressors. Reports increased health issues. Reports mood is "mean and agitated". Stating "I have a lot going on". Feels like medications are helpful. Family doing well. Stable interest and motivation. Taking medications as prescribed. Energy levels lower. Active, does not have a regular exercise routine with physical disabilities. Enjoys some usual interests and activities. Married. Lives with husband and daughter. Spending time with family. Appetite adequate. Weight gain.. Sleeps better some nights than others. Averages 6 hours. Focus and concentration difficulties. Completing tasks. Managing aspects of household. Stay at home mom. Denies SI or HI.  Denies AH or VH. Denies self harm.  Denies substance use.  Followed by pain management - [facility].  Previous medication trials: Unknown   GAD-7    Flowsheet Row Office Visit from 02/01/2019 in [facility] at [facility]  Total GAD-7 Score 5      PHQ2-9    Flowsheet Row Office Visit from 02/01/2019 in [facility] at [facility] Total Score 0  PHQ-9 Total Score 5      Flowsheet Row ED from 03/06/2023 in [facility] Emergency Department at [facility] ED to Hosp-Admission (Discharged) from 01/18/2023 in [facility] ED from 01/03/2023 in [facility] Emergency Department at [facility]  C-SSRS RISK CATEGORY No Risk No Risk No Risk         Review of Systems:  Review of Systems  Musculoskeletal:  Negative for gait problem.  Neurological:  Negative for tremors.  Psychiatric/Behavioral:         Please refer to HPI    Medications: I have reviewed the patient's current medications.  Current Outpatient Medications  Medication Sig Dispense Refill   albuterol (VENTOLIN HFA) 108 (90 Base) MCG/ACT inhaler Inhale 2 puffs into the lungs every 6 (six) hours as needed for wheezing or shortness of breath. 6.7 g 2   azelastine (ASTELIN) 0.1 % nasal spray Place 1 spray into both nostrils 2 (two) times daily.     budesonide-formoterol (SYMBICORT) 160-4.5 MCG/ACT inhaler Inhale 2 puffs into the lungs daily as needed. 10.2 g 12   CALCIUM-VITAMIN D PO Take 1 tablet by mouth daily.     cyclobenzaprine (FLEXERIL) 10 MG tablet Take 1 tablet (10 mg total) by mouth 3 (three) times daily as needed for muscle spasms. 60 tablet 1   diazepam (VALIUM) 10 MG tablet Take 1 tablet (10 mg total) by mouth 3 (three) times daily as needed for anxiety. 90 tablet 2   famotidine (PEPCID AC MAXIMUM STRENGTH) 20 MG tablet Take 20 mg by mouth 2 (two) times daily.     hydrOXYzine (ATARAX) 25 MG tablet TAKE 1 TABLET BY MOUTH UP TO 6 TIMES PERDAY AS NEEDED FOR ANXIETY, ITCHING, AND INSOMNIA (Patient taking differently: Take 25 mg by mouth See admin instructions. TAKE 1 TABLET BY MOUTH UP TO 6 TIMES PERDAY AS NEEDED FOR ANXIETY, ITCHING, AND INSOMNIA) 180 tablet 2   Lactobacillus (PROBIOTIC ACIDOPHILUS PO) Take 1 capsule by mouth daily.  lamoTRIgine (LAMICTAL) 200 MG tablet Take 1 tablet (200 mg total) by mouth at bedtime. 30 tablet 5   levETIRAcetam (KEPPRA) 500 MG tablet Take 1,000 mg by mouth 2 (two) times daily.     levothyroxine (SYNTHROID) 25 MCG tablet Take 25 mcg by mouth daily.     methocarbamol (ROBAXIN-750) 750 MG tablet Take 1 tablet (750 mg total) by mouth 2 (two) times daily as needed for muscle spasms. (Patient not taking: Reported on 03/07/2023) 20  tablet 2   metoprolol succinate (TOPROL-XL) 25 MG 24 hr tablet Take 37.5 mg by mouth at bedtime.     montelukast (SINGULAIR) 10 MG tablet Take 10 mg by mouth daily.     ondansetron (ZOFRAN-ODT) 4 MG disintegrating tablet Take 1 tablet (4 mg total) by mouth every 8 (eight) hours as needed for nausea or vomiting. 20 tablet 0   Vitamin D, Ergocalciferol, 50000 units CAPS Take 1 capsule by mouth once a week. Thursday     No current facility-administered medications for this visit.    Medication Side Effects: None  Allergies:  Allergies  Allergen Reactions   Buprenorphine Shortness Of Breath and Rash    Breathing problems   Penicillins Anaphylaxis, Nausea And Vomiting and Swelling    TOLERATES ANCEF   Gabapentin Nausea And Vomiting   Nsaids     Renal insufficiency   Propoxyphene Nausea Only    darvocet   Tetanus Toxoids     Blister on legs, MRSA   Adhesive [Tape] Dermatitis   Codeine     Mouth went numb   Ibuprofen     Has kidney disease.   Doxycycline Rash    Past Medical History:  Diagnosis Date   Anxiety    Arthritis    Bipolar 1 disorder, manic, mild (HCC)    CKD (chronic kidney disease)    Depression    Fibromyalgia    GERD (gastroesophageal reflux disease)    Headache(784.0)    Palpitations     Past Medical History, Surgical history, Social history, and Family history were reviewed and updated as appropriate.   Please see review of systems for further details on the patient's review from today.   Objective:   Physical Exam:  LMP 10/02/2012   Physical Exam Constitutional:      General: She is not in acute distress. Musculoskeletal:        General: No deformity.  Neurological:     Mental Status: She is alert and oriented to person, place, and time.     Coordination: Coordination normal.  Psychiatric:        Attention and Perception: Attention and perception normal. She does not perceive auditory or visual hallucinations.        Mood and Affect: Affect  is not labile, blunt, angry or inappropriate.        Speech: Speech normal.        Behavior: Behavior normal.        Thought Content: Thought content normal. Thought content is not paranoid or delusional. Thought content does not include homicidal or suicidal ideation. Thought content does not include homicidal or suicidal plan.        Cognition and Memory: Cognition and memory normal.        Judgment: Judgment normal.     Comments: Insight intact     Lab Review:     Component Value Date/Time   NA 138 03/07/2023 0256   NA 135 02/01/2019 1012   K 4.9 03/07/2023 0256  CL 102 03/07/2023 0256   CO2 23 03/07/2023 0256   GLUCOSE 132 (H) 03/07/2023 0256   BUN 10 03/07/2023 0256   BUN 8 02/01/2019 1012   CREATININE 0.89 03/07/2023 0256   CALCIUM 8.8 (L) 03/07/2023 0256   PROT 6.8 03/07/2023 0256   PROT 6.9 02/01/2019 1012   ALBUMIN 2.9 (L) 03/07/2023 0256   ALBUMIN 4.1 02/01/2019 1012   AST 18 03/07/2023 0256   ALT 23 03/07/2023 0256   ALKPHOS 136 (H) 03/07/2023 0256   BILITOT 0.2 (L) 03/07/2023 0256   BILITOT 0.3 02/01/2019 1012   GFRNONAA >60 03/07/2023 0256   GFRAA 127 02/01/2019 1012       Component Value Date/Time   WBC 10.9 (H) 03/07/2023 0256   RBC 4.37 03/07/2023 0256   HGB 12.4 03/07/2023 0256   HGB 14.2 02/01/2019 1012   HCT 38.4 03/07/2023 0256   HCT 41.8 02/01/2019 1012   PLT 460 (H) 03/07/2023 0256   PLT 518 (H) 02/01/2019 1012   MCV 87.9 03/07/2023 0256   MCV 96 02/01/2019 1012   MCH 28.4 03/07/2023 0256   MCHC 32.3 03/07/2023 0256   RDW 15.1 03/07/2023 0256   RDW 13.6 02/01/2019 1012   LYMPHSABS 1.2 03/07/2023 0256   LYMPHSABS 3.7 (H) 02/01/2019 1012   MONOABS 0.1 03/07/2023 0256   EOSABS 0.0 03/07/2023 0256   EOSABS 0.3 02/01/2019 1012   BASOSABS 0.0 03/07/2023 0256   BASOSABS 0.1 02/01/2019 1012    No results found for: "POCLITH", "LITHIUM"   No results found for: "PHENYTOIN", "PHENOBARB", "VALPROATE", "CBMZ"   .res Assessment: Plan:      Plan:  PDMP reviewed  1. Lamictal 200mg  daily 2. Valium 10mg  TID - not taking with pain medications 3. Atarax 25mg  up to 6 times daily  RTC 3 months  Patient advised to contact office with any questions, adverse effects, or acute worsening in signs and symptoms.  Discussed potential benefits, risk, and side effects of benzodiazepines to include potential risk of tolerance and dependence, as well as possible drowsiness.  Advised patient not to drive if experiencing drowsiness and to take lowest possible effective dose to minimize risk of dependence and tolerance.  Counseled patient regarding potential benefits, risks, and side effects of Lamictal to include potential risk of Stevens-Johnson syndrome. Advised patient to stop taking Lamictal and contact office immediately if rash develops and to seek urgent medical attention if rash is severe and/or spreading quickly.  There are no diagnoses linked to this encounter.   Please see After Visit Summary for patient specific instructions.  Future Appointments  Date Time Provider Department Center  03/24/2023  1:40 PM Deaundra Dupriest, Thereasa Solo, NP [facility] None  04/08/2023 11:15 AM Nyoka Cowden, MD [facility] None    No orders of the defined types were placed in this encounter.   -------------------------------

## 2023-03-26 ENCOUNTER — Other Ambulatory Visit (HOSPITAL_BASED_OUTPATIENT_CLINIC_OR_DEPARTMENT_OTHER): Payer: Self-pay

## 2023-04-07 NOTE — Progress Notes (Deleted)
Shannon Spears, female    DOB: [birth year]   MRN: [id number]   Brief patient profile:  59 yowf   *** referred to pulmonary clinic 04/08/2023 by *** for ***        History of Present Illness  04/08/2023  Pulmonary/ 1st office eval/Arriyanna Mersch  No chief complaint on file.    Dyspnea:  *** Cough: *** Sleep: *** SABA use:     Outpatient Medications Prior to Visit  Medication Sig Dispense Refill   albuterol (VENTOLIN HFA) 108 (90 Base) MCG/ACT inhaler Inhale 2 puffs into the lungs every 6 (six) hours as needed for wheezing or shortness of breath. 6.7 g 2   azelastine (ASTELIN) 0.1 % nasal spray Place 1 spray into both nostrils 2 (two) times daily.     budesonide-formoterol (SYMBICORT) 160-4.5 MCG/ACT inhaler Inhale 2 puffs into the lungs daily as needed. 10.2 g 12   CALCIUM-VITAMIN D PO Take 1 tablet by mouth daily.     cyclobenzaprine (FLEXERIL) 10 MG tablet Take 1 tablet (10 mg total) by mouth 3 (three) times daily as needed for muscle spasms. 60 tablet 1   diazepam (VALIUM) 10 MG tablet Take 1 tablet (10 mg total) by mouth 3 (three) times daily as needed for anxiety. 90 tablet 2   famotidine (PEPCID AC MAXIMUM STRENGTH) 20 MG tablet Take 20 mg by mouth 2 (two) times daily.     hydrOXYzine (ATARAX) 25 MG tablet TAKE 1 TABLET BY MOUTH UP TO 6 TIMES PERDAY AS NEEDED FOR ANXIETY, ITCHING, AND INSOMNIA 180 tablet 2   Lactobacillus (PROBIOTIC ACIDOPHILUS PO) Take 1 capsule by mouth daily.     lamoTRIgine (LAMICTAL) 200 MG tablet Take 1 tablet (200 mg total) by mouth at bedtime. 30 tablet 5   levETIRAcetam (KEPPRA) 500 MG tablet Take 1,000 mg by mouth 2 (two) times daily.     levothyroxine (SYNTHROID) 25 MCG tablet Take 25 mcg by mouth daily.     methocarbamol (ROBAXIN-750) 750 MG tablet Take 1 tablet (750 mg total) by mouth 2 (two) times daily as needed for muscle spasms. (Patient not taking: Reported on 03/07/2023) 20 tablet 2   metoprolol succinate (TOPROL-XL) 25 MG 24 hr tablet Take 37.5 mg by mouth  at bedtime.     montelukast (SINGULAIR) 10 MG tablet Take 10 mg by mouth daily.     ondansetron (ZOFRAN-ODT) 4 MG disintegrating tablet Take 1 tablet (4 mg total) by mouth every 8 (eight) hours as needed for nausea or vomiting. 20 tablet 0   Vitamin D, Ergocalciferol, 50000 units CAPS Take 1 capsule by mouth once a week. Thursday     No facility-administered medications prior to visit.    Past Medical History:  Diagnosis Date   Anxiety    Arthritis    Bipolar 1 disorder, manic, mild (HCC)    CKD (chronic kidney disease)    Depression    Fibromyalgia    GERD (gastroesophageal reflux disease)    Headache(784.0)    Palpitations       Objective:     LMP 10/02/2012          Assessment   No problem-specific Assessment & Plan notes found for this encounter.     Sandrea Hughs, MD 04/07/2023

## 2023-04-08 ENCOUNTER — Encounter: Payer: Self-pay | Admitting: Internal Medicine

## 2023-04-08 ENCOUNTER — Institutional Professional Consult (permissible substitution): Payer: Commercial Managed Care - HMO | Admitting: Internal Medicine

## 2023-04-20 ENCOUNTER — Encounter: Payer: Self-pay | Admitting: Orthopaedic Surgery

## 2023-04-21 ENCOUNTER — Emergency Department (HOSPITAL_BASED_OUTPATIENT_CLINIC_OR_DEPARTMENT_OTHER): Payer: Commercial Managed Care - HMO

## 2023-04-21 ENCOUNTER — Encounter (HOSPITAL_BASED_OUTPATIENT_CLINIC_OR_DEPARTMENT_OTHER): Payer: Self-pay

## 2023-04-21 ENCOUNTER — Emergency Department (HOSPITAL_BASED_OUTPATIENT_CLINIC_OR_DEPARTMENT_OTHER)
Admission: EM | Admit: 2023-04-21 | Discharge: 2023-04-21 | Disposition: A | Discharge status: Home / Self Care | Payer: Commercial Managed Care - HMO | Attending: Emergency Medicine | Admitting: Emergency Medicine

## 2023-04-21 ENCOUNTER — Encounter: Payer: Self-pay | Admitting: Internal Medicine

## 2023-04-21 ENCOUNTER — Other Ambulatory Visit: Payer: Self-pay

## 2023-04-21 DIAGNOSIS — Z79899 Other long term (current) drug therapy: Secondary | ICD-10-CM | POA: Diagnosis not present

## 2023-04-21 DIAGNOSIS — J40 Bronchitis, not specified as acute or chronic: Secondary | ICD-10-CM | POA: Diagnosis not present

## 2023-04-21 DIAGNOSIS — J45909 Unspecified asthma, uncomplicated: Secondary | ICD-10-CM | POA: Diagnosis not present

## 2023-04-21 DIAGNOSIS — Z20822 Contact with and (suspected) exposure to covid-19: Secondary | ICD-10-CM | POA: Insufficient documentation

## 2023-04-21 DIAGNOSIS — I10 Essential (primary) hypertension: Secondary | ICD-10-CM | POA: Insufficient documentation

## 2023-04-21 DIAGNOSIS — R079 Chest pain, unspecified: Secondary | ICD-10-CM | POA: Diagnosis present

## 2023-04-21 LAB — BASIC METABOLIC PANEL
Anion gap: 10 (ref 5–15)
BUN: 11 mg/dL (ref 6–20)
CO2: 27 mmol/L (ref 22–32)
Calcium: 8.7 mg/dL — ABNORMAL LOW (ref 8.9–10.3)
Chloride: 102 mmol/L (ref 98–111)
Creatinine, Ser: 0.86 mg/dL (ref 0.44–1.00)
GFR, Estimated: >60 mL/min (ref >60)
Glucose, Bld: 106 mg/dL — ABNORMAL HIGH (ref 70–99)
Potassium: 4.1 mmol/L (ref 3.5–5.1)
Sodium: 139 mmol/L (ref 135–145)

## 2023-04-21 LAB — CBC
HCT: 41 % (ref 36.0–46.0)
Hemoglobin: 13.1 g/dL (ref 12.0–15.0)
MCH: 28.4 pg (ref 26.0–34.0)
MCHC: 32 g/dL (ref 30.0–36.0)
MCV: 88.7 fL (ref 80.0–100.0)
Platelets: 437 10*3/uL — ABNORMAL HIGH (ref 150–400)
RBC: 4.62 MIL/uL (ref 3.87–5.11)
RDW: 13.9 % (ref 11.5–15.5)
WBC: 10.3 10*3/uL (ref 4.0–10.5)
nRBC: 0 % (ref 0.0–0.2)

## 2023-04-21 LAB — RESP PANEL BY RT-PCR (RSV, FLU A&B, COVID)  RVPGX2
Influenza A by PCR: NEGATIVE
Influenza B by PCR: NEGATIVE
Resp Syncytial Virus by PCR: NEGATIVE
SARS Coronavirus 2 by RT PCR: NEGATIVE

## 2023-04-21 LAB — D-DIMER, QUANTITATIVE: D-Dimer, Quant: 0.52 ug{FEU}/mL — ABNORMAL HIGH (ref 0.00–0.50)

## 2023-04-21 LAB — TROPONIN I (HIGH SENSITIVITY): Troponin I (High Sensitivity): <2 ng/L (ref <18)

## 2023-04-21 MED ORDER — METHYLPREDNISOLONE SODIUM SUCC 125 MG IJ SOLR
125.0000 mg | INTRAMUSCULAR | Status: AC
  Administered 2023-04-21: 125 mg via INTRAVENOUS
  Filled 2023-04-21: qty 2

## 2023-04-21 MED ORDER — IPRATROPIUM-ALBUTEROL 0.5-2.5 (3) MG/3ML IN SOLN
3.0000 mL | RESPIRATORY_TRACT | Status: AC
  Administered 2023-04-21: 3 mL via RESPIRATORY_TRACT
  Filled 2023-04-21: qty 3

## 2023-04-21 MED ORDER — IOHEXOL 350 MG/ML SOLN
75.0000 mL | Freq: Once | INTRAVENOUS | Status: AC | PRN
  Administered 2023-04-21: 75 mL via INTRAVENOUS

## 2023-04-21 MED ORDER — METHYLPREDNISOLONE 4 MG PO TBPK: ORAL_TABLET | Freq: Every day | ORAL | 0 refills | Status: AC

## 2023-04-21 MED ORDER — METHYLPREDNISOLONE 4 MG PO TBPK: ORAL_TABLET | Freq: Every day | ORAL | 0 refills | Status: DC

## 2023-04-21 NOTE — Discharge Instructions (Addendum)
You were seen in the ER for evaluation. Your workup showed you are consistent bilateral atelectasis/lung scarring.  Please make sure you are following up with your pulmonologist about this.  They do not see any pneumonia however do recommend you just continue taking your antibiotics as prescribed.  Please make sure you are being compliant with your medication and taking your inhalers as prescribed as well as your metoprolol.  Please make sure you are following up with your cardiologist as well.  I have prescribed you some prednisone pills to take for bronchitis.  Please make sure you take these as prescribed.  If you have any concerns, new or worsening symptoms, please return to your nearest emergency department for reevaluation.  Contact a doctor if: Your chest pain does not go away. You feel depressed. You have a fever. Get help right away if: Your chest pain is worse. You have a cough that gets worse, or you cough up blood. You have very bad (severe) pain in your belly (abdomen). You pass out (faint). You have either of these for no clear reason: Sudden chest discomfort. Sudden discomfort in your arms, back, neck, or jaw. You have shortness of breath at any time. You suddenly start to sweat, or your skin gets clammy. You feel sick to your stomach (nauseous). You throw up (vomit). You suddenly feel lightheaded or dizzy. You feel very weak or tired. Your heart starts to beat fast, or it feels like it is skipping beats. These symptoms may be an emergency. Do not wait to see if the symptoms will go away. Get medical help right away. Call your local emergency services (911 in the [place] Do not drive yourself to the hospital.

## 2023-04-21 NOTE — ED Notes (Signed)
AVS with prescriptions provided to and discussed with patient. Pt verbalizes understanding of discharge instructions and denies any questions or concerns at this time. Pt ambulated out of department independently with steady gait.

## 2023-04-21 NOTE — ED Notes (Signed)
Pt transported to Xray via stretcher

## 2023-04-21 NOTE — Progress Notes (Signed)
Patient ambulated with pulse ox. No SOB noted, able to talk the entire walk. SAT 96%, HR 120. PA aware

## 2023-04-21 NOTE — Telephone Encounter (Signed)
She needs to contact her PCP for the handicap.  Thanks.

## 2023-04-21 NOTE — ED Triage Notes (Signed)
Pt presents to ED from home C/O chest pain and SOB X 3 months. Reports multiple admissions to [facility] for pneumonia, not feeling better. Tearful in triage.

## 2023-04-21 NOTE — ED Provider Notes (Signed)
[facility] Provider Note   CSN: [id number] Arrival date & time: 04/21/23  1025     History Chief Complaint  Patient presents with   Chest Pain    Shannon Spears is a 42 y.o. female with history of tachycardia, bronchitis, bipolar, migraines, asthma, chronic pneumonia requiring hospitalization, hypertension presents the emergency room today for evaluation of intermittent chest pain and shortness of breath she has been experiencing over the past 3 months.  She denies any significant acute worsening or change in presentation.  She reports that she has not felt any better since she was last admitted with pneumonia at the beginning of August.  She is already seen her cardiologist and pulmonologist.  She reports that she does not want to go back to her pulmonologist as she feels like she was charged too much money and does not think that sleep apnea is was causing her symptoms.  She reports compliance to her medications however she did not take her metoprolol this morning.  She reports that she was seen recently urgent care was given clarithromycin and a Flovent inhaler to help with her symptoms.  She does not feel like this is helping her any.  She still continues to smoke daily.  She denies any fevers, leg swelling, palpitations, runny nose, vomiting, nausea, vomiting.  She reports that she does have some mild nasal congestion as well as a mild and rare cough.  She denies any IV drug use.  She reports that she drinks between 5-6 caffeinated beverages a day including coffee/caffeinated sodas.  Chest Pain Associated symptoms: no abdominal pain, no cough, no fever, no headache, no nausea, no palpitations, no shortness of breath and no vomiting        Home Medications Prior to Admission medications   Medication Sig Start Date End Date Taking? Authorizing Provider  albuterol (VENTOLIN HFA) 108 (90 Base) MCG/ACT inhaler Inhale 2 puffs into the lungs every 6  (six) hours as needed for wheezing or shortness of breath. 03/07/23   Monna Fam, MD  azelastine (ASTELIN) 0.1 % nasal spray Place 1 spray into both nostrils 2 (two) times daily. 02/26/23   [provider]  budesonide-formoterol (SYMBICORT) 160-4.5 MCG/ACT inhaler Inhale 2 puffs into the lungs daily as needed. 03/07/23   Monna Fam, MD  CALCIUM-VITAMIN D PO Take 1 tablet by mouth daily.    [provider]  cyclobenzaprine (FLEXERIL) 10 MG tablet Take 1 tablet (10 mg total) by mouth 3 (three) times daily as needed for muscle spasms. 06/21/19   Hoy Register, MD  diazepam (VALIUM) 10 MG tablet Take 1 tablet (10 mg total) by mouth 3 (three) times daily as needed for anxiety. 03/24/23   Mozingo, Thereasa Solo, NP  famotidine (PEPCID AC MAXIMUM STRENGTH) 20 MG tablet Take 20 mg by mouth 2 (two) times daily.    [provider]  hydrOXYzine (ATARAX) 25 MG tablet TAKE 1 TABLET BY MOUTH UP TO 6 TIMES PERDAY AS NEEDED FOR ANXIETY, ITCHING, AND INSOMNIA 03/24/23   Mozingo, Thereasa Solo, NP  Lactobacillus (PROBIOTIC ACIDOPHILUS PO) Take 1 capsule by mouth daily.    [provider]  lamoTRIgine (LAMICTAL) 200 MG tablet Take 1 tablet (200 mg total) by mouth at bedtime. 03/24/23   Mozingo, Thereasa Solo, NP  levETIRAcetam (KEPPRA) 500 MG tablet Take 1,000 mg by mouth 2 (two) times daily.    [provider]  levothyroxine (SYNTHROID) 25 MCG tablet Take 25 mcg by mouth daily.  [provider]  methocarbamol (ROBAXIN-750) 750 MG tablet Take 1 tablet (750 mg total) by mouth 2 (two) times daily as needed for muscle spasms. Patient not taking: Reported on 03/07/2023 02/24/23   Cristie Hem, PA-C  metoprolol succinate (TOPROL-XL) 25 MG 24 hr tablet Take 37.5 mg by mouth at bedtime. 01/15/23   [provider]  montelukast (SINGULAIR) 10 MG tablet Take 10 mg by mouth daily.    [provider]  ondansetron (ZOFRAN-ODT) 4 MG disintegrating tablet  Take 1 tablet (4 mg total) by mouth every 8 (eight) hours as needed for nausea or vomiting. 01/03/23   Gailen Shelter, PA  Vitamin D, Ergocalciferol, 50000 units CAPS Take 1 capsule by mouth once a week. Thursday    [provider]      Allergies    Buprenorphine, Penicillins, Gabapentin, Nsaids, Propoxyphene, Tetanus toxoids, Adhesive [tape], Codeine, Ibuprofen, and Doxycycline    Review of Systems   Review of Systems  Constitutional:  Negative for chills and fever.  HENT:  Positive for congestion. Negative for rhinorrhea and sore throat.   Respiratory:  Negative for cough and shortness of breath.   Cardiovascular:  Positive for chest pain. Negative for palpitations and leg swelling.  Gastrointestinal:  Negative for abdominal pain, constipation, diarrhea, nausea and vomiting.  Genitourinary:  Negative for dysuria and hematuria.  Musculoskeletal:  Negative for neck pain.  Neurological:  Negative for headaches.    Physical Exam Updated Vital Signs BP 106/82   Pulse 99   Temp 97.8 F (36.6 C) (Oral)   Resp (!) 21   Ht 5\' 1"  (1.549 m)   Wt 82.6 kg   LMP 10/02/2012   SpO2 99%   BMI 34.39 kg/m  Physical Exam Vitals and nursing note reviewed.  Constitutional:      Appearance: Normal appearance.  Eyes:     General: No scleral icterus. Cardiovascular:     Rate and Rhythm: Regular rhythm. Tachycardia present.     Pulses:          Radial pulses are 2+ on the right side and 2+ on the left side.       Dorsalis pedis pulses are 2+ on the right side and 2+ on the left side.       Posterior tibial pulses are 2+ on the right side and 2+ on the left side.  Pulmonary:     Effort: Pulmonary effort is normal. No respiratory distress.     Breath sounds: Examination of the right-lower field reveals wheezing. Examination of the left-lower field reveals wheezing. Wheezing present.     Comments: Speaking in full sentences with ease satting well room air without increased work of  breathing.  Some coarse wheezing heard in the lower bases, left greater than right. Chest:     Chest wall: No tenderness.  Abdominal:     General: Abdomen is flat. Bowel sounds are normal.     Palpations: Abdomen is soft.     Tenderness: There is no abdominal tenderness.  Musculoskeletal:        General: No deformity.     Cervical back: Normal range of motion.     Right lower leg: No tenderness. No edema.     Left lower leg: No tenderness. No edema.  Skin:    General: Skin is warm and dry.  Neurological:     General: No focal deficit present.     Mental Status: She is alert. Mental status is at baseline.  ED Results / Procedures / Treatments   Labs (all labs ordered are listed, but only abnormal results are displayed) Labs Reviewed  BASIC METABOLIC PANEL - Abnormal; Notable for the following components:      Result Value   Glucose, Bld 106 (*)    Calcium 8.7 (*)    All other components within normal limits  CBC - Abnormal; Notable for the following components:   Platelets 437 (*)    All other components within normal limits  D-DIMER, QUANTITATIVE - Abnormal; Notable for the following components:   D-Dimer, Quant 0.52 (*)    All other components within normal limits  RESP PANEL BY RT-PCR (RSV, FLU A&B, COVID)  RVPGX2  TROPONIN I (HIGH SENSITIVITY)  TROPONIN I (HIGH SENSITIVITY)    EKG EKG Interpretation Date/Time:  Monday April 21 2023 10:34:42 EDT Ventricular Rate:  105 PR Interval:  127 QRS Duration:  86 QT Interval:  341 QTC Calculation: 451 R Axis:   29  Text Interpretation: Sinus tachycardia Low voltage, precordial leads No significant change since last tracing Confirmed by Elayne Snare ([id number]) on 04/21/2023 10:46:41 AM  Radiology CT Angio Chest PE W and/or Wo Contrast  Result Date: 04/21/2023 CLINICAL DATA:  Pulmonary embolism (PE) suspected, low to intermediate prob, positive D-dimer. Chest pain. Shortness of breath. EXAM: CT ANGIOGRAPHY CHEST  WITH CONTRAST TECHNIQUE: Multidetector CT imaging of the chest was performed using the standard protocol during bolus administration of intravenous contrast. Multiplanar CT image reconstructions and MIPs were obtained to evaluate the vascular anatomy. RADIATION DOSE REDUCTION: This exam was performed according to the departmental dose-optimization program which includes automated exposure control, adjustment of the mA and/or kV according to patient size and/or use of iterative reconstruction technique. CONTRAST:  75mL OMNIPAQUE IOHEXOL 350 MG/ML SOLN COMPARISON:  CT angiography chest from 03/06/2023. FINDINGS: Cardiovascular: No evidence of embolism to the proximal subsegmental pulmonary artery level. Normal cardiac size. No pericardial effusion. No aortic aneurysm. Mediastinum/Nodes: Visualized thyroid gland appears grossly unremarkable. No solid / cystic mediastinal masses. The esophagus is nondistended precluding optimal assessment. No axillary, mediastinal or hilar lymphadenopathy by size criteria. Lungs/Pleura: The central tracheo-bronchial tree is patent. There are patchy areas of linear, plate-like atelectasis and/or scarring throughout bilateral lungs, improved since the prior study. No mass or consolidation. No pleural effusion or pneumothorax. No suspicious lung nodules. Upper Abdomen: Visualized upper abdominal viscera within normal limits. Surgically absent gallbladder. Musculoskeletal: The visualized soft tissues of the chest wall are grossly unremarkable. No suspicious osseous lesions. There are mild multilevel degenerative changes in the visualized spine. Partially seen lower cervical spinal fixation hardware. Review of the MIP images confirms the above findings. IMPRESSION: 1. No evidence of pulmonary embolism. No acute intrathoracic pathology identified. 2. Patchy areas of linear, plate-like atelectasis and/or scarring throughout bilateral lungs. Electronically Signed   By: Jules Schick M.D.   On:  04/21/2023 13:46   DG Chest 2 View  Result Date: 04/21/2023 CLINICAL DATA:  Chest pain EXAM: CHEST - 2 VIEW COMPARISON:  X-ray 03/06/2023 FINDINGS: Slight linear opacity left lung base likely scar or atelectasis. No consolidation, pneumothorax or effusion. No edema. Normal cardiopericardial silhouette. Overlapping cardiac leads. Fixation hardware along the lower cervical spine. IMPRESSION: There is some linear opacity left lung base likely scar or atelectasis. Electronically Signed   By: Karen Kays M.D.   On: 04/21/2023 11:55    Procedures Procedures   Medications Ordered in ED Medications  ipratropium-albuterol (DUONEB) 0.5-2.5 (3) MG/3ML nebulizer solution 3 mL (3 mLs  Nebulization Given 04/21/23 1145)  iohexol (OMNIPAQUE) 350 MG/ML injection 75 mL (75 mLs Intravenous Contrast Given 04/21/23 1216)  methylPREDNISolone sodium succinate (SOLU-MEDROL) 125 mg/2 mL injection 125 mg (125 mg Intravenous Given 04/21/23 1306)    ED Course/ Medical Decision Making/ A&P                               Medical Decision Making Amount and/or Complexity of Data Reviewed Labs: ordered. Radiology: ordered.  Risk Prescription drug management.   42 y.o. female presents to the ER for evaluation of chest pain/SOB. Differential diagnosis includes but is not limited to CHF, pericardial effusion/tamponade, arrhythmias, ACS, COPD, asthma, bronchitis, pneumonia, pneumothorax, PE, anemia. Vital signs mild tachycardia, otherwise unremarkable. Physical exam as noted above.   On previous chart evaluation, patient's been seen multiple times in the emergency department over the past few months for chest pain/shortness of breath/palpitations.  Last admission was an overnight admission on 03-06-2023 because she had some mild hypoxia.  She was recently seen by the pulmonologist on 03-12-2023 and seen by her PCP on 04-14-2023.  Patient does have tachycardia but does have history of tachycardia and has not taken her metoprolol  this morning.  Because of this, cannot make her PERC negative, will add on D-dimer.  Duoneb and solumedrol ordered.  I independently reviewed and interpreted the patient's labs.  Prescribed panel negative for COVID, flu, RSV.  BMP shows mildly elevated Leukos 106 mildly decreased calcium 8.7 otherwise no electrolyte abnormality.  CBC shows slight increase in platelets at 437 but no leukocytosis or anemia.  Platelets seem to be at baseline.  Troponin at less than 2.  D-dimer slightly elevated at 0.52.  Because of elevated D-dimer, will need to add on CT angio chest rule out any PE.  EKG reviewed and interpreted by attending and read as Sinus tachycardia Low voltage, precordial leads No significant change.  CXR shows there is some linear opacity left lung base likely scar or atelectasis. Per radiologist's read.   CT imaging shows  1. No evidence of pulmonary embolism. No acute intrathoracic pathology identified. 2. Patchy areas of linear, plate-like atelectasis and/or scarring throughout bilateral lungs. Per radiologist's read.  Patient's been on clarithromycin given to her by urgent care for presumed pneumonia.  She is been ambulatory around the emergency department without loss of oxygenation.  She is satting well on room air without any increased work of breathing.  She has been seen by cardiology and pulmonology for these issues.  She is refused to do a sleep study like pulmonology is asking her to.  She reports has been compliant with her medications however when asking her if she is taking the Wixela inhaler that was documented on her last family med visit, she is not taking this inhaler and instead using the Flovent inhaler she got from urgent care a few days prior.  She did have some coarse wheezing in her left lower lung greater than her right.  She could be having some asthma exacerbation from her noncompliance with her medications.  I will prescribe her short course of prednisone to take to see  if this helps.  I discussed with her at length the importance of remaining compliant with her medications as well as following up with pulmonology and cardiology recommendations.  I asked that she schedule follow-up appointments with both of these specialties.  CT imaging does not show any pneumonia.  Shows scarring which is  in previous center other CTs that she has had.  She does not meet any criteria for sepsis.  These problems have been going on for over the past 3 months intermittently and there is not been any acute worsening.  Additionally, I discussed with her about limiting her caffeine as she drinks 6 caffeinated drinks a day including 2 to 3 cups of coffee and 2-3 caffeinated beverages.  Also recommend cutting back on her tobacco use as this could be contributing to her worsening of her asthma/chest pain/shortness of breath.  After the duoneb she received and the solumedrol, she reports that she is feeling better. Her lung sounds have improved as well.   She is stable for discharge home with close outpatient specialty follow-up.  We discussed the results of the labs/imaging. The plan is follow-up with specialties, take medication as prescribed. We discussed strict return precautions and red flag symptoms. The patient verbalized their understanding and agrees to the plan. The patient is stable and being discharged home in good condition.  Portions of this report may have been transcribed using voice recognition software. Every effort was made to ensure accuracy; however, inadvertent computerized transcription errors may be present.   Final Clinical Impression(s) / ED Diagnoses Final diagnoses:  Nonspecific chest pain  Bronchitis    Rx / DC Orders ED Discharge Orders          Ordered    methylPREDNISolone (MEDROL DOSEPAK) 4 MG TBPK tablet  Daily,   Status:  Discontinued        04/21/23 1543    methylPREDNISolone (MEDROL DOSEPAK) 4 MG TBPK tablet  Daily        04/21/23 1543               Achille Rich, PA-C 04/24/23 1644    [address], [place], [place] 04/25/23 [phone]

## 2023-04-30 ENCOUNTER — Encounter: Payer: Self-pay | Admitting: Internal Medicine

## 2023-04-30 ENCOUNTER — Ambulatory Visit: Payer: Managed Care, Other (non HMO) | Admitting: Internal Medicine

## 2023-04-30 VITALS — BP 118/82 | HR 107 | Ht 61.0 in | Wt 194.0 lb

## 2023-04-30 DIAGNOSIS — J45991 Cough variant asthma: Secondary | ICD-10-CM | POA: Diagnosis not present

## 2023-04-30 MED ORDER — AIRSUPRA 90-80 MCG/ACT IN AERO: 2.0000 | INHALATION_SPRAY | RESPIRATORY_TRACT | 11 refills | Status: AC | PRN

## 2023-04-30 MED ORDER — PANTOPRAZOLE SODIUM 40 MG PO TBEC: 40.0000 mg | DELAYED_RELEASE_TABLET | Freq: Every day | ORAL | 2 refills | Status: DC

## 2023-04-30 NOTE — Progress Notes (Signed)
Shannon Spears, female    DOB: 05/02/1981   MRN: [id number]   Brief patient profile:  70 yof active smoker some passive exp growing up a tendency to "sinus infections" assoc URI with one ent eval in elementary school fall and winter the worst seemed to outgrow in middle school   referred to pulmonary clinic 04/30/2023 by Rayfield Citizen PA  for cough and sob       Tolerated pregnancy fine full term wth new baseline wt 150s  01/2012  and by 2014 > cx neck sugery Yates  Noted wt 2017 doe assoc with wt gain / seemed better if exercised p using saba at most once a day then had bad coughing fit on way to get tubes tied x 2017 no response to saba > d/c on neb     History of Present Illness  04/30/2023  Pulmonary/ 1st office eval/[facility] maint on advair  Chief Complaint  Patient presents with   Establish Care    Hx of pna; allergy testing NEG; [facility] noted no asthma s/s  Dyspnea:  can walk at [employer] not [employer]/ also limited by R hip  Cough: flares with sinus dz and overt HB  Sleep: on couch with dogs  feels bad am  SABA use: rarely and never ex     No obvious day to day or daytime pattern/variability or assoc excess/ purulent sputum or mucus plugs or hemoptysis or cp or chest tightness, subjective wheeze or overt sinus or hb symptoms.    Also denies any obvious fluctuation of symptoms with weather or environmental changes or other aggravating or alleviating factors except as outlined above   No unusual exposure hx or h/o childhood pna/ asthma or knowledge of premature birth.  Current Allergies, Complete Past Medical History, Past Surgical History, Family History, and Social History were reviewed in [employer] record.  ROS  The following are not active complaints unless bolded Hoarseness, sore throat, dysphagia, dental problems, itching, sneezing,  nasal congestion or discharge of excess mucus or purulent secretions, ear ache,   fever, chills, sweats, unintended wt  loss or wt gain, classically pleuritic or exertional cp,  orthopnea pnd or arm/hand swelling  or leg swelling, presyncope, palpitations, abdominal pain, anorexia, nausea, vomiting, diarrhea  or change in bowel habits or change in bladder habits, change in stools or change in urine, dysuria, hematuria,  rash, arthralgias, visual complaints, headache, numbness, weakness or ataxia or problems with walking or coordination,  change in mood or  memory.              Outpatient Medications Prior to Visit  Medication Sig Dispense Refill   albuterol (VENTOLIN HFA) 108 (90 Base) MCG/ACT inhaler Inhale 2 puffs into the lungs every 6 (six) hours as needed for wheezing or shortness of breath. 6.7 g 2   azelastine (ASTELIN) 0.1 % nasal spray Place 1 spray into both nostrils 2 (two) times daily.     CALCIUM-VITAMIN D PO Take 1 tablet by mouth daily.     cyclobenzaprine (FLEXERIL) 10 MG tablet Take 1 tablet (10 mg total) by mouth 3 (three) times daily as needed for muscle spasms. 60 tablet 1   diazepam (VALIUM) 10 MG tablet Take 1 tablet (10 mg total) by mouth 3 (three) times daily as needed for anxiety. 90 tablet 2   famotidine (PEPCID AC MAXIMUM STRENGTH) 20 MG tablet Take 20 mg by mouth 2 (two) times daily.     fluticasone-salmeterol (ADVAIR) 100-50 MCG/ACT  AEPB Inhale into the lungs.     hydrOXYzine (ATARAX) 25 MG tablet TAKE 1 TABLET BY MOUTH UP TO 6 TIMES PERDAY AS NEEDED FOR ANXIETY, ITCHING, AND INSOMNIA 180 tablet 2   ipratropium (ATROVENT) 0.06 % nasal spray Place into the nose.     Lactobacillus (PROBIOTIC ACIDOPHILUS PO) Take 1 capsule by mouth daily.     lamoTRIgine (LAMICTAL) 200 MG tablet Take 1 tablet (200 mg total) by mouth at bedtime. 30 tablet 5   levETIRAcetam (KEPPRA) 500 MG tablet Take 1,000 mg by mouth 2 (two) times daily.     levothyroxine (SYNTHROID) 25 MCG tablet Take 25 mcg by mouth daily.     metoprolol succinate (TOPROL-XL) 25 MG 24 hr tablet Take 37.5 mg by mouth at bedtime.      montelukast (SINGULAIR) 10 MG tablet Take 10 mg by mouth daily.     nystatin (MYCOSTATIN) 100000 UNIT/ML suspension Take by mouth.     ondansetron (ZOFRAN-ODT) 4 MG disintegrating tablet Take 1 tablet (4 mg total) by mouth every 8 (eight) hours as needed for nausea or vomiting. 20 tablet 0   Vitamin D, Ergocalciferol, 50000 units CAPS Take 1 capsule by mouth once a week. Thursday     methocarbamol (ROBAXIN-750) 750 MG tablet Take 1 tablet (750 mg total) by mouth 2 (two) times daily as needed for muscle spasms. (Patient not taking: Reported on 03/07/2023) 20 tablet 2   budesonide-formoterol (SYMBICORT) 160-4.5 MCG/ACT inhaler Inhale 2 puffs into the lungs daily as needed. (Patient not taking: Reported on 04/30/2023) 10.2 g 12   No facility-administered medications prior to visit.    Past Medical History:  Diagnosis Date   Anxiety    Arthritis    Bipolar 1 disorder, manic, mild (HCC)    CKD (chronic kidney disease)    Depression    Fibromyalgia    GERD (gastroesophageal reflux disease)    Headache(784.0)    Palpitations       Objective:     BP 118/82   Pulse (!) 107   Ht 5\' 1"  (1.549 m)   Wt 194 lb (88 kg)   LMP 10/02/2012   SpO2 92%   BMI 36.66 kg/m   SpO2: 92 % RA  Amb mod obese wf nad    HEENT : Oropharynx  clear      Nasal turbinates nl    NECK :  without  apparent JVD/ palpable Nodes/TM    LUNGS: no acc muscle use,  Nl contour chest which is clear to A and P bilaterally without cough on insp or exp maneuvers   CV:  RRR  no s3 or murmur or increase in P2, and no edema   ABD:  soft and nontender with nl inspiratory excursion in the supine position. No bruits or organomegaly appreciated   MS:  Nl gait/ ext warm without deformities Or obvious joint restrictions  calf tenderness, cyanosis or clubbing    SKIN: warm and dry without lesions    NEURO:  alert, approp, nl sensorium with  no motor or cerebellar deficits apparent.     I personally reviewed images and  agree with radiology impression as follows:   Chest CTw/o contrast    03/31/23   Persistent scattered areas of platelike atelectasis within the lung bases        Assessment   Cough variant asthma vs UACS with VCD Assoc with sinus dz dating back to childhood / active smoking as adult - PFTs 03/12/23 no obstruction - CT  w/o contrast 03/31/23 plate like atx bases - Sinus CT  04/30/2023 >>>  - 04/30/2023  After extensive coaching inhaler device,  effectiveness =    50% (short ti) > changed wixella to air supra trial and max gerd rx   DDX of  difficult airways management almost all start with A and  include Adherence, Ace Inhibitors, Acid Reflux, Active Sinus Disease, Alpha 1 Antitripsin deficiency, Anxiety masquerading as Airways dz,  ABPA,  Allergy(esp in young), Aspiration (esp in elderly), Adverse effects of meds,  Active smoking or vaping, A bunch of PE's (a small clot burden can't cause this syndrome unless there is already severe underlying pulm or vascular dz with poor reserve) plus two Bs  = Bronchiectasis and Beta blocker use..and one C= CHF   Most likely dx's bolded   Adherence is always the initial "prime suspect" and is a multilayered concern that requires a "trust but verify" approach in every patient - starting with knowing how to use medications, especially inhalers, correctly, keeping up with refills and understanding the fundamental difference between maintenance and prns vs those medications only taken for a very short course and then stopped and not refilled.  - see hfa teaching - return with all meds in hand using a trust but verify approach to confirm accurate Medication  Reconciliation The principal here is that until we are certain that the  patients are doing what we've asked, it makes no sense to ask them to do more.   Active smoking top of the list > discussed separately   ? Acid (or non-acid) GERD > always difficult to exclude as up to 75% of pts in some series report no  assoc GI/ Heartburn symptoms> rec max (24h)  acid suppression and diet restrictions/ reviewed and instructions given in writing.   ? Allergy /asthma > Airsupra should cover this adequately, get FENO on return if not improving; Re SABA :  I spent extra time with pt today reviewing appropriate use of albuterol for prn use on exertion with the following points: 1) saba is for relief of sob that does not improve by walking a slower pace or resting but rather if the pt does not improve after trying this first. 2) If the pt is convinced, as many are, that saba helps recover from activity faster then it's easy to tell if this is the case by re-challenging : ie stop, take the inhaler, then p 5 minutes try the exact same activity (intensity of workload) that just caused the symptoms and see if they are substantially diminished or not after saba 3) if there is an activity that reproducibly causes the symptoms, try the saba 15 min before the activity on alternate days   If in fact the saba really does help, then fine to continue to use it prn but advised may need to look closer at the maintenance regimen being used to achieve better control of airways disease with exertion.   ? Adverse drug effect >  wixella (dpi) may aggravate cough more than saba/low dose ICS > changed to air supra  F/u in 4-6 weeks   Each maintenance medication was reviewed in detail including emphasizing most importantly the difference between maintenance and prns and under what circumstances the prns are to be triggered using an action plan format where appropriate.  Total time for H and P, chart review, counseling, reviewing hfa/neb device(s) , directly observing portions of ambulatory 02 saturation study/ and generating customized AVS unique to this office visit /  same day charting = 60 min                     Sandrea Hughs, MD 04/30/2023

## 2023-04-30 NOTE — Patient Instructions (Addendum)
My office will be contacting you by phone for referral for sinus CT  - if you don't hear back from my office within one week please call us back or notify us thru MyChart and we'll address it right away.   Stop wixella   Only use your Air Supra  as a rescue medication to be used if you can't catch your breath by resting or doing a relaxed purse lip breathing pattern.  - The less you use it, the better it will work when you need it. - Ok to use up to 2 puffs  every 4 hours if you must but call for immediate appointment if use goes up over your usual need - Don't leave home without it !!  (think of it like starter fluid)    Also  Ok to try Sirsupra  15 min before an activity (on alternating days)  that you know would usually make you short of breath and see if it makes any difference and if makes none then don't take albuterol after activity unless you can't catch your breath as this means it's the resting that helps, not the albuterol.  Work on inhaler technique:  relax and gently blow all the way out then take a nice smooth full deep breath back in, triggering the inhaler at same time you start breathing in.  Hold breath in for at least  5 seconds if you can. Blow out Air supra  thru nose. Rinse and gargle with water when done.  If mouth or throat bother you at all,  try brushing teeth/gums/tongue with arm and hammer toothpaste/ make a slurry and gargle and spit out.       Pantoprazole (protonix) 40 mg   Take  30-60 min before first meal of the day and Pepcid (famotidine)  20 mg after supper until return to office - this is the best way to tell whether stomach acid is contributing to your problem.    GERD (REFLUX)  is an extremely common cause of respiratory symptoms just like yours , many times with no obvious heartburn at all.    It can be treated with medication, but also with lifestyle changes including elevation of the head of your bed (ideally with 6 -8inch blocks under the headboard of  your bed),  Smoking cessation, avoidance of late meals, excessive alcohol, and avoid fatty foods, chocolate, peppermint, colas, red wine, and acidic juices such as orange juice.  NO MINT OR MENTHOL PRODUCTS SO NO COUGH DROPS  USE SUGARLESS CANDY INSTEAD (Jolley ranchers or Stover's or Life Savers) or even ice chips will also do - the key is to swallow to prevent all throat clearing. NO OIL BASED VITAMINS - use powdered substitutes.  Avoid fish oil when coughing.    Please schedule a follow up office visit in 6 weeks [place] office , call sooner if needed with all medications /inhalers/ solutions in hand so we can verify exactly what you are taking. This includes all medications from all doctors and over the counters - PLEASE separate them into two bags:  the ones you take automatically, no matter what, vs the ones you take just when you feel you need them "BAG #2 is UP TO YOU"  - this will really help [place] help you take your medications more effectively.

## 2023-04-30 NOTE — Assessment & Plan Note (Addendum)
Assoc with sinus dz dating back to childhood / active smoking as adult - PFTs 03/12/23 no obstruction - CT w/o contrast 03/31/23 plate like atx bases - Sinus CT  04/30/2023 >>>  - 04/30/2023  After extensive coaching inhaler device,  effectiveness =    50% (short ti) > changed wixella to air supra trial and max gerd rx   DDX of  difficult airways management almost all start with A and  include Adherence, Ace Inhibitors, Acid Reflux, Active Sinus Disease, Alpha 1 Antitripsin deficiency, Anxiety masquerading as Airways dz,  ABPA,  Allergy(esp in young), Aspiration (esp in elderly), Adverse effects of meds,  Active smoking or vaping, A bunch of PE's (a small clot burden can't cause this syndrome unless there is already severe underlying pulm or vascular dz with poor reserve) plus two Bs  = Bronchiectasis and Beta blocker use..and one C= CHF   Most likely dx's bolded   Adherence is always the initial "prime suspect" and is a multilayered concern that requires a "trust but verify" approach in every patient - starting with knowing how to use medications, especially inhalers, correctly, keeping up with refills and understanding the fundamental difference between maintenance and prns vs those medications only taken for a very short course and then stopped and not refilled.  - see hfa teaching - return with all meds in hand using a trust but verify approach to confirm accurate Medication  Reconciliation The principal here is that until we are certain that the  patients are doing what we've asked, it makes no sense to ask them to do more.   Active smoking top of the list > discussed separately   ? Acid (or non-acid) GERD > always difficult to exclude as up to 75% of pts in some series report no assoc GI/ Heartburn symptoms> rec max (24h)  acid suppression and diet restrictions/ reviewed and instructions given in writing.   ? Allergy /asthma > Airsupra should cover this adequately, get FENO on return if not  improving; Re SABA :  I spent extra time with pt today reviewing appropriate use of albuterol for prn use on exertion with the following points: 1) saba is for relief of sob that does not improve by walking a slower pace or resting but rather if the pt does not improve after trying this first. 2) If the pt is convinced, as many are, that saba helps recover from activity faster then it's easy to tell if this is the case by re-challenging : ie stop, take the inhaler, then p 5 minutes try the exact same activity (intensity of workload) that just caused the symptoms and see if they are substantially diminished or not after saba 3) if there is an activity that reproducibly causes the symptoms, try the saba 15 min before the activity on alternate days   If in fact the saba really does help, then fine to continue to use it prn but advised may need to look closer at the maintenance regimen being used to achieve better control of airways disease with exertion.   ? Adverse drug effect >  wixella (dpi) may aggravate cough more than saba/low dose ICS > changed to air supra  F/u in 4-6 weeks   Each maintenance medication was reviewed in detail including emphasizing most importantly the difference between maintenance and prns and under what circumstances the prns are to be triggered using an action plan format where appropriate.  Total time for H and P, chart review, counseling, reviewing  hfa/neb device(s) , directly observing portions of ambulatory 02 saturation study/ and generating customized AVS unique to this office visit / same day charting = 60 min

## 2023-05-02 ENCOUNTER — Other Ambulatory Visit (HOSPITAL_COMMUNITY): Payer: Self-pay

## 2023-05-02 ENCOUNTER — Encounter: Payer: Self-pay | Admitting: Orthopaedic Surgery

## 2023-05-02 ENCOUNTER — Telehealth: Payer: Self-pay

## 2023-05-02 NOTE — Telephone Encounter (Signed)
Pharmacy Patient Advocate Encounter   Received notification from CoverMyMeds that prior authorization for Airsupra 90-80MCG/ACT aerosol is required/requested.   Insurance verification completed.   The patient is insured through [employer] .   Per test claim: PA required; PA submitted to [employer] via CoverMyMeds Key/confirmation #/EOC [id number] Status is pending

## 2023-05-02 NOTE — Telephone Encounter (Signed)
Her fracture has healed and she has been released from [place] for that.  I would have her reach out to her pain/spine specialist

## 2023-05-08 ENCOUNTER — Encounter: Payer: Self-pay | Admitting: Internal Medicine

## 2023-05-08 ENCOUNTER — Encounter: Payer: Self-pay | Admitting: Orthopedic Surgery

## 2023-05-09 ENCOUNTER — Ambulatory Visit: Payer: Managed Care, Other (non HMO) | Admitting: Family

## 2023-05-09 DIAGNOSIS — M25471 Effusion, right ankle: Secondary | ICD-10-CM

## 2023-05-09 NOTE — Telephone Encounter (Signed)
Dr. Wert, please see mychart messages sent by pt and advise. 

## 2023-05-14 NOTE — Telephone Encounter (Signed)
Pharmacy Patient Advocate Encounter  Received notification from [employer] that Prior Authorization for Airsupra 90-80MCG/ACT aerosol has been APPROVED from 05-02-2023 to 04-30-2024   PA #/Case ID/Reference #: [id number]

## 2023-05-21 ENCOUNTER — Encounter: Payer: Self-pay | Admitting: Family

## 2023-05-21 NOTE — Progress Notes (Signed)
Office Visit Note   Patient: Shannon Spears           Date of Birth: 03-28-1981           MRN: [id number] Visit Date: 05/09/2023              Requested by: Susann Givens, PA-C [address] [place],  [place] [address] PCP: Susann Givens, PA-C  Chief Complaint  Patient presents with   Right Foot - Pain      HPI: The patient is a 42 year old woman who presents today for evaluation of right foot and ankle pain and swelling.  She did have a sprain of the same ankle in March of this year.  She has had difficulty with swelling since that time  She has previously been seen in our office for this injury.  She reports she has not yet able to schedule ligament reconstruction with arthroscopy and debridement of the ankle due to her responsibilities at home.  Today she is in slide on shoewear complaining of excruciating pain with prolonged walking  Assessment & Plan: Visit Diagnoses: No diagnosis found.  Plan: Discussed the importance of compression garments to the right lower extremity elevation supportive shoe wear  Follow-Up Instructions: No follow-ups on file.   Right Ankle Exam  Swelling: moderate  Range of Motion  The patient has normal right ankle ROM.  Muscle Strength  The patient has normal right ankle strength.  Other  Erythema: absent Pulse: present   Comments:  Anterior joint line tenderness      Patient is alert, oriented, no adenopathy, well-dressed, normal affect, normal respiratory effort.   Imaging: No results found. No images are attached to the encounter.  Labs: Lab Results  Component Value Date   HGBA1C 5.4 02/01/2019   ESRSEDRATE 30 (H) 02/26/2017   LABURIC 4.6 02/26/2017   REPTSTATUS 03/11/2023 FINAL 03/06/2023   CULT  03/06/2023    NO GROWTH 5 DAYS Performed at [facility], [address], [place], [place] [address]      Lab Results  Component Value Date   ALBUMIN 2.9 (L) 03/07/2023   ALBUMIN 3.3 (L)  03/06/2023   ALBUMIN 2.5 (L) 01/19/2023    Lab Results  Component Value Date   MG 1.7 01/19/2023   MG 1.9 01/18/2023   No results found for: "VD25OH"  No results found for: "PREALBUMIN"    Latest Ref Rng & Units 04/21/2023   10:35 AM 03/07/2023    2:56 AM 03/06/2023    5:01 PM  CBC EXTENDED  WBC 4.0 - 10.5 K/uL 10.3  10.9    RBC 3.87 - 5.11 MIL/uL 4.62  4.37    Hemoglobin 12.0 - 15.0 g/dL 33.2  95.1  88.4   HCT 36.0 - 46.0 % 41.0  38.4  43.0   Platelets 150 - 400 K/uL 437  460    NEUT# 1.7 - 7.7 K/uL  9.6    Lymph# 0.7 - 4.0 K/uL  1.2       There is no height or weight on file to calculate BMI.  Orders:  No orders of the defined types were placed in this encounter.  No orders of the defined types were placed in this encounter.    Procedures: No procedures performed  Clinical Data: No additional findings.  ROS:  All other systems negative, except as noted in the HPI. Review of Systems  Constitutional: Negative.   Musculoskeletal:  Positive for joint swelling and  myalgias.  Neurological:  Negative for weakness and numbness.    Objective: Vital Signs: LMP 10/02/2012   Specialty Comments:  No specialty comments available.  PMFS History: Patient Active Problem List   Diagnosis Date Noted   Cough variant asthma vs UACS with VCD 04/30/2023   Acute hypoxic respiratory failure (HCC) 03/06/2023   CAP (community acquired pneumonia) 01/18/2023   Sepsis (HCC) 01/18/2023   Atypical chest pain 01/18/2023   Sinus tachycardia 01/18/2023   GAD (generalized anxiety disorder) 01/18/2023   GERD (gastroesophageal reflux disease) 01/18/2023   Mild intermittent asthma 01/18/2023   Essential hypertension 01/18/2023   Fracture, stress, femur, neck, initial encounter 11/22/2022   Vitamin D deficiency 11/28/2020   Osteopenia 11/08/2020   Knee contusion 04/06/2019   Bipolar 1 disorder (HCC) 06/16/2018   Chronic post-traumatic stress disorder 06/16/2018   Somatic symptom  disorder 06/16/2018   Opioid use disorder, mild, in sustained remission (HCC) 06/16/2018   S/P cholecystectomy 02/10/2018   Chronic recurrent pancreatitis (HCC) 01/06/2018   S/P cervical spinal fusion 12/08/2017   Protrusion of cervical intervertebral disc 12/08/2017   Anxiety 07/10/2017   Chronic vulvitis 07/10/2017   Irritable bowel syndrome 07/10/2017   Methicillin resistant Staphylococcus aureus infection 07/10/2017   Migraine 07/10/2017   Constipation 06/16/2017   Overweight (BMI 25.0-29.9) 11/11/2016   Acute respiratory insufficiency 06/21/2016   Asthmatic bronchitis with acute exacerbation 06/21/2016   Tobacco abuse disorder 06/21/2016   Fibromyalgia 10/09/2015   Hypercholesterolemia 10/09/2015   Thrombocytosis 09/13/2015   HNP (herniated nucleus pulposus), cervical 10/02/2012    Class: Diagnosis of   Past Medical History:  Diagnosis Date   Anxiety    Arthritis    Bipolar 1 disorder, manic, mild (HCC)    CKD (chronic kidney disease)    Depression    Fibromyalgia    GERD (gastroesophageal reflux disease)    Headache(784.0)    Palpitations     Family History  Problem Relation Age of Onset   Bipolar disorder Mother    Diabetes Mother    Hypertension Mother    Hypertension Father    Colon cancer Paternal Grandmother     Past Surgical History:  Procedure Laterality Date   ANTERIOR CERVICAL DECOMP/DISCECTOMY FUSION N/A 10/02/2012   Procedure: C4-5, C5-6 anterior cervical discectomy and fusion, allograft plate;  Surgeon: Eldred Manges, MD;  Location: [facility] OR;  Service: Orthopedics;  Laterality: N/A;   CHOLECYSTECTOMY     CYSTOGRAPHY     HIP PINNING,CANNULATED Right 11/22/2022   Procedure: RIGHT HIP PINNING;  Surgeon: Tarry Kos, MD;  Location: [facility] OR;  Service: Orthopedics;  Laterality: Right;   LAPAROSCOPIC TOTAL HYSTERECTOMY     OOPHORECTOMY     TUBAL LIGATION     Social History   Occupational History   Not on file  Tobacco Use   Smoking status: Some Days     Current packs/day: 0.50    Types: Cigarettes   Smokeless tobacco: Never  Vaping Use   Vaping status: Never Used  Substance and Sexual Activity   Alcohol use: Yes    Comment: rarely   Drug use: Yes    Types: Marijuana    Comment: occasionally   Sexual activity: Not on file

## 2023-05-22 ENCOUNTER — Ambulatory Visit (HOSPITAL_BASED_OUTPATIENT_CLINIC_OR_DEPARTMENT_OTHER): Payer: Commercial Managed Care - HMO | Admitting: Student

## 2023-05-22 ENCOUNTER — Encounter (HOSPITAL_BASED_OUTPATIENT_CLINIC_OR_DEPARTMENT_OTHER): Payer: Self-pay | Admitting: Student

## 2023-05-22 ENCOUNTER — Ambulatory Visit (HOSPITAL_BASED_OUTPATIENT_CLINIC_OR_DEPARTMENT_OTHER): Payer: Commercial Managed Care - HMO

## 2023-05-22 DIAGNOSIS — M25551 Pain in right hip: Secondary | ICD-10-CM

## 2023-05-22 DIAGNOSIS — M25561 Pain in right knee: Secondary | ICD-10-CM

## 2023-05-22 MED ORDER — TRAMADOL HCL 50 MG PO TABS
50.0000 mg | ORAL_TABLET | Freq: Four times a day (QID) | ORAL | 0 refills | Status: AC | PRN
Start: 2023-05-22 — End: 2023-05-27

## 2023-05-23 NOTE — Progress Notes (Signed)
Chief Complaint: Right hip and right knee pain     History of Present Illness:    Shannon Spears is a 42 y.o. female presenting today with pain in her right hip and right knee.  She is a patient of Dr. Roda Shutters, who performed pinning of the right hip in April due to a stress fracture.  Patient states that yesterday she had a fall onto her right side after she missed a step outside of her home.  She has pain in the left hip mainly over the lateral side, however there is some groin pain.  Pain is located all throughout the right knee.  She does also have some pain in the right side low back however she does see a spine specialist for this.  Has had trouble and pain laying on her right side.  Typically takes Flexeril on a daily basis but has tried baclofen since the fall which has not helped.  Surgical History:   Right hip pinning-11/22/2022  PMH/PSH/Family History/Social History/Meds/Allergies:    Past Medical History:  Diagnosis Date   Anxiety    Arthritis    Bipolar 1 disorder, manic, mild (HCC)    CKD (chronic kidney disease)    Depression    Fibromyalgia    GERD (gastroesophageal reflux disease)    Headache(784.0)    Palpitations    Past Surgical History:  Procedure Laterality Date   ANTERIOR CERVICAL DECOMP/DISCECTOMY FUSION N/A 10/02/2012   Procedure: C4-5, C5-6 anterior cervical discectomy and fusion, allograft plate;  Surgeon: Eldred Manges, MD;  Location: [facility] OR;  Service: Orthopedics;  Laterality: N/A;   CHOLECYSTECTOMY     CYSTOGRAPHY     HIP PINNING,CANNULATED Right 11/22/2022   Procedure: RIGHT HIP PINNING;  Surgeon: Tarry Kos, MD;  Location: [facility] OR;  Service: Orthopedics;  Laterality: Right;   LAPAROSCOPIC TOTAL HYSTERECTOMY     OOPHORECTOMY     TUBAL LIGATION     Social History   Socioeconomic History   Marital status: Married    Spouse name: Not on file   Number of children: Not on file   Years of education: Not on file   Highest  education level: Not on file  Occupational History   Not on file  Tobacco Use   Smoking status: Some Days    Current packs/day: 0.50    Types: Cigarettes   Smokeless tobacco: Never  Vaping Use   Vaping status: Never Used  Substance and Sexual Activity   Alcohol use: Yes    Comment: rarely   Drug use: Yes    Types: Marijuana    Comment: occasionally   Sexual activity: Not on file  Other Topics Concern   Not on file  Social History Narrative   Not on file   Social Determinants of Health   Financial Resource Strain: Low Risk  (01/15/2023)   Received from [facility], [facility]   Overall Financial Resource Strain (CARDIA)    Difficulty of Paying Living Expenses: Not hard at all  Food Insecurity: No Food Insecurity (01/18/2023)   Hunger Vital Sign    Worried About Running Out of Food in the Last Year: Never true    Ran Out of Food in the Last Year: Never true  Transportation Needs: No Transportation Needs (01/18/2023)   PRAPARE - Transportation  Lack of Transportation (Medical): No    Lack of Transportation (Non-Medical): No  Physical Activity: Sufficiently Active (09/24/2021)   Received from [facility], [facility]   Exercise Vital Sign    Days of Exercise per Week: 7 days    Minutes of Exercise per Session: 40 min  Stress: Stress Concern Present (09/24/2021)   Received from [facility], [facility] - Occupational Stress Questionnaire    Feeling of Stress : Very much  Social Connections: Unknown (12/02/2021)   Received from [facility], [facility]   Social Network    Social Network: Not on file   Family History  Problem Relation Age of Onset   Bipolar disorder Mother    Diabetes Mother    Hypertension Mother    Hypertension Father    Colon cancer Paternal Grandmother    Allergies  Allergen Reactions   Buprenorphine Shortness Of Breath and Rash    Breathing problems   Penicillins Anaphylaxis, Nausea  And Vomiting and Swelling    TOLERATES ANCEF   Gabapentin Nausea And Vomiting   Nsaids     Renal insufficiency   Propoxyphene Nausea Only    darvocet   Tetanus Toxoids     Blister on legs, MRSA   Adhesive [Tape] Dermatitis   Codeine     Mouth went numb   Ibuprofen     Has kidney disease.   Doxycycline Rash   Current Outpatient Medications  Medication Sig Dispense Refill   traMADol (ULTRAM) 50 MG tablet Take 1 tablet (50 mg total) by mouth every 6 (six) hours as needed for up to 5 days. 20 tablet 0   albuterol (VENTOLIN HFA) 108 (90 Base) MCG/ACT inhaler Inhale 2 puffs into the lungs every 6 (six) hours as needed for wheezing or shortness of breath. 6.7 g 2   Albuterol-Budesonide (AIRSUPRA) 90-80 MCG/ACT AERO Inhale 2 puffs into the lungs every 4 (four) hours as needed. 10.7 g 11   azelastine (ASTELIN) 0.1 % nasal spray Place 1 spray into both nostrils 2 (two) times daily.     CALCIUM-VITAMIN D PO Take 1 tablet by mouth daily.     cyclobenzaprine (FLEXERIL) 10 MG tablet Take 1 tablet (10 mg total) by mouth 3 (three) times daily as needed for muscle spasms. 60 tablet 1   diazepam (VALIUM) 10 MG tablet Take 1 tablet (10 mg total) by mouth 3 (three) times daily as needed for anxiety. 90 tablet 2   famotidine (PEPCID AC MAXIMUM STRENGTH) 20 MG tablet Take 20 mg by mouth 2 (two) times daily.     fluticasone-salmeterol (ADVAIR) 100-50 MCG/ACT AEPB Inhale into the lungs.     hydrOXYzine (ATARAX) 25 MG tablet TAKE 1 TABLET BY MOUTH UP TO 6 TIMES PERDAY AS NEEDED FOR ANXIETY, ITCHING, AND INSOMNIA 180 tablet 2   ipratropium (ATROVENT) 0.06 % nasal spray Place into the nose.     Lactobacillus (PROBIOTIC ACIDOPHILUS PO) Take 1 capsule by mouth daily.     lamoTRIgine (LAMICTAL) 200 MG tablet Take 1 tablet (200 mg total) by mouth at bedtime. 30 tablet 5   levETIRAcetam (KEPPRA) 500 MG tablet Take 1,000 mg by mouth 2 (two) times daily.     levothyroxine (SYNTHROID) 25 MCG tablet Take 25 mcg by mouth  daily.     methocarbamol (ROBAXIN-750) 750 MG tablet Take 1 tablet (750 mg total) by mouth 2 (two) times daily as needed for muscle spasms. (Patient not taking: Reported on 03/07/2023) 20  tablet 2   metoprolol succinate (TOPROL-XL) 25 MG 24 hr tablet Take 37.5 mg by mouth at bedtime.     montelukast (SINGULAIR) 10 MG tablet Take 10 mg by mouth daily.     nystatin (MYCOSTATIN) 100000 UNIT/ML suspension Take by mouth.     ondansetron (ZOFRAN-ODT) 4 MG disintegrating tablet Take 1 tablet (4 mg total) by mouth every 8 (eight) hours as needed for nausea or vomiting. 20 tablet 0   pantoprazole (PROTONIX) 40 MG tablet Take 1 tablet (40 mg total) by mouth daily. Take 30-60 min before first meal of the day 30 tablet 2   Vitamin D, Ergocalciferol, 50000 units CAPS Take 1 capsule by mouth once a week. Thursday     No current facility-administered medications for this visit.   No results found.  Review of Systems:   A ROS was performed including pertinent positives and negatives as documented in the HPI.  Physical Exam :   Constitutional: NAD and appears stated age Neurological: Alert and oriented Psych: Appropriate affect and cooperative Last menstrual period 10/02/2012.   Comprehensive Musculoskeletal Exam:    Diffuse tenderness to palpation over the lateral hip, specifically of the greater trochanter.  Passive hip ROM bilaterally to 120 degrees flexion, 30 degrees ER, and 20 degrees IR.  Right knee diffusely tender.  Active knee ROM from 10 to 90 degrees.  No significant effusion or ecchymosis.  Distal neurosensory exam intact.  Imaging:   Xray (right hip 2 views, AP pelvis): No evidence of acute fracture or dislocation.  3 surgical pins are visualized in the femoral head and neck and are well-positioned without any evidence of damage or loosening.  Xray (right knee 4 views): Negative for acute bony abnormality.  I personally reviewed and interpreted the radiographs.   Assessment:   42 y.o.  female 6 months status post right hip pinning with right hip and knee pain after a fall yesterday.  X-rays of both areas are negative for fracture and right hip hardware is well-appearing and in good position.  Given this I have encouraged her to continue with the muscle relaxers.  She does have some Robaxin she has not taken recently, so I have recommended to try this as opposed to the Flexeril she takes routinely.  Will also plan to send her a short course of tramadol for moderate to severe pain given that she is unable to take NSAIDs or steroids.  She can plan to return as needed and recommend follow-up with me or Dr. Warren Danes office should symptoms not improve or worsen.  Plan :    -Begin tramadol 50 mg as needed -Return to clinic or follow-up with Dr. Roda Shutters as needed     I personally saw and evaluated the patient, and participated in the management and treatment plan.  Hazle Nordmann, PA-C Orthopedics

## 2023-05-28 ENCOUNTER — Encounter: Payer: Self-pay | Admitting: Physical Medicine and Rehabilitation

## 2023-05-28 ENCOUNTER — Ambulatory Visit: Payer: Commercial Managed Care - HMO | Admitting: Physical Medicine and Rehabilitation

## 2023-05-28 VITALS — BP 112/76 | HR 94

## 2023-05-28 DIAGNOSIS — G894 Chronic pain syndrome: Secondary | ICD-10-CM

## 2023-05-28 DIAGNOSIS — M25551 Pain in right hip: Secondary | ICD-10-CM | POA: Diagnosis not present

## 2023-05-28 DIAGNOSIS — M545 Low back pain, unspecified: Secondary | ICD-10-CM

## 2023-05-28 DIAGNOSIS — M797 Fibromyalgia: Secondary | ICD-10-CM

## 2023-05-28 DIAGNOSIS — G8929 Other chronic pain: Secondary | ICD-10-CM

## 2023-05-28 NOTE — Progress Notes (Signed)
Functional Pain Scale - descriptive words and definitions  Intense (8)    Cannot complete any ADLs without much assistance/cannot concentrate/conversation is difficult/unable to sleep and unable to use distraction. Severe range order  Average Pain 8  Lower back pain and right hip pain. Had R hip surgery in April. Pt has chronic pain.

## 2023-05-28 NOTE — Progress Notes (Signed)
Shannon Spears - 42 y.o. female MRN [id number]  Date of birth: 1981/01/22  Office Visit Note: Visit Date: 05/28/2023 PCP: Susann Givens, PA-C Referred by: Susann Givens, PA-C  Subjective: Chief Complaint  Patient presents with   Lower Back - Pain   Right Hip - Pain   HPI: Shannon Spears is a 42 y.o. female who comes in today for evaluation of acute on chronic bilateral lower back pain radiating to right hip/groin. She is managed by Dr. Glee Arvin from orthopedic standpoint. She was previous patient of Dr. Annell Greening. Pain ongoing for several years, worsened after mechanical fall on 05/22/2023. Her pain worsens with movement and activity. She describes pain as sore and aching sensation, currently rates as 8 out of 10. She has tried home exercise regimen, formal physical therapy and medications with minimal relief of pain. Minimal relief of pain with Tylenol and Flexeril. She is being treated by Acquanetta Sit, PA with [facility] in [facility], she has not followed up with him recently due to insurance issues. Lumbar MRI imaging from 2023 tiny central disc protrusion at L5-S1 without stenosis or impingement. No high grade spinal canal stenosis. History of cervical epidural steroid injections many years ago with minimal relief of pain. History of C4-C5 and C5-C6 ACDF with Dr. Ophelia Charter in 2014. Patient states "steroid injections do not work for me." She was recently evaluated by my partner Hazle Nordmann, [place] for right hip/right knee pain from fall, right hip hardware intact. History of right hip pinning in April of this year. Patient denies focal weakness, numbness and tingling.   Patient does carry diagnosis of chronic pain syndrome, fibromyalgia, anxiety, bipolar disorder and tobacco abuse. She has 10 listed allergies/intolerances to medications. Patient is tearful during our visit today, states she is very depressed and stressed due to home life.     Oswestry Disability Index Score 76% 30  to 40 (80%): very severe disability: Back pain impinges on all aspects of the patient's life. Positive intervention is required.  Review of Systems  Musculoskeletal:  Positive for back pain, joint pain and myalgias.  Neurological:  Negative for tingling, sensory change, focal weakness and weakness.  All other systems reviewed and are negative.  Otherwise per HPI.  Assessment & Plan: Visit Diagnoses:    ICD-10-CM   1. Chronic bilateral low back pain without sciatica  M54.50    G89.29     2. Pain in right hip  M25.551     3. Chronic pain syndrome  G89.4     4. Fibromyalgia  M79.7        Plan: Findings:  Acute on chronic bilateral lower back pain radiating to right hip/groin in the setting of chronic pain and fibromyalgia. Patient continues to have severe pain despite good conservative therapies such as formal physical therapy, home exercise regimen, rest and use of medications. Patients clinical presentation and exam are complex, lumbar MRI imaging from 2023 does not directly correlate with her symptoms, there is no frank nerve impingement, no high grade spinal canal stenosis. I do feel her chronic pain/fibromyalgia is working to exacerbate her symptoms. Tenderness noted to cervical, thoracic and lumbar paraspinal regions upon exam today, she is extremely tender to touch and grimaced frequently during exam today. From a spine standpoint, we would not recommend interventional procedures. I do feel she would benefit from re-grouping with physical therapy, however she is not interested in continuing with PT at this time. I feel her best option is chronic pain  management, I recommend she speak with her PCP regarding referral. If she needs to stay within [facility] due to insurance can try [facility]. She can also follow up with Acquanetta Sit, PA as needed. No red flag symptoms noted upon exam today.     Meds & Orders: No orders of the defined types were placed in this  encounter.  No orders of the defined types were placed in this encounter.   Follow-up: Return if symptoms worsen or fail to improve.   Procedures: No procedures performed      Clinical History: CLINICAL DATA:  Initial evaluation for chronic lower back pain.   EXAM: MRI LUMBAR SPINE WITHOUT CONTRAST   TECHNIQUE: Multiplanar, multisequence MR imaging of the lumbar spine was performed. No intravenous contrast was administered.   COMPARISON:  Prior radiograph from 03/24/2019.   FINDINGS: Segmentation: Standard. Lowest well-formed disc space labeled the L5-S1 level.   Alignment: Physiologic with preservation of the normal lumbar lordosis. No listhesis.   Vertebrae: Vertebral body height maintained without acute or chronic fracture. Few small chronic endplate Schmorl's node deformities noted about the T11-12 and L1-2 interspaces. Bone marrow signal intensity within normal limits. No discrete or worrisome osseous lesions. No abnormal marrow edema.   Conus medullaris and cauda equina: Conus extends to the L1 level. Conus and cauda equina appear normal.   Paraspinal and other soft tissues: Paraspinous soft tissues within normal limits. Few small simple T2 hyperintense cyst noted about the visualized kidneys, benign in appearance, no follow-up imaging recommended. Visualized visceral structures otherwise unremarkable.   Disc levels:   T11-12: Small chronic endplate Schmorl's node deformities without disc bulge. No stenosis.   T12-L1: Tiny chronic endplate Schmorl's node deformity without disc bulge. No stenosis.   L1-2: Mild disc desiccation with disc bulge, with superimposed small chronic endplate Schmorl's node deformities. No spinal stenosis. Foramina remain patent.   L2-3:  Unremarkable.   L3-4: Minimal left eccentric disc bulge. Minor facet hypertrophy. No significant spinal stenosis. Mild bilateral L3 foraminal stenosis, greater on the left.   L4-5: Normal  interspace. Mild bilateral facet hypertrophy. No spinal stenosis. Mild left L4 foraminal narrowing. Right neural foramen remains patent.   L5-S1: Tiny central disc protrusion minimally indents the ventral thecal sac (series 5, image 34). No significant canal or lateral recess stenosis. Foramina remain patent.   IMPRESSION: 1. Tiny central disc protrusion at L5-S1 without stenosis or impingement. 2. Mild bilateral L3 and left L4 foraminal stenosis related to disc bulge and facet hypertrophy. 3. Mild noncompressive disc bulging at L1-2 without stenosis or impingement.     Electronically Signed   By: Rise Mu M.D.   On: 07/28/2022 18:38   She reports that she has been smoking cigarettes. She has never used smokeless tobacco. No results for input(s): "HGBA1C", "LABURIC" in the last 8760 hours.  Objective:  VS:  HT:    WT:   BMI:     BP:112/76  HR:94bpm  TEMP: ( )  RESP:  Physical Exam Vitals and nursing note reviewed.  HENT:     Head: Normocephalic and atraumatic.     Right Ear: External ear normal.     Left Ear: External ear normal.     Nose: Nose normal.     Mouth/Throat:     Mouth: Mucous membranes are moist.  Eyes:     Extraocular Movements: Extraocular movements intact.  Cardiovascular:     Rate and Rhythm: Normal rate.  Pulses: Normal pulses.  Pulmonary:     Effort: Pulmonary effort is normal.  Abdominal:     General: Abdomen is flat. There is distension.  Musculoskeletal:        General: Tenderness present.     Cervical back: Normal range of motion.     Comments: Patient rises from seated position to standing without difficulty. Good lumbar range of motion. No pain noted with facet loading. 5/5 strength noted with bilateral hip flexion, knee flexion/extension, ankle dorsiflexion/plantarflexion and EHL. No clonus noted bilaterally. No pain upon palpation of greater trochanters. No pain with internal/external rotation of bilateral hips. Sensation  intact bilaterally. Tenderness noted to cervical, thoracic and lumbar paraspinal regions upon palpation. Negative slump test bilaterally. Antalgic gait.     Skin:    General: Skin is warm and dry.     Capillary Refill: Capillary refill takes less than 2 seconds.  Neurological:     General: No focal deficit present.     Mental Status: She is alert and oriented to person, place, and time.  Psychiatric:        Mood and Affect: Mood normal.        Behavior: Behavior normal.     Ortho Exam  Imaging: No results found.  Past Medical/Family/Surgical/Social History: Medications & Allergies reviewed per EMR, new medications updated. Patient Active Problem List   Diagnosis Date Noted   Cough variant asthma vs UACS with VCD 04/30/2023   Acute hypoxic respiratory failure (HCC) 03/06/2023   CAP (community acquired pneumonia) 01/18/2023   Sepsis (HCC) 01/18/2023   Atypical chest pain 01/18/2023   Sinus tachycardia 01/18/2023   GAD (generalized anxiety disorder) 01/18/2023   GERD (gastroesophageal reflux disease) 01/18/2023   Mild intermittent asthma 01/18/2023   Essential hypertension 01/18/2023   Fracture, stress, femur, neck, initial encounter 11/22/2022   Vitamin D deficiency 11/28/2020   Osteopenia 11/08/2020   Knee contusion 04/06/2019   Bipolar 1 disorder (HCC) 06/16/2018   Chronic post-traumatic stress disorder 06/16/2018   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   Opioid use disorder, mild, in sustained remission (HCC) 06/16/2018   S/P cholecystectomy 02/10/2018   Chronic recurrent pancreatitis (HCC) 01/06/2018   S/P cervical spinal fusion 12/08/2017   Protrusion of cervical intervertebral disc 12/08/2017   Anxiety 07/10/2017   Chronic vulvitis 07/10/2017   Irritable bowel syndrome 07/10/2017   Methicillin resistant Staphylococcus aureus infection 07/10/2017   Migraine 07/10/2017   Constipation 06/16/2017   Overweight (BMI 25.0-29.9) 11/11/2016   Acute respiratory  insufficiency 06/21/2016   Asthmatic bronchitis with acute exacerbation 06/21/2016   Tobacco abuse disorder 06/21/2016   Fibromyalgia 10/09/2015   Hypercholesterolemia 10/09/2015   Thrombocytosis 09/13/2015   HNP (herniated nucleus pulposus), cervical 10/02/2012    Class: Diagnosis of   Past Medical History:  Diagnosis Date   Anxiety    Arthritis    Bipolar 1 disorder, manic, mild (HCC)    CKD (chronic kidney disease)    Depression    Fibromyalgia    GERD (gastroesophageal reflux disease)    Headache(784.0)    Palpitations    Family History  Problem Relation Age of Onset   Bipolar disorder Mother    Diabetes Mother    Hypertension Mother    Hypertension Father    Colon cancer Paternal Grandmother    Past Surgical History:  Procedure Laterality Date   ANTERIOR CERVICAL DECOMP/DISCECTOMY FUSION N/A 10/02/2012   Procedure: C4-5, C5-6 anterior cervical discectomy and fusion, allograft plate;  Surgeon: Eldred Manges, MD;  Location:  [facility] OR;  Service: Orthopedics;  Laterality: N/A;   CHOLECYSTECTOMY     CYSTOGRAPHY     HIP PINNING,CANNULATED Right 11/22/2022   Procedure: RIGHT HIP PINNING;  Surgeon: Tarry Kos, MD;  Location: [facility] OR;  Service: Orthopedics;  Laterality: Right;   LAPAROSCOPIC TOTAL HYSTERECTOMY     OOPHORECTOMY     TUBAL LIGATION     Social History   Occupational History   Not on file  Tobacco Use   Smoking status: Some Days    Current packs/day: 0.50    Types: Cigarettes   Smokeless tobacco: Never  Vaping Use   Vaping status: Never Used  Substance and Sexual Activity   Alcohol use: Yes    Comment: rarely   Drug use: Yes    Types: Marijuana    Comment: occasionally   Sexual activity: Not on file

## 2023-05-30 ENCOUNTER — Other Ambulatory Visit (HOSPITAL_BASED_OUTPATIENT_CLINIC_OR_DEPARTMENT_OTHER): Payer: Managed Care, Other (non HMO)

## 2023-05-30 ENCOUNTER — Ambulatory Visit
Admission: RE | Admit: 2023-05-30 | Discharge: 2023-05-30 | Disposition: A | Discharge status: Home / Self Care | Payer: Commercial Managed Care - HMO | Source: Ambulatory Visit | Attending: Internal Medicine | Admitting: Internal Medicine

## 2023-05-30 DIAGNOSIS — J45991 Cough variant asthma: Secondary | ICD-10-CM

## 2023-06-05 ENCOUNTER — Encounter (HOSPITAL_BASED_OUTPATIENT_CLINIC_OR_DEPARTMENT_OTHER): Payer: Self-pay | Admitting: Student

## 2023-06-05 ENCOUNTER — Telehealth (HOSPITAL_BASED_OUTPATIENT_CLINIC_OR_DEPARTMENT_OTHER): Payer: Self-pay

## 2023-06-05 ENCOUNTER — Ambulatory Visit (HOSPITAL_BASED_OUTPATIENT_CLINIC_OR_DEPARTMENT_OTHER): Payer: Commercial Managed Care - HMO | Admitting: Student

## 2023-06-05 DIAGNOSIS — M545 Low back pain, unspecified: Secondary | ICD-10-CM | POA: Diagnosis not present

## 2023-06-05 DIAGNOSIS — G8929 Other chronic pain: Secondary | ICD-10-CM

## 2023-06-05 DIAGNOSIS — M25551 Pain in right hip: Secondary | ICD-10-CM | POA: Diagnosis not present

## 2023-06-05 NOTE — Telephone Encounter (Signed)
Patient came in to the walk-in clinic again today with [facility] complaining of right hip and leg pain. She stated she tried to get in with Dr. Roda Shutters but was told he didn't have any opening for three weeks. Jean Rosenthal would like for her to get in with Dr. Roda Shutters. I scheduled her with Mardella Layman for 11/8 because pt wants to be seen asap. Would you be able to work her in with Dr. Roda Shutters sooner?

## 2023-06-05 NOTE — Progress Notes (Signed)
Chief Complaint: Right leg pain     History of Present Illness:   06/05/23: Patient is here today for evaluation of pain in her right leg.  Patient states that she feels like her right leg is swollen and tight.  Most of her pain is located in the right hip which she rates as moderate to severe.  Pain is located over the lateral hip and groin and has been worsening.  Does report some pain in the low back.  Has been taking baclofen without much benefit.  States that the practice she sees for her spine and pain management has had some type of billing issue with her insurance and she is unable to be seen until that is resolved.   05/22/2023: Shannon Spears is a 42 y.o. female presenting today with pain in her right hip and right knee.  She is a patient of Dr. Roda Shutters, who performed pinning of the right hip in April due to a stress fracture.  Patient states that yesterday she had a fall onto her right side after she missed a step outside of her home.  She has pain in the left hip mainly over the lateral side, however there is some groin pain.  Pain is located all throughout the right knee.  She does also have some pain in the right side low back however she does see a spine specialist for this.  Has had trouble and pain laying on her right side.  Typically takes Flexeril on a daily basis but has tried baclofen since the fall which has not helped.  Surgical History:   Right hip pinning-11/22/2022  PMH/PSH/Family History/Social History/Meds/Allergies:    Past Medical History:  Diagnosis Date   Anxiety    Arthritis    Bipolar 1 disorder, manic, mild (HCC)    CKD (chronic kidney disease)    Depression    Fibromyalgia    GERD (gastroesophageal reflux disease)    Headache(784.0)    Palpitations    Past Surgical History:  Procedure Laterality Date   ANTERIOR CERVICAL DECOMP/DISCECTOMY FUSION N/A 10/02/2012   Procedure: C4-5, C5-6 anterior cervical discectomy and fusion,  allograft plate;  Surgeon: Eldred Manges, MD;  Location: [facility] OR;  Service: Orthopedics;  Laterality: N/A;   CHOLECYSTECTOMY     CYSTOGRAPHY     HIP PINNING,CANNULATED Right 11/22/2022   Procedure: RIGHT HIP PINNING;  Surgeon: Tarry Kos, MD;  Location: [facility] OR;  Service: Orthopedics;  Laterality: Right;   LAPAROSCOPIC TOTAL HYSTERECTOMY     OOPHORECTOMY     TUBAL LIGATION     Social History   Socioeconomic History   Marital status: Married    Spouse name: Not on file   Number of children: Not on file   Years of education: Not on file   Highest education level: Not on file  Occupational History   Not on file  Tobacco Use   Smoking status: Some Days    Current packs/day: 0.50    Types: Cigarettes   Smokeless tobacco: Never  Vaping Use   Vaping status: Never Used  Substance and Sexual Activity   Alcohol use: Yes    Comment: rarely   Drug use: Yes    Types: Marijuana    Comment: occasionally   Sexual activity: Not on file  Other Topics Concern  Not on file  Social History Narrative   Not on file   Social Determinants of Health   Financial Resource Strain: Low Risk  (01/15/2023)   Received from [facility], [facility]   Overall Financial Resource Strain (CARDIA)    Difficulty of Paying Living Expenses: Not hard at all  Food Insecurity: No Food Insecurity (01/18/2023)   Hunger Vital Sign    Worried About Running Out of Food in the Last Year: Never true    Ran Out of Food in the Last Year: Never true  Transportation Needs: No Transportation Needs (01/18/2023)   PRAPARE - Administrator, Civil Service (Medical): No    Lack of Transportation (Non-Medical): No  Physical Activity: Sufficiently Active (09/24/2021)   Received from [facility], [facility]   Exercise Vital Sign    Days of Exercise per Week: 7 days    Minutes of Exercise per Session: 40 min  Stress: Stress Concern Present (09/24/2021)   Received from [facility], [facility] - Occupational Stress Questionnaire    Feeling of Stress : Very much  Social Connections: Unknown (12/02/2021)   Received from [facility], [facility]   Social Network    Social Network: Not on file   Family History  Problem Relation Age of Onset   Bipolar disorder Mother    Diabetes Mother    Hypertension Mother    Hypertension Father    Colon cancer Paternal Grandmother    Allergies  Allergen Reactions   Buprenorphine Shortness Of Breath and Rash    Breathing problems   Penicillins Anaphylaxis, Nausea And Vomiting and Swelling    TOLERATES ANCEF   Gabapentin Nausea And Vomiting   Nsaids     Renal insufficiency   Propoxyphene Nausea Only    darvocet   Tetanus Toxoids     Blister on legs, MRSA   Adhesive [Tape] Dermatitis   Codeine     Mouth went numb   Ibuprofen     Has kidney disease.   Doxycycline Rash   Current Outpatient Medications  Medication Sig Dispense Refill   albuterol (VENTOLIN HFA) 108 (90 Base) MCG/ACT inhaler Inhale 2 puffs into the lungs every 6 (six) hours as needed for wheezing or shortness of breath. 6.7 g 2   Albuterol-Budesonide (AIRSUPRA) 90-80 MCG/ACT AERO Inhale 2 puffs into the lungs every 4 (four) hours as needed. 10.7 g 11   azelastine (ASTELIN) 0.1 % nasal spray Place 1 spray into both nostrils 2 (two) times daily.     CALCIUM-VITAMIN D PO Take 1 tablet by mouth daily.     cyclobenzaprine (FLEXERIL) 10 MG tablet Take 1 tablet (10 mg total) by mouth 3 (three) times daily as needed for muscle spasms. 60 tablet 1   diazepam (VALIUM) 10 MG tablet Take 1 tablet (10 mg total) by mouth 3 (three) times daily as needed for anxiety. 90 tablet 2   famotidine (PEPCID AC MAXIMUM STRENGTH) 20 MG tablet Take 20 mg by mouth 2 (two) times daily.     fluticasone-salmeterol (ADVAIR) 100-50 MCG/ACT AEPB Inhale into the lungs.     hydrOXYzine (ATARAX) 25 MG tablet TAKE 1 TABLET BY MOUTH UP TO 6 TIMES PERDAY AS NEEDED FOR  ANXIETY, ITCHING, AND INSOMNIA 180 tablet 2   ipratropium (ATROVENT) 0.06 % nasal spray Place into the nose.     Lactobacillus (PROBIOTIC ACIDOPHILUS PO) Take 1 capsule by mouth daily.     lamoTRIgine (LAMICTAL)  200 MG tablet Take 1 tablet (200 mg total) by mouth at bedtime. 30 tablet 5   levETIRAcetam (KEPPRA) 500 MG tablet Take 1,000 mg by mouth 2 (two) times daily.     levothyroxine (SYNTHROID) 25 MCG tablet Take 25 mcg by mouth daily.     methocarbamol (ROBAXIN-750) 750 MG tablet Take 1 tablet (750 mg total) by mouth 2 (two) times daily as needed for muscle spasms. (Patient not taking: Reported on 03/07/2023) 20 tablet 2   metoprolol succinate (TOPROL-XL) 25 MG 24 hr tablet Take 37.5 mg by mouth at bedtime.     montelukast (SINGULAIR) 10 MG tablet Take 10 mg by mouth daily.     nystatin (MYCOSTATIN) 100000 UNIT/ML suspension Take by mouth.     ondansetron (ZOFRAN-ODT) 4 MG disintegrating tablet Take 1 tablet (4 mg total) by mouth every 8 (eight) hours as needed for nausea or vomiting. 20 tablet 0   pantoprazole (PROTONIX) 40 MG tablet Take 1 tablet (40 mg total) by mouth daily. Take 30-60 min before first meal of the day 30 tablet 2   Vitamin D, Ergocalciferol, 50000 units CAPS Take 1 capsule by mouth once a week. Thursday     No current facility-administered medications for this visit.   No results found.  Review of Systems:   A ROS was performed including pertinent positives and negatives as documented in the HPI.  Physical Exam :   Constitutional: NAD and appears stated age Neurological: Alert and oriented Psych: Appropriate affect and cooperative Last menstrual period 10/02/2012.   Comprehensive Musculoskeletal Exam:    Tenderness with palpation over the lumbar spinous processes and right lumbar area.  Significant tenderness to palpation over the right greater trochanter.  Right hip flexion ROM to 100 degrees active and 110 degrees passive.  There is some mild edema noted in the  right lower extremity and ankle without erythema or warmth.  No calf tenderness.  Imaging:     Assessment:   42 y.o. female with pain in her low back and right hip.  I do believe a lot of her issues are emanating from the lumbar spine and have encouraged follow-up with the practice she sees for her back and pain management.  Does also have history of fibromyalgia.  I have also recommended a follow-up with Dr. Roda Shutters to ensure that none of this relates to her hip pinning from April.  Can continue muscle relaxers as needed.  I unfortunately do not have much more to provide for her in terms of pain.  She is a plan to follow-up as needed.  Plan :    -Follow-up with Dr. Roda Shutters or Mardella Layman for recheck -Follow-up with pain management     I personally saw and evaluated the patient, and participated in the management and treatment plan.  Hazle Nordmann, PA-C Orthopedics

## 2023-06-06 NOTE — Telephone Encounter (Signed)
No urgency.  She has chronic pain issues.  Appt with lindsey is fine.

## 2023-06-13 ENCOUNTER — Encounter: Payer: Self-pay | Admitting: Physician Assistant

## 2023-06-13 ENCOUNTER — Ambulatory Visit (INDEPENDENT_AMBULATORY_CARE_PROVIDER_SITE_OTHER): Payer: Managed Care, Other (non HMO) | Admitting: Physician Assistant

## 2023-06-13 ENCOUNTER — Ambulatory Visit (HOSPITAL_COMMUNITY)
Admission: RE | Admit: 2023-06-13 | Discharge: 2023-06-13 | Disposition: A | Discharge status: Home / Self Care | Payer: Commercial Managed Care - HMO | Source: Ambulatory Visit | Attending: Physician Assistant | Admitting: Physician Assistant

## 2023-06-13 DIAGNOSIS — M79604 Pain in right leg: Secondary | ICD-10-CM | POA: Diagnosis present

## 2023-06-13 NOTE — Progress Notes (Signed)
Lower extremity venous duplex completed. Please see CV Procedures for preliminary results.  Shona Simpson, RVT 06/13/23 3:12 PM

## 2023-06-13 NOTE — Progress Notes (Signed)
Post-Op Visit Note   Patient: Shannon Spears           Date of Birth: January 29, 1981           MRN: [id number] Visit Date: 06/13/2023 PCP: Susann Givens, PA-C   Assessment & Plan:  Chief Complaint:  Chief Complaint  Patient presents with   Right Hip - Pain   Visit Diagnoses:  1. Pain in right leg     Plan: Shearon comes in with right hip pain.  She is approximately 7 months status post right hip cannulated pinning for a femoral neck stress fracture, date of surgery 11/22/2022.  She was seen by Hazle Nordmann on 05/22/2023 after having sustained a fall.  X-rays were unremarkable.  She was to continue her muscle relaxers at that time.  She was also prescribed tramadol.  She was seen again by Jean Rosenthal on 06/05/2023 with right hip pain to the lateral and wound which had worsened.  She is also complaining of some low back pain at that time.  Muscle relaxers are no longer helping.  At that time, it was thought that a lot of her pain was referred from the lumbar spine.  She was encouraged to follow-up with her spine specialist/pain management doctor.  She is here today for follow-up with [place].  She complains primarily of right leg swelling from her groin to her foot at today's visit.  She has associated pain but this seems to correlate primarily with the swelling.  The symptoms are constant.  She has been taking baclofen without relief.  She does note burning as well as numbness to the bottom of both feet.  She has seen her primary care provider who was recently referred her to a new spine specialist.  Of note, she does have a history of what sounds like superficial thrombosis x 2 left upper extremity.  Currently not on any hormone replacement therapy.  She does smoke a half a pack cigarettes a day.  Not on any anticoagulants.  Examination of her right lower extremity: Moderate swelling throughout.  She does have 1+ pitting edema to right ankle.  Calf is moderately tender.  She is tight.  Negative  Homans.  2+ pulses dorsalis pedis posterior tibial.  No pain with logroll, FADIR or Stinchfield testing.  Negative straight leg raise.  She is neurovascularly intact distally.  Impression is chronic right leg swelling with associated pain.  I do not feel her symptoms are related to her right hip pinning or are referred from the right hip joint.  I believe her pain seems to correlate a lot with the swelling but could also be referred from her lumbar spine.  I would like to first obtain an ultrasound of the right lower extremity to rule out DVT.  I have discussed that we are happy to refer her to one of our spine doctors here but she would like to wait for the appointment with the spine specialist that her PCP is recommended.  She will follow-up with [place] as needed.  Follow-Up Instructions: Return if symptoms worsen or fail to improve.   Orders:  Orders Placed This Encounter  Procedures   VAS [place] LOWER EXTREMITY VENOUS (DVT)   No orders of the defined types were placed in this encounter.   Imaging: No results found.  PMFS History: Patient Active Problem List   Diagnosis Date Noted   Cough variant asthma vs UACS with VCD 04/30/2023   Acute hypoxic respiratory failure (HCC) 03/06/2023  CAP (community acquired pneumonia) 01/18/2023   Sepsis (HCC) 01/18/2023   Atypical chest pain 01/18/2023   Sinus tachycardia 01/18/2023   GAD (generalized anxiety disorder) 01/18/2023   GERD (gastroesophageal reflux disease) 01/18/2023   Mild intermittent asthma 01/18/2023   Essential hypertension 01/18/2023   Fracture, stress, femur, neck, initial encounter 11/22/2022   Vitamin D deficiency 11/28/2020   Osteopenia 11/08/2020   Knee contusion 04/06/2019   Bipolar 1 disorder (HCC) 06/16/2018   Chronic post-traumatic stress disorder 06/16/2018   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   Opioid use disorder, mild, in sustained remission (HCC) 06/16/2018   S/P cholecystectomy 02/10/2018   Chronic recurrent  pancreatitis (HCC) 01/06/2018   S/P cervical spinal fusion 12/08/2017   Protrusion of cervical intervertebral disc 12/08/2017   Anxiety 07/10/2017   Chronic vulvitis 07/10/2017   Irritable bowel syndrome 07/10/2017   Methicillin resistant Staphylococcus aureus infection 07/10/2017   Migraine 07/10/2017   Constipation 06/16/2017   Overweight (BMI 25.0-29.9) 11/11/2016   Acute respiratory insufficiency 06/21/2016   Asthmatic bronchitis with acute exacerbation 06/21/2016   Tobacco abuse disorder 06/21/2016   Fibromyalgia 10/09/2015   Hypercholesterolemia 10/09/2015   Thrombocytosis 09/13/2015   HNP (herniated nucleus pulposus), cervical 10/02/2012    Class: Diagnosis of   Past Medical History:  Diagnosis Date   Anxiety    Arthritis    Bipolar 1 disorder, manic, mild (HCC)    CKD (chronic kidney disease)    Depression    Fibromyalgia    GERD (gastroesophageal reflux disease)    Headache(784.0)    Palpitations     Family History  Problem Relation Age of Onset   Bipolar disorder Mother    Diabetes Mother    Hypertension Mother    Hypertension Father    Colon cancer Paternal Grandmother     Past Surgical History:  Procedure Laterality Date   ANTERIOR CERVICAL DECOMP/DISCECTOMY FUSION N/A 10/02/2012   Procedure: C4-5, C5-6 anterior cervical discectomy and fusion, allograft plate;  Surgeon: Eldred Manges, MD;  Location: [facility] OR;  Service: Orthopedics;  Laterality: N/A;   CHOLECYSTECTOMY     CYSTOGRAPHY     HIP PINNING,CANNULATED Right 11/22/2022   Procedure: RIGHT HIP PINNING;  Surgeon: Tarry Kos, MD;  Location: [facility] OR;  Service: Orthopedics;  Laterality: Right;   LAPAROSCOPIC TOTAL HYSTERECTOMY     OOPHORECTOMY     TUBAL LIGATION     Social History   Occupational History   Not on file  Tobacco Use   Smoking status: Some Days    Current packs/day: 0.50    Types: Cigarettes   Smokeless tobacco: Never  Vaping Use   Vaping status: Never Used  Substance and Sexual  Activity   Alcohol use: Yes    Comment: rarely   Drug use: Yes    Types: Marijuana    Comment: occasionally   Sexual activity: Not on file

## 2023-06-17 ENCOUNTER — Other Ambulatory Visit: Payer: Self-pay | Admitting: Adult Health

## 2023-06-17 DIAGNOSIS — F451 Undifferentiated somatoform disorder: Secondary | ICD-10-CM

## 2023-06-17 DIAGNOSIS — F4312 Post-traumatic stress disorder, chronic: Secondary | ICD-10-CM

## 2023-06-17 NOTE — Telephone Encounter (Signed)
Lvm with pt that rx is ready to be picked up at the pharmacy.

## 2023-06-24 ENCOUNTER — Encounter: Payer: Self-pay | Admitting: Adult Health

## 2023-06-24 ENCOUNTER — Ambulatory Visit (INDEPENDENT_AMBULATORY_CARE_PROVIDER_SITE_OTHER): Payer: 59 | Admitting: Adult Health

## 2023-06-24 DIAGNOSIS — F451 Undifferentiated somatoform disorder: Secondary | ICD-10-CM

## 2023-06-24 DIAGNOSIS — F3175 Bipolar disorder, in partial remission, most recent episode depressed: Secondary | ICD-10-CM

## 2023-06-24 DIAGNOSIS — F4312 Post-traumatic stress disorder, chronic: Secondary | ICD-10-CM | POA: Diagnosis not present

## 2023-06-24 DIAGNOSIS — F411 Generalized anxiety disorder: Secondary | ICD-10-CM | POA: Diagnosis not present

## 2023-06-24 MED ORDER — LAMOTRIGINE 200 MG PO TABS: 200.0000 mg | ORAL_TABLET | Freq: Every day | ORAL | 5 refills | Status: DC

## 2023-06-24 MED ORDER — DIAZEPAM 10 MG PO TABS: 10.0000 mg | ORAL_TABLET | Freq: Three times a day (TID) | ORAL | 2 refills | Status: DC | PRN

## 2023-06-24 NOTE — Progress Notes (Signed)
Shannon Spears [id number] Jun 02, 1981 42 y.o.  Subjective:   Patient ID:  Shannon Spears is a 42 y.o. (DOB Apr 22, 1981) female.  Chief Complaint: No chief complaint on file.   HPI Shannon Spears presents to the office today for follow-up of BPD-1, PTSD, anxiety, and somatic symptom disorder.  Describes mood today as "ok". Pleasant. Tearful at times. Mood symptoms - denies depression. Reports anxiety and irritability. Reports panic attacks. Reports worry, rumination, and over thinking. Reports ongoing situational stressors. Reports increased health issues. Reports mood fluctuates. Stating "I've had a lot of health issues". Feels like medications are helpful. Family doing well. Stable interest and motivation. Taking medications as prescribed. Energy levels lower. Active, does not have a regular exercise routine with physical disabilities. Enjoys some usual interests and activities. Married. Lives with husband and daughter. Spending time with family. Reports appetite adequate. Reports weight gain - 200 pounds. Sleeps better some nights than others. Averages 6 hours. Focus and concentration difficulties - "kinda off with everything that's going on". Completing tasks. Managing aspects of household. Stay at home mom. Denies SI or HI.  Denies AH or VH. Denies self harm.  Denies substance use.  Followed by pain management - [facility].  Previous medication trials: Unknown    GAD-7    Flowsheet Row Office Visit from 02/01/2019 in [facility] at [facility]  Total GAD-7 Score 5      PHQ2-9    Flowsheet Row Office Visit from 02/01/2019 in [facility] at [facility] Total Score 0  PHQ-9 Total Score 5      Flowsheet Row ED from 04/21/2023 in [facility] Emergency Department at [facility] ED from 03/06/2023 in [facility] Emergency Department at [facility] ED to Hosp-Admission (Discharged) from 01/18/2023 in [facility]  C-SSRS RISK CATEGORY No Risk No Risk No Risk        Review of Systems:  Review of Systems  Musculoskeletal:  Negative for gait problem.  Neurological:  Negative for tremors.  Psychiatric/Behavioral:         Please refer to HPI    Medications: I have reviewed the patient's current medications.  Current Outpatient Medications  Medication Sig Dispense Refill   albuterol (VENTOLIN HFA) 108 (90 Base) MCG/ACT inhaler Inhale 2 puffs into the lungs every 6 (six) hours as needed for wheezing or shortness of breath. 6.7 g 2   Albuterol-Budesonide (AIRSUPRA) 90-80 MCG/ACT AERO Inhale 2 puffs into the lungs every 4 (four) hours as needed. 10.7 g 11   azelastine (ASTELIN) 0.1 % nasal spray Place 1 spray into both nostrils 2 (two) times daily.     CALCIUM-VITAMIN D PO Take 1 tablet by mouth daily.     cyclobenzaprine (FLEXERIL) 10 MG tablet Take 1 tablet (10 mg total) by mouth 3 (three) times daily as needed for muscle spasms. 60 tablet 1   diazepam (VALIUM) 10 MG tablet Take 1 tablet (10 mg total) by mouth 3 (three) times daily as needed for anxiety. 90 tablet 2   famotidine (PEPCID AC MAXIMUM STRENGTH) 20 MG tablet Take 20 mg by mouth 2 (two) times daily.     fluticasone-salmeterol (ADVAIR) 100-50 MCG/ACT AEPB Inhale into the lungs.     hydrOXYzine (ATARAX) 25 MG tablet TAKE 1 TABLET BY MOUTH UP TO 6 TIMES A DAY AS NEEDED FOR ANXIETY, ITCHING & SLEEP 180 tablet 0   ipratropium (ATROVENT) 0.06 % nasal spray Place into the nose.  Lactobacillus (PROBIOTIC ACIDOPHILUS PO) Take 1 capsule by mouth daily.     lamoTRIgine (LAMICTAL) 200 MG tablet Take 1 tablet (200 mg total) by mouth at bedtime. 30 tablet 5   levETIRAcetam (KEPPRA) 500 MG tablet Take 1,000 mg by mouth 2 (two) times daily.     levothyroxine (SYNTHROID) 25 MCG tablet Take 25 mcg by mouth daily.     methocarbamol (ROBAXIN-750) 750 MG tablet Take 1 tablet (750 mg total) by mouth 2 (two) times daily as needed for muscle spasms. 20  tablet 2   metoprolol succinate (TOPROL-XL) 25 MG 24 hr tablet Take 37.5 mg by mouth at bedtime.     montelukast (SINGULAIR) 10 MG tablet Take 10 mg by mouth daily.     nystatin (MYCOSTATIN) 100000 UNIT/ML suspension Take by mouth.     ondansetron (ZOFRAN-ODT) 4 MG disintegrating tablet Take 1 tablet (4 mg total) by mouth every 8 (eight) hours as needed for nausea or vomiting. 20 tablet 0   pantoprazole (PROTONIX) 40 MG tablet Take 1 tablet (40 mg total) by mouth daily. Take 30-60 min before first meal of the day 30 tablet 2   Vitamin D, Ergocalciferol, 50000 units CAPS Take 1 capsule by mouth once a week. Thursday     No current facility-administered medications for this visit.    Medication Side Effects: None  Allergies:  Allergies  Allergen Reactions   Buprenorphine Shortness Of Breath and Rash    Breathing problems   Penicillins Anaphylaxis, Nausea And Vomiting and Swelling    TOLERATES ANCEF   Gabapentin Nausea And Vomiting   Nsaids     Renal insufficiency   Propoxyphene Nausea Only    darvocet   Tetanus Toxoids     Blister on legs, MRSA   Adhesive [Tape] Dermatitis   Codeine     Mouth went numb   Ibuprofen     Has kidney disease.   Doxycycline Rash    Past Medical History:  Diagnosis Date   Anxiety    Arthritis    Bipolar 1 disorder, manic, mild (HCC)    CKD (chronic kidney disease)    Depression    Fibromyalgia    GERD (gastroesophageal reflux disease)    Headache(784.0)    Palpitations     Past Medical History, Surgical history, Social history, and Family history were reviewed and updated as appropriate.   Please see review of systems for further details on the patient's review from today.   Objective:   Physical Exam:  LMP 10/02/2012   Physical Exam Constitutional:      General: She is not in acute distress. Musculoskeletal:        General: No deformity.  Neurological:     Mental Status: She is alert and oriented to person, place, and time.      Coordination: Coordination normal.  Psychiatric:        Attention and Perception: Attention and perception normal. She does not perceive auditory or visual hallucinations.        Mood and Affect: Mood normal. Mood is not anxious or depressed. Affect is not labile, blunt, angry or inappropriate.        Speech: Speech normal.        Behavior: Behavior normal.        Thought Content: Thought content normal. Thought content is not paranoid or delusional. Thought content does not include homicidal or suicidal ideation. Thought content does not include homicidal or suicidal plan.        Cognition  and Memory: Cognition and memory normal.        Judgment: Judgment normal.     Comments: Insight intact     Lab Review:     Component Value Date/Time   NA 139 04/21/2023 1035   NA 135 02/01/2019 1012   K 4.1 04/21/2023 1035   CL 102 04/21/2023 1035   CO2 27 04/21/2023 1035   GLUCOSE 106 (H) 04/21/2023 1035   BUN 11 04/21/2023 1035   BUN 8 02/01/2019 1012   CREATININE 0.86 04/21/2023 1035   CALCIUM 8.7 (L) 04/21/2023 1035   PROT 6.8 03/07/2023 0256   PROT 6.9 02/01/2019 1012   ALBUMIN 2.9 (L) 03/07/2023 0256   ALBUMIN 4.1 02/01/2019 1012   AST 18 03/07/2023 0256   ALT 23 03/07/2023 0256   ALKPHOS 136 (H) 03/07/2023 0256   BILITOT 0.2 (L) 03/07/2023 0256   BILITOT 0.3 02/01/2019 1012   GFRNONAA >60 04/21/2023 1035   GFRAA 127 02/01/2019 1012       Component Value Date/Time   WBC 10.3 04/21/2023 1035   RBC 4.62 04/21/2023 1035   HGB 13.1 04/21/2023 1035   HGB 14.2 02/01/2019 1012   HCT 41.0 04/21/2023 1035   HCT 41.8 02/01/2019 1012   PLT 437 (H) 04/21/2023 1035   PLT 518 (H) 02/01/2019 1012   MCV 88.7 04/21/2023 1035   MCV 96 02/01/2019 1012   MCH 28.4 04/21/2023 1035   MCHC 32.0 04/21/2023 1035   RDW 13.9 04/21/2023 1035   RDW 13.6 02/01/2019 1012   LYMPHSABS 1.2 03/07/2023 0256   LYMPHSABS 3.7 (H) 02/01/2019 1012   MONOABS 0.1 03/07/2023 0256   EOSABS 0.0 03/07/2023 0256    EOSABS 0.3 02/01/2019 1012   BASOSABS 0.0 03/07/2023 0256   BASOSABS 0.1 02/01/2019 1012    No results found for: "POCLITH", "LITHIUM"   No results found for: "PHENYTOIN", "PHENOBARB", "VALPROATE", "CBMZ"   .res Assessment: Plan:    Plan:  PDMP reviewed  1. Lamictal 200mg  daily 2. Valium 10mg  TID  3. Atarax 25mg  up to 6 times daily  RTC 3 months  Patient advised to contact office with any questions, adverse effects, or acute worsening in signs and symptoms.  Discussed potential benefits, risk, and side effects of benzodiazepines to include potential risk of tolerance and dependence, as well as possible drowsiness.  Advised patient not to drive if experiencing drowsiness and to take lowest possible effective dose to minimize risk of dependence and tolerance.  Counseled patient regarding potential benefits, risks, and side effects of Lamictal to include potential risk of Stevens-Johnson syndrome. Advised patient to stop taking Lamictal and contact office immediately if rash develops and to seek urgent medical attention if rash is severe and/or spreading quickly. Diagnoses and all orders for this visit:  Bipolar I disorder, current or most recent episode depressed, in partial remission with mixed features (HCC) -     diazepam (VALIUM) 10 MG tablet; Take 1 tablet (10 mg total) by mouth 3 (three) times daily as needed for anxiety. -     lamoTRIgine (LAMICTAL) 200 MG tablet; Take 1 tablet (200 mg total) by mouth at bedtime.  Chronic post-traumatic stress disorder -     lamoTRIgine (LAMICTAL) 200 MG tablet; Take 1 tablet (200 mg total) by mouth at bedtime.  Somatic symptom disorder  Generalized anxiety disorder     Please see After Visit Summary for patient specific instructions.  Future Appointments  Date Time Provider Department Center  07/01/2023  2:30 PM Sandrea Hughs  B, MD [facility] None    No orders of the defined types were placed in this  encounter.   -------------------------------

## 2023-06-30 NOTE — Progress Notes (Deleted)
Shannon Spears, female    DOB: 01-09-1981   MRN: [id number]   Brief patient profile:  17 yof active smoker some passive exp growing up a tendency to "sinus infections" assoc URI with one ent eval in elementary school fall and winter the worst seemed to outgrow in middle school   referred to pulmonary clinic 04/30/2023 by Rayfield Citizen PA  for cough and sob       Tolerated pregnancy fine full term wth new baseline wt 150s  01/2012  and by 2014 > cx neck sugery Shannon Spears  Noted wt 2017 doe assoc with wt gain / seemed better if exercised p using saba at most once a day then had bad coughing fit on way to get tubes tied x 2017 no response to saba > d/c on neb     History of Present Illness  04/30/2023  Pulmonary/ 1st office eval/Shannon Spears maint on advair  Chief Complaint  Patient presents with   Establish Care    Hx of pna; allergy testing NEG; [facility] noted no asthma s/s  Dyspnea:  can walk at [employer] not [employer]/ also limited by R hip  Cough: flares with sinus dz and overt HB  Sleep: on couch with dogs  feels bad am  SABA use: rarely and never ex Rec My office will be contacting you by phone for referral for sinus CT  - if you don't hear back from my office within one week please call us back or notify us thru MyChart and we'll address it right away.  Stop wixella  Only use your [employer]  as a rescue medication Also  Ok to try Sirsupra  15 min before an activity (on alternating days)  that you know would usually make you short of breath  Work on inhaler technique:   Pantoprazole (protonix) 40 mg   Take  30-60 min before first meal of the day and Pepcid (famotidine)  20 mg after supper until return to office  GERD diet reviewed, bed blocks rec   Please schedule a follow up office visit in 6 weeks [place] office , call sooner if needed with all medications /inhalers/ solutions in hand    07/01/2023  f/u ov/Shannon Spears re: cough variant asthma vs uacs/vcd   maint on ***  No chief complaint on file.    Dyspnea:  *** Cough: *** Sleeping: *** resp cc  SABA use: *** 02: ***  Lung cancer screening :  ***    No obvious day to day or daytime variability or assoc excess/ purulent sputum or mucus plugs or hemoptysis or cp or chest tightness, subjective wheeze or overt sinus or hb symptoms.    Also denies any obvious fluctuation of symptoms with weather or environmental changes or other aggravating or alleviating factors except as outlined above   No unusual exposure hx or h/o childhood pna/ asthma or knowledge of premature birth.  Current Allergies, Complete Past Medical History, Past Surgical History, Family History, and Social History were reviewed in [employer] record.  ROS  The following are not active complaints unless bolded Hoarseness, sore throat, dysphagia, dental problems, itching, sneezing,  nasal congestion or discharge of excess mucus or purulent secretions, ear ache,   fever, chills, sweats, unintended wt loss or wt gain, classically pleuritic or exertional cp,  orthopnea pnd or arm/hand swelling  or leg swelling, presyncope, palpitations, abdominal pain, anorexia, nausea, vomiting, diarrhea  or change in bowel habits or change in bladder habits, change in  stools or change in urine, dysuria, hematuria,  rash, arthralgias, visual complaints, headache, numbness, weakness or ataxia or problems with walking or coordination,  change in mood or  memory.        No outpatient medications have been marked as taking for the 07/01/23 encounter (Appointment) with Nyoka Cowden, MD.             Past Medical History:  Diagnosis Date   Anxiety    Arthritis    Bipolar 1 disorder, manic, mild (HCC)    CKD (chronic kidney disease)    Depression    Fibromyalgia    GERD (gastroesophageal reflux disease)    Headache(784.0)    Palpitations       Objective:    Wts  07/01/2023      ***   04/30/23 194 lb (88 kg)  04/21/23 182 lb (82.6 kg)  03/06/23 180 lb  (81.6 kg)      Vital signs reviewed  07/01/2023  - Note at rest 02 sats  ***% on ***   General appearance:    ***                 Assessment

## 2023-07-01 ENCOUNTER — Ambulatory Visit: Payer: Managed Care, Other (non HMO) | Admitting: Internal Medicine

## 2023-07-01 ENCOUNTER — Institutional Professional Consult (permissible substitution): Payer: Commercial Managed Care - HMO | Admitting: Internal Medicine

## 2023-08-02 ENCOUNTER — Other Ambulatory Visit: Payer: Self-pay

## 2023-08-02 ENCOUNTER — Emergency Department (HOSPITAL_COMMUNITY)
Admission: EM | Admit: 2023-08-02 | Discharge: 2023-08-02 | Disposition: A | Discharge status: Home / Self Care | Payer: Commercial Managed Care - HMO | Attending: Emergency Medicine | Admitting: Emergency Medicine

## 2023-08-02 ENCOUNTER — Emergency Department (HOSPITAL_COMMUNITY): Payer: Commercial Managed Care - HMO

## 2023-08-02 ENCOUNTER — Encounter (HOSPITAL_COMMUNITY): Payer: Self-pay

## 2023-08-02 DIAGNOSIS — Z79899 Other long term (current) drug therapy: Secondary | ICD-10-CM | POA: Diagnosis not present

## 2023-08-02 DIAGNOSIS — R531 Weakness: Secondary | ICD-10-CM | POA: Insufficient documentation

## 2023-08-02 DIAGNOSIS — N189 Chronic kidney disease, unspecified: Secondary | ICD-10-CM | POA: Diagnosis not present

## 2023-08-02 DIAGNOSIS — R29898 Other symptoms and signs involving the musculoskeletal system: Secondary | ICD-10-CM

## 2023-08-02 DIAGNOSIS — M545 Low back pain, unspecified: Secondary | ICD-10-CM | POA: Diagnosis present

## 2023-08-02 LAB — CBC
HCT: 43.7 % (ref 36.0–46.0)
Hemoglobin: 13.8 g/dL (ref 12.0–15.0)
MCH: 28.2 pg (ref 26.0–34.0)
MCHC: 31.6 g/dL (ref 30.0–36.0)
MCV: 89.4 fL (ref 80.0–100.0)
Platelets: 478 10*3/uL — ABNORMAL HIGH (ref 150–400)
RBC: 4.89 MIL/uL (ref 3.87–5.11)
RDW: 13.6 % (ref 11.5–15.5)
WBC: 10.3 10*3/uL (ref 4.0–10.5)
nRBC: 0 % (ref 0.0–0.2)

## 2023-08-02 LAB — BASIC METABOLIC PANEL
Anion gap: 10 (ref 5–15)
BUN: 8 mg/dL (ref 6–20)
CO2: 28 mmol/L (ref 22–32)
Calcium: 9.2 mg/dL (ref 8.9–10.3)
Chloride: 102 mmol/L (ref 98–111)
Creatinine, Ser: 0.8 mg/dL (ref 0.44–1.00)
GFR, Estimated: >60 mL/min (ref >60)
Glucose, Bld: 88 mg/dL (ref 70–99)
Potassium: 3.9 mmol/L (ref 3.5–5.1)
Sodium: 140 mmol/L (ref 135–145)

## 2023-08-02 LAB — URINALYSIS, ROUTINE W REFLEX MICROSCOPIC
Bilirubin Urine: NEGATIVE
Glucose, UA: NEGATIVE mg/dL
Ketones, ur: NEGATIVE mg/dL
Leukocytes,Ua: NEGATIVE
Nitrite: NEGATIVE
Protein, ur: 100 mg/dL — AB
RBC / HPF: >50 RBC/hpf (ref 0–5)
Specific Gravity, Urine: 1.034 — ABNORMAL HIGH (ref 1.005–1.030)
pH: 6 (ref 5.0–8.0)

## 2023-08-02 MED ORDER — LIDOCAINE 5 % EX PTCH: 1.0000 | MEDICATED_PATCH | CUTANEOUS | 0 refills | Status: AC

## 2023-08-02 MED ORDER — HYDROCODONE-ACETAMINOPHEN 5-325 MG PO TABS: 1.0000 | ORAL_TABLET | Freq: Four times a day (QID) | ORAL | 0 refills | Status: AC | PRN

## 2023-08-02 MED ORDER — HYDROMORPHONE HCL 1 MG/ML IJ SOLN
0.5000 mg | Freq: Once | INTRAMUSCULAR | Status: AC
  Administered 2023-08-02: 0.5 mg via INTRAVENOUS
  Filled 2023-08-02: qty 1

## 2023-08-02 MED ORDER — FENTANYL CITRATE PF 50 MCG/ML IJ SOSY: 50.0000 ug | PREFILLED_SYRINGE | Freq: Once | INTRAMUSCULAR | Status: DC

## 2023-08-02 MED ORDER — CYCLOBENZAPRINE HCL 10 MG PO TABS: 10.0000 mg | ORAL_TABLET | Freq: Two times a day (BID) | ORAL | 0 refills | Status: AC | PRN

## 2023-08-02 MED ORDER — FENTANYL CITRATE PF 50 MCG/ML IJ SOSY
50.0000 ug | PREFILLED_SYRINGE | Freq: Once | INTRAMUSCULAR | Status: AC
  Administered 2023-08-02: 50 ug via INTRAVENOUS
  Filled 2023-08-02: qty 1

## 2023-08-02 MED ORDER — LIDOCAINE 5 % EX PTCH
1.0000 | MEDICATED_PATCH | CUTANEOUS | Status: DC
  Administered 2023-08-02: 1 via TRANSDERMAL
  Filled 2023-08-02: qty 1

## 2023-08-02 MED ORDER — GADOBUTROL 1 MMOL/ML IV SOLN
10.0000 mL | Freq: Once | INTRAVENOUS | Status: AC | PRN
  Administered 2023-08-02: 10 mL via INTRAVENOUS

## 2023-08-02 MED ORDER — ACETAMINOPHEN 500 MG PO TABS
1000.0000 mg | ORAL_TABLET | Freq: Once | ORAL | Status: AC
Start: 2023-08-02 — End: 2023-08-02
  Administered 2023-08-02: 1000 mg via ORAL
  Filled 2023-08-02: qty 2

## 2023-08-02 NOTE — ED Provider Notes (Signed)
Patient care assumed from previous provider.   Patient care of Shannon Spears is a 42 y.o. female from previous provider. Please see the original provider note from this emergency department encounter for full history and physical.   Course of Care and my assessment at the time of sign out is detailed in the ED Course below.   Clinical Course as of 08/02/23 2024  Sat Aug 02, 2023  1456 Stress fracture right hip back in April, had surgery. Recent November pain in right leg, dvt study negative. Day before christmas, wore cowboy boots, weakness in right leg, can't bear weight on it. Pain / vs no pain. Can't see spine doctor since end of January.   Can't bear weight on leg. Weakness on right leg.   Chronic pain, fibromyalgia, bipolar [JL]    Clinical Course User Index [JL] Gunnar Bulla, MD    This patient was seen here essentially for acute on chronic pain, right back shooting down the right leg.  I was asked to follow-up the results of her MRI  1. Mild broad-based disc protrusion at L3-4 is asymmetric to the  left.  2. Mild facet hypertrophy and ligamentum flavum thickening at L3-4  and L4-5 without focal stenosis.  3. Mild disc bulging at L5-S1 without focal stenosis.  4. No acute or focal lesion to explain the patient's symptoms.    \      She does not have any emergent reason of her pain, she is able to urinate freely.  Patient has an upcoming appoint with pain specialist, we will give her a very short course of Norco, she will take Norco Tylenol she has muscle relaxers at home, I will also give her a prescription for Flexeril she can try that if she does not want to try the Robaxin.  Also given prescription for lidocaine patches as well.  We gave her some pain medication here, she is feeling much better, and on my independent exam, she does not really have any focal weakness no right lower extremity weakness, it is more so pain in her low back when she tries to move it.  Will  discharge patient at this time   Gunnar Bulla, MD 08/02/23 2024    Charlynne Pander, MD 08/04/23 [phone]

## 2023-08-02 NOTE — Discharge Instructions (Addendum)
You have been seen here in the emergency department for pain management. We have obtained a full history, performed a physical exam, in addition to other diagnostic tests and treatments. Right now, we feel that you are safe for discharge from a medical perspective, and do not have an acute life threatening illness.   To do: 1.) Take all medications as prescribed.   2.) If anything changes, or you develop fevers, chills, inability to eat or drink, severe pain, new symptoms, return of symptoms, worsening of symptoms, or any other concerns, please call 911 or come back to the emergency department as soon as possible.   3.) Please make an appointment with your primary care doctor for a follow-up visit after being seen here in the emergency department.   Thank you for allowing me to take care of you today. We hope that you feel better soon.  4.) You can try flexeril, IN REPLACEMENT of the robaxin. Also prescribed lidocaine patches.   1. Mild broad-based disc protrusion at L3-4 is asymmetric to the  left.  2. Mild facet hypertrophy and ligamentum flavum thickening at L3-4  and L4-5 without focal stenosis.  3. Mild disc bulging at L5-S1 without focal stenosis.  4. No acute or focal lesion to explain the patient's symptoms.

## 2023-08-02 NOTE — ED Notes (Signed)
Pt in contact with family for safe discharge home.

## 2023-08-02 NOTE — ED Triage Notes (Signed)
Reports had hip surgery in April then fell about 2-3 months ago.  Reports can't seen neurosurgeon until end of jan and was fine on Wednesday but then work up Thursday with inability to bear weight on right leg without excrociating pain.  Patient has papers for UC.  Reports he MD referred her to [facility] but her insurance is not in network there.

## 2023-08-02 NOTE — ED Provider Notes (Signed)
[place] EMERGENCY DEPARTMENT AT [facility] Provider Note   CSN: [id number] Arrival date & time: 08/02/23  1039     History  Chief Complaint  Patient presents with   Back Pain    Shannon Spears is a 42 y.o. female with medical history of CKD, bipolar 1 disorder, depression, fibromyalgia, status post right hip cannulated pinning for femoral neck stress fracture on/19/24.  Patient presents to ED complaining of weakness in her right lower extremity.  States that she has had no issues with her right lower leg since having surgery in April.  On chart review, it appears the patient did complain of right lower leg pain today visit to her orthopedic doctor on 06/13/2023.  She presents today stating that the day after Christmas she were cowboy boots instead of her usual tennis shoes and the next morning woke up with an inability to bear weight on her right lower leg.  She states that there is pain going to the right side of her back down into her leg.  She denies any preceding event to account for this pain or decreased strength.  She denies bowel or bladder incontinence, groin numbness.  She is endorsing weakness to her right lower extremity.  Denies numbness or tingling.  Denies fevers at home, nausea or vomiting.   Back Pain Associated symptoms: weakness        Home Medications Prior to Admission medications   Medication Sig Start Date End Date Taking? Authorizing Provider  albuterol (VENTOLIN HFA) 108 (90 Base) MCG/ACT inhaler Inhale 2 puffs into the lungs every 6 (six) hours as needed for wheezing or shortness of breath. 03/07/23   Monna Fam, MD  Albuterol-Budesonide [facility]) 90-80 MCG/ACT AERO Inhale 2 puffs into the lungs every 4 (four) hours as needed. 04/30/23   Nyoka Cowden, MD  azelastine (ASTELIN) 0.1 % nasal spray Place 1 spray into both nostrils 2 (two) times daily. 02/26/23   [provider]  CALCIUM-VITAMIN D PO Take 1 tablet by mouth daily.    [provider]  cyclobenzaprine (FLEXERIL) 10 MG tablet Take 1 tablet (10 mg total) by mouth 3 (three) times daily as needed for muscle spasms. 06/21/19   Hoy Register, MD  diazepam (VALIUM) 10 MG tablet Take 1 tablet (10 mg total) by mouth 3 (three) times daily as needed for anxiety. 06/24/23   Mozingo, Thereasa Solo, NP  famotidine (PEPCID AC MAXIMUM STRENGTH) 20 MG tablet Take 20 mg by mouth 2 (two) times daily.    [provider]  fluticasone-salmeterol (ADVAIR) 100-50 MCG/ACT AEPB Inhale into the lungs. 04/25/23   [provider]  hydrOXYzine (ATARAX) 25 MG tablet TAKE 1 TABLET BY MOUTH UP TO 6 TIMES A DAY AS NEEDED FOR ANXIETY, ITCHING & SLEEP 06/19/23   Cottle, Steva Ready., MD  ipratropium (ATROVENT) 0.06 % nasal spray Place into the nose. 03/20/23 03/19/24  [provider]  Lactobacillus (PROBIOTIC ACIDOPHILUS PO) Take 1 capsule by mouth daily.    [provider]  lamoTRIgine (LAMICTAL) 200 MG tablet Take 1 tablet (200 mg total) by mouth at bedtime. 06/24/23   Mozingo, Thereasa Solo, NP  levETIRAcetam (KEPPRA) 500 MG tablet Take 1,000 mg by mouth 2 (two) times daily.    [provider]  levothyroxine (SYNTHROID) 25 MCG tablet Take 25 mcg by mouth daily.    [provider]  methocarbamol (ROBAXIN-750) 750 MG tablet Take 1 tablet (750 mg total) by mouth 2 (two) times daily as needed  for muscle spasms. 02/24/23   Cristie Hem, PA-C  metoprolol succinate (TOPROL-XL) 25 MG 24 hr tablet Take 37.5 mg by mouth at bedtime. 01/15/23   [provider]  montelukast (SINGULAIR) 10 MG tablet Take 10 mg by mouth daily.    [provider]  nystatin (MYCOSTATIN) 100000 UNIT/ML suspension Take by mouth. 04/16/23   [provider]  ondansetron (ZOFRAN-ODT) 4 MG disintegrating tablet Take 1 tablet (4 mg total) by mouth every 8 (eight) hours as needed for nausea or vomiting. 01/03/23   Gailen Shelter, PA  pantoprazole  (PROTONIX) 40 MG tablet Take 1 tablet (40 mg total) by mouth daily. Take 30-60 min before first meal of the day 04/30/23   Nyoka Cowden, MD  Vitamin D, Ergocalciferol, 50000 units CAPS Take 1 capsule by mouth once a week. Thursday    [provider]      Allergies    Buprenorphine, Penicillins, Gabapentin, Nsaids, Propoxyphene, Tetanus toxoids, Adhesive [tape], Codeine, Ibuprofen, and Doxycycline    Review of Systems   Review of Systems  Musculoskeletal:  Positive for back pain.  Neurological:  Positive for weakness.  All other systems reviewed and are negative.   Physical Exam Updated Vital Signs BP (!) 111/90 (BP Location: Left Arm)   Pulse 91   Temp 98.1 F (36.7 C) (Oral)   Resp 15   Ht 5\' 1"  (1.549 m)   Wt 87.5 kg   LMP 10/02/2012   SpO2 100%   BMI 36.47 kg/m  Physical Exam Vitals and nursing note reviewed.  Constitutional:      General: She is not in acute distress.    Appearance: Normal appearance. She is not ill-appearing, toxic-appearing or diaphoretic.  HENT:     Head: Normocephalic and atraumatic.     Nose: Nose normal.     Mouth/Throat:     Mouth: Mucous membranes are moist.     Pharynx: Oropharynx is clear.  Eyes:     Extraocular Movements: Extraocular movements intact.     Conjunctiva/sclera: Conjunctivae normal.     Pupils: Pupils are equal, round, and reactive to light.  Cardiovascular:     Rate and Rhythm: Normal rate and regular rhythm.  Pulmonary:     Effort: Pulmonary effort is normal.     Breath sounds: Normal breath sounds. No wheezing.  Abdominal:     General: Abdomen is flat. Bowel sounds are normal.     Palpations: Abdomen is soft.     Tenderness: There is no abdominal tenderness.  Musculoskeletal:     Cervical back: Normal range of motion and neck supple. No tenderness.       Back:  Skin:    General: Skin is warm and dry.     Capillary Refill: Capillary refill takes less than 2 seconds.  Neurological:     Mental Status:  She is alert and oriented to person, place, and time.     GCS: GCS eye subscore is 4. GCS verbal subscore is 5. GCS motor subscore is 6.     Cranial Nerves: Cranial nerves 2-12 are intact. No cranial nerve deficit.     Sensory: Sensation is intact. No sensory deficit.     Comments: 1 out of 5 strength right lower extremity.  5 out of 5 strength left lower extremity.     ED Results / Procedures / Treatments   Labs (all labs ordered are listed, but only abnormal results are displayed) Labs Reviewed  CBC  BASIC METABOLIC PANEL  URINALYSIS, ROUTINE W REFLEX MICROSCOPIC    EKG None  Radiology No results found.  Procedures Procedures   Medications Ordered in ED Medications  fentaNYL (SUBLIMAZE) injection 50 mcg (has no administration in time range)    ED Course/ Medical Decision Making/ A&P Clinical Course as of 08/02/23 1522  Sat Aug 02, 2023  1456 Stress fracture right hip back in April, had surgery. Recent November pain in right leg, dvt study negative. Day before christmas, wore cowboy boots, weakness in right leg, can't bear weight on it. Pain / vs no pain. Can't see spine doctor since end of January.   Can't bear weight on leg. Weakness on right leg.   Chronic pain, fibromyalgia, bipolar [JL]    Clinical Course User Index [JL] Gunnar Bulla, MD    Medical Decision Making Amount and/or Complexity of Data Reviewed Labs: ordered. Radiology: ordered.  Risk Prescription drug management.   42 year old female presents to ED for evaluation.  Please see HPI for further details.  On my examination the patient is afebrile and nontachycardic.  Her lung sounds are clear bilaterally, she is not hypoxic.  Abdomen soft and compressible throughout.  Neurological examination reveals 1 out of 5 strength right lower extremity, 5 out of 5 strength left lower extremity.  She has tenderness to the right side of her low back.  No centralized lumbar spinal tenderness.  Overall nontoxic  in appearance.  Patient states that she is unable to see her orthopedic doctor/spine specialist until the end of January.  Will collect labs, MRI of her lumbar spine.  At end my shift labs are pending, the patient MRI is pending.  Patient was provided 50 mcg fentanyl for pain but has not yet been administered.  Signed out to oncoming resident.  Plan of management discussed.   Final Clinical Impression(s) / ED Diagnoses Final diagnoses:  Right leg weakness  Low back pain, unspecified back pain laterality, unspecified chronicity, unspecified whether sciatica present    Rx / DC Orders ED Discharge Orders     None         Al Decant, PA-C 08/02/23 1522

## 2023-08-02 NOTE — ED Provider Triage Note (Signed)
Emergency Medicine Provider Triage Evaluation Note  Shannon Spears , a 42 y.o. female  was evaluated in triage.  Pt complains of back pain.  Review of Systems  Positive:  Negative:   Physical Exam  BP (!) 111/90 (BP Location: Left Arm)   Pulse 91   Temp 98.1 F (36.7 C) (Oral)   Resp 15   Ht 5\' 1"  (1.549 m)   Wt 87.5 kg   LMP 10/02/2012   SpO2 100%   BMI 36.47 kg/m  Gen:   Awake, no distress   Resp:  Normal effort  MSK:   Moves extremities without difficulty  Other:    Medical Decision Making  Medically screening exam initiated at 11:07 AM.  Appropriate orders placed.  Shannon Spears was informed that the remainder of the evaluation will be completed by another provider, this initial triage assessment does not replace that evaluation, and the importance of remaining in the ED until their evaluation is complete.  Back pain radiating down right leg. Is seeing a back pain specialist. No recent falls/trauma or lifting heavy objects. Chronic back pain has been increasing since Thursday. States that she cannot take steroids. Had hip surgery in April.  Denies fever, chest pain, dyspnea, cough, nausea, vomiting, diarrhea, dysuria, hematochezia. Patient denies urinary retention, fecal incontinence, saddle anesthesia, lower extremity weakness, hx of cancer, immunosuppression, IVDU, spinal procedure, significant trauma.   Dorthy Cooler, [place] 08/02/23 1110

## 2023-08-02 NOTE — ED Notes (Signed)
Pt states parents en route to pick up pt. Deny any needs at this time.

## 2023-08-02 NOTE — ED Notes (Signed)
Patient transported to MRI 

## 2023-08-04 ENCOUNTER — Other Ambulatory Visit: Payer: Self-pay

## 2023-08-05 ENCOUNTER — Encounter: Payer: Self-pay | Admitting: Physical Medicine and Rehabilitation

## 2023-08-05 ENCOUNTER — Ambulatory Visit (INDEPENDENT_AMBULATORY_CARE_PROVIDER_SITE_OTHER): Payer: Commercial Managed Care - HMO | Admitting: Physical Medicine and Rehabilitation

## 2023-08-05 VITALS — BP 131/92 | HR 99

## 2023-08-05 DIAGNOSIS — M5441 Lumbago with sciatica, right side: Secondary | ICD-10-CM

## 2023-08-05 DIAGNOSIS — S39012A Strain of muscle, fascia and tendon of lower back, initial encounter: Secondary | ICD-10-CM

## 2023-08-05 DIAGNOSIS — M79604 Pain in right leg: Secondary | ICD-10-CM

## 2023-08-05 DIAGNOSIS — M797 Fibromyalgia: Secondary | ICD-10-CM

## 2023-08-05 DIAGNOSIS — G894 Chronic pain syndrome: Secondary | ICD-10-CM

## 2023-08-05 NOTE — Progress Notes (Signed)
 Shannon Spears - 42 y.o. female MRN [id number]  Date of birth: [birth year]  Office Visit Note: Visit Date: 08/05/2023 PCP: Nicholaus Aleck LABOR, PA-C Referred by: Nicholaus Aleck LABOR, PA-C  Subjective: Chief Complaint  Patient presents with   Lower Back - Pain   Right Leg - Pain   HPI: Shannon Spears is a 42 y.o. female who comes in today as an ED follow up for evaluation of acute right sided lower back pain radiating to right hip, buttock and down lateral leg to foot. Patient states she wore new cowboy boots on Christmas day, pain started shortly after on 07/31/2023. States she went to stand up from sitting position and was unable to bear weight on right leg. Her pain worsens with movement and activity, describes as sore and aching sensation, currently rates as 10 out of 10. She has tried home exercise regimen, rest and use of medications with minimal relief of pain. She is currently taking Tramadol  prescribed by PCP. She was evaluated in the emergency department on 08/02/2023, recent lumbar MRI imaging shows mild facet hypertrophy and ligamentum flavum thickening at L3-L4 and L4-L5 without focal stenosis. Mild disc bulging at L5-S1. No high grade spinal canal stenosis noted. She was discharged from ED with medications and instructed to follow up. She continues to experience right leg weakness, difficulty walking and bearing weight on right leg. History of right hip pinning with Dr. Ozell Cummins on 11/22/2022. No history of lumbar surgery/injections. Patient denies recent trauma or falls. No bowel or bladder incontinence.   Of note, she does carry diagnosis of fibromyalgia, anxiety, bipolar disorder and tobacco abuse. Multiple allergies/intolerances to medications noted. She does have pending referral to chronic pain management with [facility] on 09/04/2023.       Review of Systems  Musculoskeletal:  Positive for back pain and myalgias.  Neurological:  Positive for focal weakness. Negative for tingling  and sensory change.  All other systems reviewed and are negative.  Otherwise per HPI.  Assessment & Plan: Visit Diagnoses:    ICD-10-CM   1. Acute myofascial strain of lumbar region, initial encounter  S39.012A     2. Acute right-sided low back pain with right-sided sciatica  M54.41     3. Pain in right leg  M79.604     4. Chronic pain syndrome  G89.4     5. Fibromyalgia  M79.7        Plan: Findings:  Acute right sided lower back pain radiating to right hip, buttock and down entire leg to foot. Patient continues to have severe pain despite good conservative therapies such as home exercise regimen, rest and use of medications. Patients clinical presentation and exam are complex, her pain does not follow specific dermatomal pattern. Her right leg is extremely tender to palpation. I do feel her pain is more consistent with lumbar myofascial strain. I also feel her fibromyalgia is working to exacerbate her symptoms. Her exam today was difficult due to pain and decreased effort. She has good strength to bilateral lower extremities with distraction. She was able to walk into exam room without difficulty. Recent lumbar MRI imaging does not directly correlate with her symptoms, no severe nerve impingement, no spinal canal stenosis noted. We discussed treatment options today, she is not interested in injections at this time. At this point, would not recommend performing interventional spine procedures as recent MRI imaging looks fairly normal. Would recommend patient follow up with chronic pain provider, can speak with her primary care provider  regarding pain management until she is able to be seen at [facility]. I do feel patient would benefit from long term medication management, could also look at re-grouping with short course of formal physical therapy with a focus on manual treatments and core strengthening. No red flag symptoms noted upon exam today.     Meds & Orders: No orders of the defined  types were placed in this encounter.  No orders of the defined types were placed in this encounter.   Follow-up: Return if symptoms worsen or fail to improve.   Procedures: No procedures performed      Clinical History: CLINICAL DATA:  Low back pain, cauda equinus syndrome suspected.   EXAM: MRI LUMBAR SPINE WITHOUT AND WITH CONTRAST   TECHNIQUE: Multiplanar and multiecho pulse sequences of the lumbar spine were obtained without and with intravenous contrast.   CONTRAST:  10mL GADAVIST  GADOBUTROL  1 MMOL/ML IV SOLN   COMPARISON:  MRI lumbar spine 07/27/2022   FINDINGS: Segmentation: 5 non rib-bearing lumbar type vertebral bodies are present. The lowest fully formed vertebral body is L5.   Alignment: No significant listhesis is present. Lumbar lordosis is stable.   Vertebrae:  Marrow signal and vertebral body heights are normal.   Conus medullaris and cauda equina: Conus extends to the L1-2 level. Conus and cauda equina appear normal.   Paraspinal and other soft tissues: A simple cyst at the upper pole of the right kidney measures 13 mm. No follow-up imaging is recommended. Visualized abdomen is otherwise unremarkable. No significant adenopathy is present.   Disc levels:   T12-L1: Normal disc signal and height is present. No focal protrusion or stenosis is present.   L1-2: Normal disc signal and height is present. No focal protrusion or stenosis is present.   L2-3: Normal disc signal and height is present. No focal protrusion or stenosis is present.   L3-4: A mild broad-based disc protrusion is asymmetric to the left. Mild facet hypertrophy and ligamentum flavum thickening is present. No focal stenosis is present.   L4-5: A mild broad-based disc protrusion is present. Mild facet hypertrophy is present. No focal stenosis is present.   L5-S1: Mild disc bulging is present without focal stenosis.   IMPRESSION: 1. Mild broad-based disc protrusion at L3-4 is  asymmetric to the left. 2. Mild facet hypertrophy and ligamentum flavum thickening at L3-4 and L4-5 without focal stenosis. 3. Mild disc bulging at L5-S1 without focal stenosis. 4. No acute or focal lesion to explain the patient's symptoms.     Electronically Signed   By: Lonni Necessary M.D.   On: 08/02/2023 18:06   She reports that she has been smoking cigarettes. She has never used smokeless tobacco. No results for input(s): HGBA1C, LABURIC in the last 8760 hours.  Objective:  VS:  HT:    WT:   BMI:     BP:(!) 131/92  HR:99bpm  TEMP: ( )  RESP:  Physical Exam Vitals and nursing note reviewed.  HENT:     Head: Normocephalic and atraumatic.     Right Ear: External ear normal.     Left Ear: External ear normal.     Nose: Nose normal.     Mouth/Throat:     Mouth: Mucous membranes are moist.  Eyes:     Extraocular Movements: Extraocular movements intact.  Cardiovascular:     Rate and Rhythm: Normal rate.     Pulses: Normal pulses.  Pulmonary:     Effort: Pulmonary effort is normal.  Abdominal:     General: Abdomen is flat. There is no distension.  Musculoskeletal:        General: Tenderness present.     Cervical back: Normal range of motion.     Comments: Patient rises from seated position to standing without difficulty. Exam difficult due to pain and decreased effort. Good lumbar range of motion. No pain noted with facet loading. 5/5 strength noted upon distraction with bilateral hip flexion, knee flexion/extension, ankle dorsiflexion/plantarflexion and EHL. No clonus noted bilaterally. No pain upon palpation of greater trochanters. No pain with internal/external rotation of bilateral hips. Sensation intact bilaterally. Allodynia like symptoms noted upon palpation of right leg. Negative slump test bilaterally. Antalgia gait noted. She is reluctant to bear weight on right leg due to severe pain.    Skin:    General: Skin is warm and dry.     Capillary Refill:  Capillary refill takes less than 2 seconds.  Neurological:     Mental Status: She is alert and oriented to person, place, and time.     Gait: Gait abnormal.  Psychiatric:        Mood and Affect: Mood normal.        Behavior: Behavior normal.     Ortho Exam  Imaging: No results found.  Past Medical/Family/Surgical/Social History: Medications & Allergies reviewed per EMR, new medications updated. Patient Active Problem List   Diagnosis Date Noted   Cough variant asthma vs UACS with VCD 04/30/2023   Acute hypoxic respiratory failure (HCC) 03/06/2023   CAP (community acquired pneumonia) 01/18/2023   Sepsis (HCC) 01/18/2023   Atypical chest pain 01/18/2023   Sinus tachycardia 01/18/2023   GAD (generalized anxiety disorder) 01/18/2023   GERD (gastroesophageal reflux disease) 01/18/2023   Mild intermittent asthma 01/18/2023   Essential hypertension 01/18/2023   Fracture, stress, femur, neck, initial encounter 11/22/2022   Vitamin D deficiency 11/28/2020   Osteopenia 11/08/2020   Knee contusion 04/06/2019   Bipolar 1 disorder (HCC) 06/16/2018   Chronic post-traumatic stress disorder 06/16/2018   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   Opioid use disorder, mild, in sustained remission (HCC) 06/16/2018   S/P cholecystectomy 02/10/2018   Chronic recurrent pancreatitis (HCC) 01/06/2018   S/P cervical spinal fusion 12/08/2017   Protrusion of cervical intervertebral disc 12/08/2017   Anxiety 07/10/2017   Chronic vulvitis 07/10/2017   Irritable bowel syndrome 07/10/2017   Methicillin resistant Staphylococcus aureus infection 07/10/2017   Migraine 07/10/2017   Constipation 06/16/2017   Overweight (BMI 25.0-29.9) 11/11/2016   Acute respiratory insufficiency 06/21/2016   Asthmatic bronchitis with acute exacerbation 06/21/2016   Tobacco abuse disorder 06/21/2016   Fibromyalgia 10/09/2015   Hypercholesterolemia 10/09/2015   Thrombocytosis 09/13/2015   HNP (herniated nucleus pulposus),  cervical 10/02/2012    Class: Diagnosis of   Past Medical History:  Diagnosis Date   Anxiety    Arthritis    Bipolar 1 disorder, manic, mild (HCC)    CKD (chronic kidney disease)    Depression    Fibromyalgia    GERD (gastroesophageal reflux disease)    Headache(784.0)    Palpitations    Family History  Problem Relation Age of Onset   Bipolar disorder Mother    Diabetes Mother    Hypertension Mother    Hypertension Father    Colon cancer Paternal Grandmother    Past Surgical History:  Procedure Laterality Date   ANTERIOR CERVICAL DECOMP/DISCECTOMY FUSION N/A 10/02/2012   Procedure: C4-5, C5-6 anterior cervical discectomy and fusion, allograft plate;  Surgeon: Oneil  JAYSON Herald, MD;  Location: [facility] OR;  Service: Orthopedics;  Laterality: N/A;   CHOLECYSTECTOMY     CYSTOGRAPHY     HIP PINNING,CANNULATED Right 11/22/2022   Procedure: RIGHT HIP PINNING;  Surgeon: Jerri Kay HERO, MD;  Location: [facility] OR;  Service: Orthopedics;  Laterality: Right;   LAPAROSCOPIC TOTAL HYSTERECTOMY     OOPHORECTOMY     TUBAL LIGATION     Social History   Occupational History   Not on file  Tobacco Use   Smoking status: Some Days    Current packs/day: 0.50    Types: Cigarettes   Smokeless tobacco: Never  Vaping Use   Vaping status: Never Used  Substance and Sexual Activity   Alcohol use: Yes    Comment: rarely   Drug use: Yes    Types: Marijuana    Comment: occasionally   Sexual activity: Not on file

## 2023-08-05 NOTE — Progress Notes (Signed)
 Functional Pain Scale - descriptive words and definitions  Distracting (5)    Aware of pain/able to complete some ADL's but limited by pain/sleep is affected and active distractions are only slightly useful. Moderate range order  Average Pain 5  New patient, seen in ED recently for LBP and rt leg weakness after wearing cowboy boots. Started on Christmas Day. Tried a short course of pain meds.

## 2023-08-13 ENCOUNTER — Other Ambulatory Visit: Payer: Self-pay | Admitting: Psychiatry

## 2023-08-13 DIAGNOSIS — F4312 Post-traumatic stress disorder, chronic: Secondary | ICD-10-CM

## 2023-08-13 DIAGNOSIS — F451 Undifferentiated somatoform disorder: Secondary | ICD-10-CM

## 2023-09-02 ENCOUNTER — Telehealth: Payer: Self-pay | Admitting: Adult Health

## 2023-09-02 NOTE — Telephone Encounter (Signed)
Pt advised of Gina's med recommendation. She is not interested . She did say she'd like to increase diazepam but isn't going to ask for that. And that she will see Almira Coaster at her appt to talk more and hopefully by then she will feel better after the shock goes away.

## 2023-09-02 NOTE — Telephone Encounter (Signed)
Emmali was told that her paternal grandmother 51, is seeing people who aren't there hearing things, being combative, losing a lot of weight she says she's at the end stage of life. She says that it came all of a sudden. She is dealing with Fibromyalgia and she is seeing pain management, they currently have her on Hydrocodone 5 mg and a muscle relaxer Zanaflex . She reports that she been told by multiple doctors that the injection shots could cause osteoporosis. She says that she is having a hard time with everything that's going on. She said its difficult because this is the grandmother she took care of before her hip sx. She says that she is not prepared for when that day comes. Her daughter has dance competitions coming up. She is unsure of how to explain this to her daughter.  Her anxiety is through the roof and very stressed out.  She is unsure what to with her grief. She reports that her sleep is disrupted. She says sometimes its easy and sometimes its hard to fall back asleep. All she wants to do is sleep. She feels very guilty because she can't take care of her anymore dt her own health issues. Appetite seems to be fine but she thinks that's due to her thyroid medication, if it wasn't for that she wouldn't eat, she eats a little bit at a time.   Currently taking  as of 11/19  Lamictal 200mg  daily 2. Valium 10mg  TID  3. Atarax 25mg  up to 6 times daily     [place] 2/19

## 2023-09-02 NOTE — Telephone Encounter (Signed)
Patient called in stating that she is having some family issues and would like rtc from RM to discuss recommedations on what she should do. Appt 2/19 Ph: [phone]

## 2023-09-10 ENCOUNTER — Other Ambulatory Visit: Payer: Self-pay

## 2023-09-10 ENCOUNTER — Other Ambulatory Visit: Payer: Self-pay | Admitting: Internal Medicine

## 2023-09-23 ENCOUNTER — Telehealth: Payer: Self-pay | Admitting: Adult Health

## 2023-09-23 ENCOUNTER — Other Ambulatory Visit: Payer: Self-pay

## 2023-09-23 DIAGNOSIS — F4312 Post-traumatic stress disorder, chronic: Secondary | ICD-10-CM

## 2023-09-23 DIAGNOSIS — F451 Undifferentiated somatoform disorder: Secondary | ICD-10-CM

## 2023-09-23 MED ORDER — HYDROXYZINE HCL 25 MG PO TABS: ORAL_TABLET | ORAL | 0 refills | Status: DC

## 2023-09-23 NOTE — Telephone Encounter (Signed)
Sent to [facility].

## 2023-09-23 NOTE — Telephone Encounter (Signed)
PT called and needs a refill on her hydroxyzine 25 mg. She is completely out. She has an appt tomorrow

## 2023-09-24 ENCOUNTER — Telehealth: Payer: Commercial Managed Care - HMO | Admitting: Adult Health

## 2023-09-24 ENCOUNTER — Encounter: Payer: Self-pay | Admitting: Adult Health

## 2023-09-24 DIAGNOSIS — F4312 Post-traumatic stress disorder, chronic: Secondary | ICD-10-CM

## 2023-09-24 DIAGNOSIS — F411 Generalized anxiety disorder: Secondary | ICD-10-CM | POA: Diagnosis not present

## 2023-09-24 DIAGNOSIS — F451 Undifferentiated somatoform disorder: Secondary | ICD-10-CM | POA: Diagnosis not present

## 2023-09-24 DIAGNOSIS — F3175 Bipolar disorder, in partial remission, most recent episode depressed: Secondary | ICD-10-CM | POA: Diagnosis not present

## 2023-09-24 MED ORDER — DIAZEPAM 10 MG PO TABS: 10.0000 mg | ORAL_TABLET | Freq: Three times a day (TID) | ORAL | 2 refills | Status: DC | PRN

## 2023-09-24 NOTE — Progress Notes (Signed)
Shannon Spears [id number] 1981/05/20 43 y.o.  Virtual Visit via Video Note  I connected with pt @ on 09/24/23 at 11:30 AM EST by a video enabled telemedicine application and verified that I am speaking with the correct person using two identifiers.   I discussed the limitations of evaluation and management by telemedicine and the availability of in person appointments. The patient expressed understanding and agreed to proceed.  I discussed the assessment and treatment plan with the patient. The patient was provided an opportunity to ask questions and all were answered. The patient agreed with the plan and demonstrated an understanding of the instructions.   The patient was advised to call back or seek an in-person evaluation if the symptoms worsen or if the condition fails to improve as anticipated.  I provided 25 minutes of non-face-to-face time during this encounter.  The patient was located at home.  The provider was located at [facility].   Dorothyann Gibbs, NP   Subjective:   Patient ID:  Shannon Spears is a 43 y.o. (DOB Feb 06, 1981) female.  Chief Complaint: No chief complaint on file.   HPI Torrey Horseman presents for follow-up of BPD-1, PTSD, anxiety, and somatic symptom disorder.  Describes mood today as "ok". Pleasant. Reports tearful at times - "a lot of pain". Mood symptoms - denies depression, but doesn't feel like herself. Reports varying interest and motivation. Reports anxiety and irritability. Reports decreased panic attacks. Reports worry, rumination, and over thinking. Reports ongoing situational stressors. Reports health issues - started with pain management - [facility]. Reports mood is variable. Stating "I feel like I'm doing a little better". Taking medications as prescribed. Energy levels lower. Active, does not have a regular exercise routine with physical disabilities. Enjoys some usual interests and activities. Married. Lives with husband and daughter.  Spending time with family. Reports appetite adequate. Reports weight gain - 200 pounds. Sleeps better some nights than others - depends on pain levels.   Focus and concentration difficulties. Completing tasks. Managing aspects of household. Stay at home mom. Denies SI or HI.  Denies AH or VH. Denies self harm.  Denies substance use.  Followed by pain management - [facility].  Previous medication trials: Unknown  Review of Systems:  Review of Systems  Musculoskeletal:  Negative for gait problem.  Neurological:  Negative for tremors.  Psychiatric/Behavioral:         Please refer to HPI   Medications: I have reviewed the patient's current medications.  Current Outpatient Medications  Medication Sig Dispense Refill   albuterol (VENTOLIN HFA) 108 (90 Base) MCG/ACT inhaler Inhale 2 puffs into the lungs every 6 (six) hours as needed for wheezing or shortness of breath. 6.7 g 2   Albuterol-Budesonide (AIRSUPRA) 90-80 MCG/ACT AERO Inhale 2 puffs into the lungs every 4 (four) hours as needed. 10.7 g 11   azelastine (ASTELIN) 0.1 % nasal spray Place 1 spray into both nostrils 2 (two) times daily.     CALCIUM-VITAMIN D PO Take 1 tablet by mouth daily.     cyclobenzaprine (FLEXERIL) 10 MG tablet Take 1 tablet (10 mg total) by mouth 2 (two) times daily as needed for muscle spasms. 20 tablet 0   diazepam (VALIUM) 10 MG tablet Take 1 tablet (10 mg total) by mouth 3 (three) times daily as needed for anxiety. 90 tablet 2   famotidine (PEPCID AC MAXIMUM STRENGTH) 20 MG tablet Take 20 mg by mouth 2 (two) times daily.     fluticasone-salmeterol (ADVAIR) 100-50 MCG/ACT  AEPB Inhale into the lungs.     HYDROcodone-acetaminophen (NORCO/VICODIN) 5-325 MG tablet Take 1 tablet by mouth every 6 (six) hours as needed. 8 tablet 0   hydrOXYzine (ATARAX) 25 MG tablet TAKE 1 TABLET BY MOUTH UP TO 6 TIMES A DAY AS NEEDED FOR ANXIETY, ITCHING & SLEEP 180 tablet 0   ipratropium (ATROVENT) 0.06 % nasal spray Place  into the nose.     Lactobacillus (PROBIOTIC ACIDOPHILUS PO) Take 1 capsule by mouth daily.     lamoTRIgine (LAMICTAL) 200 MG tablet Take 1 tablet (200 mg total) by mouth at bedtime. 30 tablet 5   levETIRAcetam (KEPPRA) 500 MG tablet Take 1,000 mg by mouth 2 (two) times daily.     levothyroxine (SYNTHROID) 25 MCG tablet Take 25 mcg by mouth daily.     lidocaine (LIDODERM) 5 % Place 1 patch onto the skin daily. Remove & Discard patch within 12 hours or as directed by MD 30 patch 0   methocarbamol (ROBAXIN-750) 750 MG tablet Take 1 tablet (750 mg total) by mouth 2 (two) times daily as needed for muscle spasms. 20 tablet 2   metoprolol succinate (TOPROL-XL) 25 MG 24 hr tablet Take 37.5 mg by mouth at bedtime.     montelukast (SINGULAIR) 10 MG tablet Take 10 mg by mouth daily.     nystatin (MYCOSTATIN) 100000 UNIT/ML suspension Take by mouth.     ondansetron (ZOFRAN-ODT) 4 MG disintegrating tablet Take 1 tablet (4 mg total) by mouth every 8 (eight) hours as needed for nausea or vomiting. 20 tablet 0   pantoprazole (PROTONIX) 40 MG tablet Take 1 tablet (40 mg total) by mouth daily. Take 30-60 min before first meal of the day 30 tablet 2   traMADol (ULTRAM) 50 MG tablet Take 50 mg by mouth every 4 (four) hours as needed for moderate pain (pain score 4-6).     Vitamin D, Ergocalciferol, 50000 units CAPS Take 1 capsule by mouth once a week. Thursday     No current facility-administered medications for this visit.    Medication Side Effects: None  Allergies:  Allergies  Allergen Reactions   Buprenorphine Shortness Of Breath and Rash    Breathing problems   Penicillins Anaphylaxis, Nausea And Vomiting and Swelling    TOLERATES ANCEF   Gabapentin Nausea And Vomiting   Nsaids     Renal insufficiency   Propoxyphene Nausea Only    darvocet   Tetanus Toxoids     Blister on legs, MRSA   Adhesive [Tape] Dermatitis   Codeine     Mouth went numb   Ibuprofen     Has kidney disease.   Doxycycline  Rash    Past Medical History:  Diagnosis Date   Anxiety    Arthritis    Bipolar 1 disorder, manic, mild (HCC)    CKD (chronic kidney disease)    Depression    Fibromyalgia    GERD (gastroesophageal reflux disease)    Headache(784.0)    Palpitations     Family History  Problem Relation Age of Onset   Bipolar disorder Mother    Diabetes Mother    Hypertension Mother    Hypertension Father    Colon cancer Paternal Grandmother     Social History   Socioeconomic History   Marital status: Married    Spouse name: Not on file   Number of children: Not on file   Years of education: Not on file   Highest education level: Not on file  Occupational  History   Not on file  Tobacco Use   Smoking status: Some Days    Current packs/day: 0.50    Types: Cigarettes   Smokeless tobacco: Never  Vaping Use   Vaping status: Never Used  Substance and Sexual Activity   Alcohol use: Yes    Comment: rarely   Drug use: Yes    Types: Marijuana    Comment: occasionally   Sexual activity: Not on file  Other Topics Concern   Not on file  Social History Narrative   Not on file   Social Drivers of Health   Financial Resource Strain: Low Risk  (01/15/2023)   Received from [facility], [facility]   Overall Financial Resource Strain (CARDIA)    Difficulty of Paying Living Expenses: Not hard at all  Food Insecurity: No Food Insecurity (01/18/2023)   Hunger Vital Sign    Worried About Running Out of Food in the Last Year: Never true    Ran Out of Food in the Last Year: Never true  Transportation Needs: No Transportation Needs (01/18/2023)   PRAPARE - Administrator, Civil Service (Medical): No    Lack of Transportation (Non-Medical): No  Physical Activity: Sufficiently Active (09/24/2021)   Received from [facility], [facility]   Exercise Vital Sign    Days of Exercise per Week: 7 days    Minutes of Exercise per Session: 40 min  Stress: Stress Concern Present  (09/24/2021)   Received from [facility], [facility] - Occupational Stress Questionnaire    Feeling of Stress : Very much  Social Connections: Unknown (12/02/2021)   Received from [facility], [facility]   Social Network    Social Network: Not on file  Intimate Partner Violence: Not At Risk (01/18/2023)   Humiliation, Afraid, Rape, and Kick questionnaire    Fear of Current or Ex-Partner: No    Emotionally Abused: No    Physically Abused: No    Sexually Abused: No    Past Medical History, Surgical history, Social history, and Family history were reviewed and updated as appropriate.   Please see review of systems for further details on the patient's review from today.   Objective:   Physical Exam:  LMP 10/02/2012   Physical Exam Constitutional:      General: She is not in acute distress. Musculoskeletal:        General: No deformity.  Neurological:     Mental Status: She is alert and oriented to person, place, and time.     Coordination: Coordination normal.  Psychiatric:        Attention and Perception: Attention and perception normal. She does not perceive auditory or visual hallucinations.        Mood and Affect: Affect is not labile, blunt, angry or inappropriate.        Speech: Speech normal.        Behavior: Behavior normal.        Thought Content: Thought content normal. Thought content is not paranoid or delusional. Thought content does not include homicidal or suicidal ideation. Thought content does not include homicidal or suicidal plan.        Cognition and Memory: Cognition and memory normal.        Judgment: Judgment normal.     Comments: Insight intact     Lab Review:     Component Value Date/Time   NA 140 08/02/2023 1543   NA 135 02/01/2019 1012  K 3.9 08/02/2023 1543   CL 102 08/02/2023 1543   CO2 28 08/02/2023 1543   GLUCOSE 88 08/02/2023 1543   BUN 8 08/02/2023 1543   BUN 8 02/01/2019 1012    CREATININE 0.80 08/02/2023 1543   CALCIUM 9.2 08/02/2023 1543   PROT 6.8 03/07/2023 0256   PROT 6.9 02/01/2019 1012   ALBUMIN 2.9 (L) 03/07/2023 0256   ALBUMIN 4.1 02/01/2019 1012   AST 18 03/07/2023 0256   ALT 23 03/07/2023 0256   ALKPHOS 136 (H) 03/07/2023 0256   BILITOT 0.2 (L) 03/07/2023 0256   BILITOT 0.3 02/01/2019 1012   GFRNONAA >60 08/02/2023 1543   GFRAA 127 02/01/2019 1012       Component Value Date/Time   WBC 10.3 08/02/2023 1543   RBC 4.89 08/02/2023 1543   HGB 13.8 08/02/2023 1543   HGB 14.2 02/01/2019 1012   HCT 43.7 08/02/2023 1543   HCT 41.8 02/01/2019 1012   PLT 478 (H) 08/02/2023 1543   PLT 518 (H) 02/01/2019 1012   MCV 89.4 08/02/2023 1543   MCV 96 02/01/2019 1012   MCH 28.2 08/02/2023 1543   MCHC 31.6 08/02/2023 1543   RDW 13.6 08/02/2023 1543   RDW 13.6 02/01/2019 1012   LYMPHSABS 1.2 03/07/2023 0256   LYMPHSABS 3.7 (H) 02/01/2019 1012   MONOABS 0.1 03/07/2023 0256   EOSABS 0.0 03/07/2023 0256   EOSABS 0.3 02/01/2019 1012   BASOSABS 0.0 03/07/2023 0256   BASOSABS 0.1 02/01/2019 1012    No results found for: "POCLITH", "LITHIUM"   No results found for: "PHENYTOIN", "PHENOBARB", "VALPROATE", "CBMZ"   .res Assessment: Plan:    Plan:  PDMP reviewed  1. Lamictal 200mg  daily 2. Valium 10mg  TID  3. Atarax 25mg  up to 6 times daily  RTC 3 months  25 minutes spent dedicated to the care of this patient on the date of this encounter to include pre-visit review of records, ordering of medication, post visit documentation, and face-to-face time with the patient discussing BPD-1, PTSD, anxiety, and somatic symptom disorder. Discussed continuing current medication regimen.  Patient advised to contact office with any questions, adverse effects, or acute worsening in signs and symptoms.  Discussed potential benefits, risk, and side effects of benzodiazepines to include potential risk of tolerance and dependence, as well as possible drowsiness.  Advised  patient not to drive if experiencing drowsiness and to take lowest possible effective dose to minimize risk of dependence and tolerance.  Counseled patient regarding potential benefits, risks, and side effects of Lamictal to include potential risk of Stevens-Johnson syndrome. Advised patient to stop taking Lamictal and contact office immediately if rash develops and to seek urgent medical attention if rash is severe and/or spreading quickly. Diagnoses and all orders for this visit:  Bipolar I disorder, current or most recent episode depressed, in partial remission with mixed features (HCC) -     diazepam (VALIUM) 10 MG tablet; Take 1 tablet (10 mg total) by mouth 3 (three) times daily as needed for anxiety.  Chronic post-traumatic stress disorder  Somatic symptom disorder  Generalized anxiety disorder     Please see After Visit Summary for patient specific instructions.  No future appointments.  No orders of the defined types were placed in this encounter.     -------------------------------

## 2023-10-01 ENCOUNTER — Other Ambulatory Visit: Payer: Self-pay | Admitting: Internal Medicine

## 2023-11-11 ENCOUNTER — Other Ambulatory Visit: Payer: Self-pay | Admitting: Adult Health

## 2023-11-11 DIAGNOSIS — F451 Undifferentiated somatoform disorder: Secondary | ICD-10-CM

## 2023-11-11 DIAGNOSIS — F4312 Post-traumatic stress disorder, chronic: Secondary | ICD-10-CM

## 2023-12-30 ENCOUNTER — Other Ambulatory Visit (HOSPITAL_COMMUNITY): Payer: Self-pay | Admitting: Internal Medicine

## 2023-12-30 DIAGNOSIS — R319 Hematuria, unspecified: Secondary | ICD-10-CM

## 2023-12-30 DIAGNOSIS — R809 Proteinuria, unspecified: Secondary | ICD-10-CM

## 2024-01-05 ENCOUNTER — Ambulatory Visit (HOSPITAL_COMMUNITY)

## 2024-01-09 ENCOUNTER — Other Ambulatory Visit: Payer: Self-pay | Admitting: Adult Health

## 2024-01-09 DIAGNOSIS — F451 Undifferentiated somatoform disorder: Secondary | ICD-10-CM

## 2024-01-09 DIAGNOSIS — F3175 Bipolar disorder, in partial remission, most recent episode depressed: Secondary | ICD-10-CM

## 2024-01-09 DIAGNOSIS — F4312 Post-traumatic stress disorder, chronic: Secondary | ICD-10-CM

## 2024-01-09 NOTE — Telephone Encounter (Signed)
 Was due for FU in May. Sent MyChart msg to schedule appt.

## 2024-01-14 ENCOUNTER — Encounter: Payer: Self-pay | Admitting: Adult Health

## 2024-01-14 ENCOUNTER — Telehealth: Admitting: Adult Health

## 2024-01-14 DIAGNOSIS — F319 Bipolar disorder, unspecified: Secondary | ICD-10-CM | POA: Diagnosis not present

## 2024-01-14 DIAGNOSIS — F451 Undifferentiated somatoform disorder: Secondary | ICD-10-CM

## 2024-01-14 DIAGNOSIS — F4310 Post-traumatic stress disorder, unspecified: Secondary | ICD-10-CM | POA: Diagnosis not present

## 2024-01-14 DIAGNOSIS — F419 Anxiety disorder, unspecified: Secondary | ICD-10-CM | POA: Diagnosis not present

## 2024-01-14 DIAGNOSIS — F411 Generalized anxiety disorder: Secondary | ICD-10-CM

## 2024-01-14 DIAGNOSIS — F4312 Post-traumatic stress disorder, chronic: Secondary | ICD-10-CM

## 2024-01-14 DIAGNOSIS — F3175 Bipolar disorder, in partial remission, most recent episode depressed: Secondary | ICD-10-CM

## 2024-01-14 MED ORDER — DIAZEPAM 10 MG PO TABS: 10.0000 mg | ORAL_TABLET | Freq: Three times a day (TID) | ORAL | 2 refills | Status: DC | PRN

## 2024-01-14 MED ORDER — LAMOTRIGINE 200 MG PO TABS: 200.0000 mg | ORAL_TABLET | Freq: Every day | ORAL | 5 refills | Status: DC

## 2024-01-14 MED ORDER — HYDROXYZINE HCL 25 MG PO TABS: ORAL_TABLET | ORAL | 2 refills | Status: DC

## 2024-01-14 NOTE — Progress Notes (Signed)
 Shannon Spears [id number] 1981/07/01 43 y.o.  Virtual Visit via Video Note  I connected with pt @ on 01/14/24 at 12:00 PM EDT by a video enabled telemedicine application and verified that I am speaking with the correct person using two identifiers.   I discussed the limitations of evaluation and management by telemedicine and the availability of in person appointments. The patient expressed understanding and agreed to proceed.  I discussed the assessment and treatment plan with the patient. The patient was provided an opportunity to ask questions and all were answered. The patient agreed with the plan and demonstrated an understanding of the instructions.   The patient was advised to call back or seek an in-person evaluation if the symptoms worsen or if the condition fails to improve as anticipated.  I provided 25 minutes of non-face-to-face time during this encounter.  The patient was located at home.  The provider was located at [facility].   Reagan Camera, NP   Subjective:   Patient ID:  Shannon Spears is a 43 y.o. (DOB [birth year]) female.  Chief Complaint: No chief complaint on file.  HPI Shannon Spears presents for follow-up of BPD-1, PTSD, anxiety, and somatic symptom disorder.  Describes mood today as ok. Pleasant. Reports tearful at times. Mood symptoms - denies depression, but feels frustrated about a lot of things. Reports improved interest and motivation - coloring and crocheting. Reports anxiety - health issues.  Reports irritability - situational. Reports decreased panic attacks - minor ones. Reports worry, rumination and over thinking. Reports ongoing situational stressors. Reports general health and pain issues managed through [facility]. Reports mood is variable - it depends on how I feel when I wake up. Taking medications as prescribed. Energy levels vary. Active, does not have a regular exercise routine with physical disabilities. Enjoys some usual  interests and activities. Married. Lives with husband and daughter. Spending time with family. Reports appetite adequate. Reports weight stable - 200 pounds. Sleeps better some nights than others. Averages 7 hours. Reports focus and concentration difficulties - there's a lot going on. Completing tasks. Managing aspects of household. Stay at home mom. Denies SI or HI.  Denies AH or VH. Denies self harm.  Denies substance use.  Followed by pain management - [facility].  Previous medication trials: Unknown   Review of Systems:  Review of Systems  Musculoskeletal:  Negative for gait problem.  Neurological:  Negative for tremors.  Psychiatric/Behavioral:         Please refer to HPI    Medications: I have reviewed the patient's current medications.  Current Outpatient Medications  Medication Sig Dispense Refill   albuterol  (VENTOLIN  HFA) 108 (90 Base) MCG/ACT inhaler Inhale 2 puffs into the lungs every 6 (six) hours as needed for wheezing or shortness of breath. 6.7 g 2   Albuterol -Budesonide  (AIRSUPRA ) 90-80 MCG/ACT AERO Inhale 2 puffs into the lungs every 4 (four) hours as needed. 10.7 g 11   azelastine  (ASTELIN ) 0.1 % nasal spray Place 1 spray into both nostrils 2 (two) times daily.     CALCIUM -VITAMIN D PO Take 1 tablet by mouth daily.     cyclobenzaprine  (FLEXERIL ) 10 MG tablet Take 1 tablet (10 mg total) by mouth 2 (two) times daily as needed for muscle spasms. 20 tablet 0   diazepam  (VALIUM ) 10 MG tablet Take 1 tablet (10 mg total) by mouth 3 (three) times daily as needed for anxiety. 90 tablet 2   famotidine  (PEPCID  AC MAXIMUM STRENGTH) 20 MG tablet Take  20 mg by mouth 2 (two) times daily.     fluticasone-salmeterol (ADVAIR) 100-50 MCG/ACT AEPB Inhale into the lungs.     HYDROcodone -acetaminophen  (NORCO/VICODIN) 5-325 MG tablet Take 1 tablet by mouth every 6 (six) hours as needed. 8 tablet 0   hydrOXYzine  (ATARAX ) 25 MG tablet TAKE 1 TABLET BY MOUTH UP TO 6 TIMES DAILY AS  NEEDED FOR ANXIETY  ITCHING & SLEEP 180 tablet 0   ipratropium (ATROVENT) 0.06 % nasal spray Place into the nose.     Lactobacillus (PROBIOTIC ACIDOPHILUS PO) Take 1 capsule by mouth daily.     lamoTRIgine  (LAMICTAL ) 200 MG tablet Take 1 tablet (200 mg total) by mouth at bedtime. 30 tablet 5   levETIRAcetam  (KEPPRA ) 500 MG tablet Take 1,000 mg by mouth 2 (two) times daily.     levothyroxine (SYNTHROID) 25 MCG tablet Take 25 mcg by mouth daily.     lidocaine  (LIDODERM ) 5 % Place 1 patch onto the skin daily. Remove & Discard patch within 12 hours or as directed by MD 30 patch 0   methocarbamol  (ROBAXIN -750) 750 MG tablet Take 1 tablet (750 mg total) by mouth 2 (two) times daily as needed for muscle spasms. 20 tablet 2   metoprolol  succinate (TOPROL -XL) 25 MG 24 hr tablet Take 37.5 mg by mouth at bedtime.     montelukast  (SINGULAIR ) 10 MG tablet Take 10 mg by mouth daily.     nystatin (MYCOSTATIN) 100000 UNIT/ML suspension Take by mouth.     ondansetron  (ZOFRAN -ODT) 4 MG disintegrating tablet Take 1 tablet (4 mg total) by mouth every 8 (eight) hours as needed for nausea or vomiting. 20 tablet 0   pantoprazole  (PROTONIX ) 40 MG tablet TAKE 1 TABLET BY MOUTH EVERY DAY 30 TO 60 MINUTES BEFORE 1ST MEAL OF THE DAY 30 tablet 2   traMADol  (ULTRAM ) 50 MG tablet Take 50 mg by mouth every 4 (four) hours as needed for moderate pain (pain score 4-6).     Vitamin D, Ergocalciferol, 50000 units CAPS Take 1 capsule by mouth once a week. Thursday     No current facility-administered medications for this visit.    Medication Side Effects: None  Allergies:  Allergies  Allergen Reactions   Buprenorphine Shortness Of Breath and Rash    Breathing problems   Penicillins Anaphylaxis, Nausea And Vomiting and Swelling    TOLERATES ANCEF    Gabapentin Nausea And Vomiting   Nsaids     Renal insufficiency   Propoxyphene Nausea Only    darvocet   Tetanus Toxoids     Blister on legs, MRSA   Adhesive [Tape]  Dermatitis   Codeine     Mouth went numb   Ibuprofen     Has kidney disease.   Doxycycline Rash    Past Medical History:  Diagnosis Date   Anxiety    Arthritis    Bipolar 1 disorder, manic, mild (HCC)    CKD (chronic kidney disease)    Depression    Fibromyalgia    GERD (gastroesophageal reflux disease)    Headache(784.0)    Palpitations     Family History  Problem Relation Age of Onset   Bipolar disorder Mother    Diabetes Mother    Hypertension Mother    Hypertension Father    Colon cancer Paternal Grandmother     Social History   Socioeconomic History   Marital status: Married    Spouse name: Not on file   Number of children: Not on file   Years  of education: Not on file   Highest education level: Not on file  Occupational History   Not on file  Tobacco Use   Smoking status: Some Days    Current packs/day: 0.50    Types: Cigarettes   Smokeless tobacco: Never  Vaping Use   Vaping status: Never Used  Substance and Sexual Activity   Alcohol use: Yes    Comment: rarely   Drug use: Yes    Types: Marijuana    Comment: occasionally   Sexual activity: Not on file  Other Topics Concern   Not on file  Social History Narrative   Not on file   Social Drivers of Health   Financial Resource Strain: Low Risk  (10/28/2023)   Received from [facility]   Overall Financial Resource Strain (CARDIA)    Difficulty of Paying Living Expenses: Not hard at all  Food Insecurity: No Food Insecurity (10/28/2023)   [facility] from [facility]   Hunger Vital Sign    Worried About Running Out of Food in the Last Year: Never true    Ran Out of Food in the Last Year: Never true  Transportation Needs: No Transportation Needs (10/28/2023)   Received from [facility] - Transportation    Lack of Transportation (Medical): No    Lack of Transportation (Non-Medical): No  Physical Activity: Sufficiently Active (09/24/2021)   Received from [facility], [facility]    Exercise Vital Sign    Days of Exercise per Week: 7 days    Minutes of Exercise per Session: 40 min  Stress: Stress Concern Present (09/24/2021)   Received from [facility], [facility] - Occupational Stress Questionnaire    Feeling of Stress : Very much  Social Connections: Unknown (12/02/2021)   Received from [facility], [facility]   Social Network    Social Network: Not on file  Intimate Partner Violence: Not At Risk (01/18/2023)   Humiliation, Afraid, Rape, and Kick questionnaire    Fear of Current or Ex-Partner: No    Emotionally Abused: No    Physically Abused: No    Sexually Abused: No    Past Medical History, Surgical history, Social history, and Family history were reviewed and updated as appropriate.   Please see review of systems for further details on the patient's review from today.   Objective:   Physical Exam:  LMP 10/02/2012   Physical Exam Constitutional:      General: She is not in acute distress. Musculoskeletal:        General: No deformity.  Neurological:     Mental Status: She is alert and oriented to person, place, and time.     Coordination: Coordination normal.  Psychiatric:        Attention and Perception: Attention and perception normal. She does not perceive auditory or visual hallucinations.        Mood and Affect: Mood normal. Mood is not anxious or depressed. Affect is not labile, blunt, angry or inappropriate.        Speech: Speech normal.        Behavior: Behavior normal.        Thought Content: Thought content normal. Thought content is not paranoid or delusional. Thought content does not include homicidal or suicidal ideation. Thought content does not include homicidal or suicidal plan.        Cognition and Memory: Cognition and memory normal.        Judgment: Judgment  normal.     Comments: Insight intact     Lab Review:     Component Value Date/Time   NA 140 08/02/2023 1543   NA  135 02/01/2019 1012   K 3.9 08/02/2023 1543   CL 102 08/02/2023 1543   CO2 28 08/02/2023 1543   GLUCOSE 88 08/02/2023 1543   BUN 8 08/02/2023 1543   BUN 8 02/01/2019 1012   CREATININE 0.80 08/02/2023 1543   CALCIUM  9.2 08/02/2023 1543   PROT 6.8 03/07/2023 0256   PROT 6.9 02/01/2019 1012   ALBUMIN 2.9 (L) 03/07/2023 0256   ALBUMIN 4.1 02/01/2019 1012   AST 18 03/07/2023 0256   ALT 23 03/07/2023 0256   ALKPHOS 136 (H) 03/07/2023 0256   BILITOT 0.2 (L) 03/07/2023 0256   BILITOT 0.3 02/01/2019 1012   GFRNONAA >60 08/02/2023 1543   GFRAA 127 02/01/2019 1012       Component Value Date/Time   WBC 10.3 08/02/2023 1543   RBC 4.89 08/02/2023 1543   HGB 13.8 08/02/2023 1543   HGB 14.2 02/01/2019 1012   HCT 43.7 08/02/2023 1543   HCT 41.8 02/01/2019 1012   PLT 478 (H) 08/02/2023 1543   PLT 518 (H) 02/01/2019 1012   MCV 89.4 08/02/2023 1543   MCV 96 02/01/2019 1012   MCH 28.2 08/02/2023 1543   MCHC 31.6 08/02/2023 1543   RDW 13.6 08/02/2023 1543   RDW 13.6 02/01/2019 1012   LYMPHSABS 1.2 03/07/2023 0256   LYMPHSABS 3.7 (H) 02/01/2019 1012   MONOABS 0.1 03/07/2023 0256   EOSABS 0.0 03/07/2023 0256   EOSABS 0.3 02/01/2019 1012   BASOSABS 0.0 03/07/2023 0256   BASOSABS 0.1 02/01/2019 1012    No results found for: POCLITH, LITHIUM   No results found for: PHENYTOIN, PHENOBARB, VALPROATE, CBMZ   .res Assessment: Plan:    Plan:  PDMP reviewed  1. Lamictal  200mg  daily 2. Valium  10mg  TID  3. Atarax  25mg  up to 6 times daily  RTC 3 months  25 minutes spent dedicated to the care of this patient on the date of this encounter to include pre-visit review of records, ordering of medication, post visit documentation, and face-to-face time with the patient discussing BPD-1, PTSD, anxiety, and somatic symptom disorder. Discussed continuing current medication regimen.  Patient advised to contact office with any questions, adverse effects, or acute worsening in signs and  symptoms.  Discussed potential benefits, risk, and side effects of benzodiazepines to include potential risk of tolerance and dependence, as well as possible drowsiness.  Advised patient not to drive if experiencing drowsiness and to take lowest possible effective dose to minimize risk of dependence and tolerance.  Counseled patient regarding potential benefits, risks, and side effects of Lamictal  to include potential risk of Stevens-Johnson syndrome. Advised patient to stop taking Lamictal  and contact office immediately if rash develops and to seek urgent medical attention if rash is severe and/or spreading quickly.  There are no diagnoses linked to this encounter.   Please see After Visit Summary for patient specific instructions.  Future Appointments  Date Time Provider Department Center  01/14/2024 12:00 PM Joselle Deeds Nattalie, NP [facility] None  02/18/2024  8:00 AM [facility]  2 [facility]  [facility]    No orders of the defined types were placed in this encounter.     -------------------------------

## 2024-02-05 ENCOUNTER — Telehealth: Payer: Self-pay | Admitting: Adult Health

## 2024-02-05 DIAGNOSIS — F4312 Post-traumatic stress disorder, chronic: Secondary | ICD-10-CM

## 2024-02-05 DIAGNOSIS — F3175 Bipolar disorder, in partial remission, most recent episode depressed: Secondary | ICD-10-CM

## 2024-02-05 DIAGNOSIS — F451 Undifferentiated somatoform disorder: Secondary | ICD-10-CM

## 2024-02-05 NOTE — Telephone Encounter (Signed)
 Pt Lvm @ 12:40p to switch her Diazepam , Hydroxyzine  and Lamotrigine  from [facility] to [facility].  Next appt 9/4

## 2024-02-09 MED ORDER — LAMOTRIGINE 200 MG PO TABS: 200.0000 mg | ORAL_TABLET | Freq: Every day | ORAL | 3 refills | Status: DC

## 2024-02-09 MED ORDER — HYDROXYZINE HCL 25 MG PO TABS: ORAL_TABLET | ORAL | 0 refills | Status: DC

## 2024-02-09 NOTE — Telephone Encounter (Signed)
 Lamictal  and hydroxyzine  sent to [facility]. Pended diazepam .

## 2024-02-11 ENCOUNTER — Other Ambulatory Visit: Payer: Self-pay

## 2024-02-11 DIAGNOSIS — F3175 Bipolar disorder, in partial remission, most recent episode depressed: Secondary | ICD-10-CM

## 2024-02-11 MED ORDER — DIAZEPAM 10 MG PO TABS: 10.0000 mg | ORAL_TABLET | Freq: Three times a day (TID) | ORAL | 1 refills | Status: DC | PRN

## 2024-02-18 ENCOUNTER — Encounter (HOSPITAL_COMMUNITY): Payer: Self-pay

## 2024-02-18 ENCOUNTER — Other Ambulatory Visit (HOSPITAL_COMMUNITY)

## 2024-02-21 ENCOUNTER — Other Ambulatory Visit: Payer: Self-pay | Admitting: Internal Medicine

## 2024-04-08 ENCOUNTER — Telehealth: Admitting: Adult Health

## 2024-04-08 ENCOUNTER — Encounter: Payer: Self-pay | Admitting: Adult Health

## 2024-04-08 DIAGNOSIS — F4312 Post-traumatic stress disorder, chronic: Secondary | ICD-10-CM | POA: Diagnosis not present

## 2024-04-08 DIAGNOSIS — F319 Bipolar disorder, unspecified: Secondary | ICD-10-CM | POA: Diagnosis not present

## 2024-04-08 DIAGNOSIS — F411 Generalized anxiety disorder: Secondary | ICD-10-CM

## 2024-04-08 DIAGNOSIS — F451 Undifferentiated somatoform disorder: Secondary | ICD-10-CM

## 2024-04-08 MED ORDER — LAMOTRIGINE 200 MG PO TABS: 200.0000 mg | ORAL_TABLET | Freq: Every day | ORAL | 3 refills | Status: DC

## 2024-04-08 MED ORDER — DIAZEPAM 10 MG PO TABS: 10.0000 mg | ORAL_TABLET | Freq: Three times a day (TID) | ORAL | 2 refills | Status: DC | PRN

## 2024-04-08 MED ORDER — HYDROXYZINE HCL 25 MG PO TABS: ORAL_TABLET | ORAL | 2 refills | Status: DC

## 2024-04-08 NOTE — Progress Notes (Signed)
 Shannon Spears [id number] 1981/01/02 43 y.o.  Virtual Visit via Video Note  I connected with pt @ on 04/08/24 at 11:30 AM EDT by a video enabled telemedicine application and verified that I am speaking with the correct person using two identifiers.   I discussed the limitations of evaluation and management by telemedicine and the availability of in person appointments. The patient expressed understanding and agreed to proceed.  I discussed the assessment and treatment plan with the patient. The patient was provided an opportunity to ask questions and all were answered. The patient agreed with the plan and demonstrated an understanding of the instructions.   The patient was advised to call back or seek an in-person evaluation if the symptoms worsen or if the condition fails to improve as anticipated.  I provided 25 minutes of non-face-to-face time during this encounter.  The patient was located at home.  The provider was located at [facility].   Angeline LOISE Sayers, NP   Subjective:   Patient ID:  Shannon Spears is a 43 y.o. (DOB 1981/06/23) female.  Chief Complaint: No chief complaint on file.   HPI Marshay Slates presents for follow-up of BPD-1, PTSD, anxiety, and somatic symptom disorder.  Describes mood today as not the best. Pleasant. Reports tearfulness at times. Mood symptoms - denies depression - I don't know how I feel.  Reports decreased interest and motivation. Reports anxiety - I am constantly stressed. Reports irritability - all the time, because I have a lot of pressure put on me. Reports panic attacks - when things get out of control. Reports worry, rumination and over thinking. Reports obsessive thoughts and acts. Reports ongoing situational stressors. Reports mood is variable - it's more down than up. Stating I feel miserable. Taking medications as prescribed. Reports her general health and pain issues managed through [facility]. Energy levels vary.  Active, does not have a regular exercise routine with physical disabilities. Enjoys some usual interests and activities. Married. Lives with husband and daughter. Spending time with family. Reports appetite adequate. Reports weight stable - 200 pounds. Sleeps better some nights than others. Averages 6 hours - pain. Reports some daytime napping Reports focus and concentration difficulties - I feel like I'm checked out, but I have a lot of things going on. Completing tasks. Managing aspects of household. Stay at home mom. Denies SI or HI.  Denies AH or VH. Denies self harm.  Denies substance use.  Followed by pain management - [facility].  Previous medication trials: Unknown  Review of Systems:  Review of Systems  Musculoskeletal:  Negative for gait problem.  Neurological:  Negative for tremors.  Psychiatric/Behavioral:         Please refer to HPI    Medications: I have reviewed the patient's current medications.  Current Outpatient Medications  Medication Sig Dispense Refill   albuterol  (VENTOLIN  HFA) 108 (90 Base) MCG/ACT inhaler Inhale 2 puffs into the lungs every 6 (six) hours as needed for wheezing or shortness of breath. 6.7 g 2   Albuterol -Budesonide  (AIRSUPRA ) 90-80 MCG/ACT AERO Inhale 2 puffs into the lungs every 4 (four) hours as needed. 10.7 g 11   azelastine  (ASTELIN ) 0.1 % nasal spray Place 1 spray into both nostrils 2 (two) times daily.     CALCIUM -VITAMIN D PO Take 1 tablet by mouth daily.     cyclobenzaprine  (FLEXERIL ) 10 MG tablet Take 1 tablet (10 mg total) by mouth 2 (two) times daily as needed for muscle spasms. 20 tablet 0  diazepam  (VALIUM ) 10 MG tablet Take 1 tablet (10 mg total) by mouth 3 (three) times daily as needed for anxiety. 90 tablet 1   famotidine  (PEPCID  AC MAXIMUM STRENGTH) 20 MG tablet Take 20 mg by mouth 2 (two) times daily.     fluticasone-salmeterol (ADVAIR) 100-50 MCG/ACT AEPB Inhale into the lungs.     HYDROcodone -acetaminophen   (NORCO/VICODIN) 5-325 MG tablet Take 1 tablet by mouth every 6 (six) hours as needed. 8 tablet 0   hydrOXYzine  (ATARAX ) 25 MG tablet TAKE 1 TABLET BY MOUTH UP TO 6 TIMES DAILY AS NEEDED FOR ANXIETY  ITCHING & SLEEP 180 tablet 0   ipratropium (ATROVENT) 0.06 % nasal spray Place into the nose.     Lactobacillus (PROBIOTIC ACIDOPHILUS PO) Take 1 capsule by mouth daily.     lamoTRIgine  (LAMICTAL ) 200 MG tablet Take 1 tablet (200 mg total) by mouth at bedtime. 30 tablet 3   levETIRAcetam  (KEPPRA ) 500 MG tablet Take 1,000 mg by mouth 2 (two) times daily.     levothyroxine (SYNTHROID) 25 MCG tablet Take 25 mcg by mouth daily.     lidocaine  (LIDODERM ) 5 % Place 1 patch onto the skin daily. Remove & Discard patch within 12 hours or as directed by MD 30 patch 0   methocarbamol  (ROBAXIN -750) 750 MG tablet Take 1 tablet (750 mg total) by mouth 2 (two) times daily as needed for muscle spasms. 20 tablet 2   metoprolol  succinate (TOPROL -XL) 25 MG 24 hr tablet Take 37.5 mg by mouth at bedtime.     montelukast  (SINGULAIR ) 10 MG tablet Take 10 mg by mouth daily.     nystatin (MYCOSTATIN) 100000 UNIT/ML suspension Take by mouth.     ondansetron  (ZOFRAN -ODT) 4 MG disintegrating tablet Take 1 tablet (4 mg total) by mouth every 8 (eight) hours as needed for nausea or vomiting. 20 tablet 0   pantoprazole  (PROTONIX ) 40 MG tablet TAKE 1 TABLET BY MOUTH EVERY DAY 30 TO 60 MINUTES BEFORE 1ST MEAL OF THE DAY 30 tablet 2   traMADol  (ULTRAM ) 50 MG tablet Take 50 mg by mouth every 4 (four) hours as needed for moderate pain (pain score 4-6).     Vitamin D, Ergocalciferol, 50000 units CAPS Take 1 capsule by mouth once a week. Thursday     No current facility-administered medications for this visit.    Medication Side Effects: None  Allergies:  Allergies  Allergen Reactions   Buprenorphine Shortness Of Breath and Rash    Breathing problems   Penicillins Anaphylaxis, Nausea And Vomiting and Swelling    TOLERATES ANCEF     Gabapentin Nausea And Vomiting   Nsaids     Renal insufficiency   Propoxyphene Nausea Only    darvocet   Tetanus Toxoids     Blister on legs, MRSA   Adhesive [Tape] Dermatitis   Codeine     Mouth went numb   Ibuprofen     Has kidney disease.   Doxycycline Rash    Past Medical History:  Diagnosis Date   Anxiety    Arthritis    Bipolar 1 disorder, manic, mild (HCC)    CKD (chronic kidney disease)    Depression    Fibromyalgia    GERD (gastroesophageal reflux disease)    Headache(784.0)    Palpitations     Family History  Problem Relation Age of Onset   Bipolar disorder Mother    Diabetes Mother    Hypertension Mother    Hypertension Father    Colon cancer Paternal  Grandmother     Social History   Socioeconomic History   Marital status: Married    Spouse name: Not on file   Number of children: Not on file   Years of education: Not on file   Highest education level: Not on file  Occupational History   Not on file  Tobacco Use   Smoking status: Some Days    Current packs/day: 0.50    Types: Cigarettes   Smokeless tobacco: Never  Vaping Use   Vaping status: Never Used  Substance and Sexual Activity   Alcohol use: Yes    Comment: rarely   Drug use: Yes    Types: Marijuana    Comment: occasionally   Sexual activity: Not on file  Other Topics Concern   Not on file  Social History Narrative   Not on file   Social Drivers of Health   Financial Resource Strain: Low Risk  (10/28/2023)   Received [facility]   Overall Financial Resource Strain (CARDIA)    Difficulty of Paying Living Expenses: Not hard at all  Food Insecurity: No Food Insecurity (10/28/2023)   Received from [facility]   Hunger Vital Sign    Within the past 12 months, you worried that your food would run out before you got the money to buy more.: Never true    Within the past 12 months, the food you bought just didn't last and you didn't have money to get more.: Never true   Transportation Needs: No Transportation Needs (10/28/2023)   Received from [facility] - Transportation    Lack of Transportation (Medical): No    Lack of Transportation (Non-Medical): No  Physical Activity: Sufficiently Active (09/24/2021)   Received from [facility]   Exercise Vital Sign    On average, how many days per week do you engage in moderate to strenuous exercise (like a brisk walk)?: 7 days    On average, how many minutes do you engage in exercise at this level?: 40 min  Stress: Stress Concern Present (09/24/2021)   Received from [facility] - Occupational Stress Questionnaire    Feeling of Stress : Very much  Social Connections: Unknown (12/02/2021)   Received from [facility]   Social Network    Social Network: Not on file  Intimate Partner Violence: Not At Risk (01/18/2023)   Humiliation, Afraid, Rape, and Kick questionnaire    Fear of Current or Ex-Partner: No    Emotionally Abused: No    Physically Abused: No    Sexually Abused: No    Past Medical History, Surgical history, Social history, and Family history were reviewed and updated as appropriate.   Please see review of systems for further details on the patient's review from today.   Objective:   Physical Exam:  LMP 10/02/2012   Physical Exam Constitutional:      General: She is not in acute distress. Musculoskeletal:        General: No deformity.  Neurological:     Mental Status: She is alert and oriented to person, place, and time.     Coordination: Coordination normal.  Psychiatric:        Attention and Perception: Attention and perception normal. She does not perceive auditory or visual hallucinations.        Mood and Affect: Mood normal. Mood is not anxious or depressed. Affect is not labile, blunt, angry or inappropriate.  Speech: Speech normal.        Behavior: Behavior normal.        Thought Content: Thought content normal.  Thought content is not paranoid or delusional. Thought content does not include homicidal or suicidal ideation. Thought content does not include homicidal or suicidal plan.        Cognition and Memory: Cognition and memory normal.        Judgment: Judgment normal.     Comments: Insight intact     Lab Review:     Component Value Date/Time   NA 140 08/02/2023 1543   NA 135 02/01/2019 1012   K 3.9 08/02/2023 1543   CL 102 08/02/2023 1543   CO2 28 08/02/2023 1543   GLUCOSE 88 08/02/2023 1543   BUN 8 08/02/2023 1543   BUN 8 02/01/2019 1012   CREATININE 0.80 08/02/2023 1543   CALCIUM  9.2 08/02/2023 1543   PROT 6.8 03/07/2023 0256   PROT 6.9 02/01/2019 1012   ALBUMIN 2.9 (L) 03/07/2023 0256   ALBUMIN 4.1 02/01/2019 1012   AST 18 03/07/2023 0256   ALT 23 03/07/2023 0256   ALKPHOS 136 (H) 03/07/2023 0256   BILITOT 0.2 (L) 03/07/2023 0256   BILITOT 0.3 02/01/2019 1012   GFRNONAA >60 08/02/2023 1543   GFRAA 127 02/01/2019 1012       Component Value Date/Time   WBC 10.3 08/02/2023 1543   RBC 4.89 08/02/2023 1543   HGB 13.8 08/02/2023 1543   HGB 14.2 02/01/2019 1012   HCT 43.7 08/02/2023 1543   HCT 41.8 02/01/2019 1012   PLT 478 (H) 08/02/2023 1543   PLT 518 (H) 02/01/2019 1012   MCV 89.4 08/02/2023 1543   MCV 96 02/01/2019 1012   MCH 28.2 08/02/2023 1543   MCHC 31.6 08/02/2023 1543   RDW 13.6 08/02/2023 1543   RDW 13.6 02/01/2019 1012   LYMPHSABS 1.2 03/07/2023 0256   LYMPHSABS 3.7 (H) 02/01/2019 1012   MONOABS 0.1 03/07/2023 0256   EOSABS 0.0 03/07/2023 0256   EOSABS 0.3 02/01/2019 1012   BASOSABS 0.0 03/07/2023 0256   BASOSABS 0.1 02/01/2019 1012    No results found for: POCLITH, LITHIUM   No results found for: PHENYTOIN, PHENOBARB, VALPROATE, CBMZ   .res Assessment: Plan:    Plan:  PDMP reviewed  1. Lamictal  200mg  daily 2. Valium  10mg  TID  3. Atarax  25mg  up to 6 times daily - taking 3 to 4 daily  RTC 3 months  25 minutes spent dedicated to  the care of this patient on the date of this encounter to include pre-visit review of records, ordering of medication, post visit documentation, and face-to-face time with the patient discussing BPD-1, PTSD, anxiety, and somatic symptom disorder. Discussed continuing current medication regimen.  Patient advised to contact office with any questions, adverse effects, or acute worsening in signs and symptoms.  Discussed potential benefits, risk, and side effects of benzodiazepines to include potential risk of tolerance and dependence, as well as possible drowsiness.  Advised patient not to drive if experiencing drowsiness and to take lowest possible effective dose to minimize risk of dependence and tolerance.  Counseled patient regarding potential benefits, risks, and side effects of Lamictal  to include potential risk of Stevens-Johnson syndrome. Advised patient to stop taking Lamictal  and contact office immediately if rash develops and to seek urgent medical attention if rash is severe and/or spreading quickly.  Diagnoses and all orders for this visit:  Bipolar I disorder (HCC)  Chronic post-traumatic stress disorder  Somatic symptom  disorder  Generalized anxiety disorder     Please see After Visit Summary for patient specific instructions.  Future Appointments  Date Time Provider Department Center  04/08/2024 11:30 AM Teofilo Lupinacci Nattalie, NP [facility] None    No orders of the defined types were placed in this encounter.     -------------------------------

## 2024-06-07 ENCOUNTER — Encounter: Payer: Self-pay | Admitting: Radiology

## 2024-06-11 ENCOUNTER — Encounter (HOSPITAL_COMMUNITY): Payer: Self-pay

## 2024-06-11 ENCOUNTER — Other Ambulatory Visit: Payer: Self-pay

## 2024-06-11 ENCOUNTER — Emergency Department (HOSPITAL_COMMUNITY): Payer: Self-pay

## 2024-06-11 ENCOUNTER — Emergency Department (HOSPITAL_COMMUNITY)
Admission: EM | Admit: 2024-06-11 | Discharge: 2024-06-11 | Disposition: A | Discharge status: Home / Self Care | Attending: Emergency Medicine | Admitting: Emergency Medicine

## 2024-06-11 DIAGNOSIS — R109 Unspecified abdominal pain: Secondary | ICD-10-CM

## 2024-06-11 DIAGNOSIS — D72829 Elevated white blood cell count, unspecified: Secondary | ICD-10-CM | POA: Diagnosis not present

## 2024-06-11 DIAGNOSIS — F17210 Nicotine dependence, cigarettes, uncomplicated: Secondary | ICD-10-CM | POA: Insufficient documentation

## 2024-06-11 DIAGNOSIS — N189 Chronic kidney disease, unspecified: Secondary | ICD-10-CM | POA: Diagnosis not present

## 2024-06-11 DIAGNOSIS — R000 Tachycardia, unspecified: Secondary | ICD-10-CM | POA: Diagnosis not present

## 2024-06-11 DIAGNOSIS — R1031 Right lower quadrant pain: Secondary | ICD-10-CM | POA: Diagnosis present

## 2024-06-11 DIAGNOSIS — R110 Nausea: Secondary | ICD-10-CM | POA: Insufficient documentation

## 2024-06-11 LAB — URINALYSIS, ROUTINE W REFLEX MICROSCOPIC
Bilirubin Urine: NEGATIVE
Glucose, UA: NEGATIVE mg/dL
Ketones, ur: NEGATIVE mg/dL
Leukocytes,Ua: NEGATIVE
Nitrite: NEGATIVE
Protein, ur: 100 mg/dL — AB
Specific Gravity, Urine: 1.01 (ref 1.005–1.030)
pH: 5 (ref 5.0–8.0)

## 2024-06-11 LAB — COMPREHENSIVE METABOLIC PANEL WITH GFR
ALT: 25 U/L (ref 0–44)
AST: 21 U/L (ref 15–41)
Albumin: 3.2 g/dL — ABNORMAL LOW (ref 3.5–5.0)
Alkaline Phosphatase: 145 U/L — ABNORMAL HIGH (ref 38–126)
Anion gap: 12 (ref 5–15)
BUN: 10 mg/dL (ref 6–20)
CO2: 27 mmol/L (ref 22–32)
Calcium: 8.7 mg/dL — ABNORMAL LOW (ref 8.9–10.3)
Chloride: 101 mmol/L (ref 98–111)
Creatinine, Ser: 0.89 mg/dL (ref 0.44–1.00)
GFR, Estimated: >60 mL/min (ref >60)
Glucose, Bld: 100 mg/dL — ABNORMAL HIGH (ref 70–99)
Potassium: 4.2 mmol/L (ref 3.5–5.1)
Sodium: 140 mmol/L (ref 135–145)
Total Bilirubin: 0.3 mg/dL (ref 0.0–1.2)
Total Protein: 7.1 g/dL (ref 6.5–8.1)

## 2024-06-11 LAB — CBC
HCT: 43.7 % (ref 36.0–46.0)
Hemoglobin: 13.9 g/dL (ref 12.0–15.0)
MCH: 29.9 pg (ref 26.0–34.0)
MCHC: 31.8 g/dL (ref 30.0–36.0)
MCV: 94 fL (ref 80.0–100.0)
Platelets: 493 K/uL — ABNORMAL HIGH (ref 150–400)
RBC: 4.65 MIL/uL (ref 3.87–5.11)
RDW: 15.2 % (ref 11.5–15.5)
WBC: 11.5 K/uL — ABNORMAL HIGH (ref 4.0–10.5)
nRBC: 0 % (ref 0.0–0.2)

## 2024-06-11 LAB — LIPASE, BLOOD: Lipase: 26 U/L (ref 11–51)

## 2024-06-11 MED ORDER — DICYCLOMINE HCL 20 MG PO TABS
20.0000 mg | ORAL_TABLET | Freq: Two times a day (BID) | ORAL | 0 refills | Status: AC
Start: 2024-06-11 — End: ?

## 2024-06-11 MED ORDER — DICYCLOMINE HCL 20 MG PO TABS: 20.0000 mg | ORAL_TABLET | Freq: Two times a day (BID) | ORAL | 0 refills | Status: DC

## 2024-06-11 MED ORDER — SODIUM CHLORIDE 0.9 % IV BOLUS
1000.0000 mL | Freq: Once | INTRAVENOUS | Status: AC
  Administered 2024-06-11: 1000 mL via INTRAVENOUS

## 2024-06-11 MED ORDER — MORPHINE SULFATE (PF) 4 MG/ML IV SOLN
4.0000 mg | Freq: Once | INTRAVENOUS | Status: AC
  Administered 2024-06-11: 4 mg via INTRAVENOUS
  Filled 2024-06-11: qty 1

## 2024-06-11 MED ORDER — ONDANSETRON HCL 4 MG PO TABS: 4.0000 mg | ORAL_TABLET | Freq: Three times a day (TID) | ORAL | 0 refills | Status: DC | PRN

## 2024-06-11 MED ORDER — ONDANSETRON HCL 4 MG PO TABS: 4.0000 mg | ORAL_TABLET | Freq: Three times a day (TID) | ORAL | 0 refills | Status: AC | PRN

## 2024-06-11 MED ORDER — IOHEXOL 350 MG/ML SOLN
75.0000 mL | Freq: Once | INTRAVENOUS | Status: AC | PRN
  Administered 2024-06-11: 75 mL via INTRAVENOUS

## 2024-06-11 MED ORDER — ONDANSETRON 4 MG PO TBDP
4.0000 mg | ORAL_TABLET | Freq: Once | ORAL | Status: AC | PRN
  Administered 2024-06-11: 4 mg via ORAL
  Filled 2024-06-11: qty 1

## 2024-06-11 NOTE — ED Triage Notes (Signed)
 Pt c/o right lower abdominal pain for past 3 days. Pt has had nausea, denies vomiting. Pt states she went to PCP yesterday and was told to come to ED d/t concern for appendicitis.

## 2024-06-11 NOTE — ED Provider Notes (Signed)
 [place] EMERGENCY DEPARTMENT AT [facility] Provider Note  CSN: [id number] Arrival date & time: 06/11/24 1046  Chief Complaint(s) Abdominal Pain  HPI Shannon Spears is a 43 y.o. female with past medical history as below, significant for anxiety, bipolar 1 disorder, CKD, fibromyalgia, chronic opiate use who presents to the ED with complaint of right lower quad abdominal pain, nausea  Surgical history includes hysterectomy and cholecystectomy  Shannon Spears with right sided abdominal pain over the past 2 to 3 days.  Initially began right flank then progressed to right lower quadrant.  Nausea vomiting, poor appetite, last p.o. was yesterday evening.  No fevers or chills.  No change in bowel or bladder function.  Denies prior kidney stone  Past Medical History Past Medical History:  Diagnosis Date   Anxiety    Arthritis    Bipolar 1 disorder, manic, mild (HCC)    CKD (chronic kidney disease)    Depression    Fibromyalgia    GERD (gastroesophageal reflux disease)    Headache(784.0)    Palpitations    Shannon Spears Active Problem List   Diagnosis Date Noted   Cough variant asthma vs UACS with VCD 04/30/2023   Acute hypoxic respiratory failure (HCC) 03/06/2023   CAP (community acquired pneumonia) 01/18/2023   Sepsis (HCC) 01/18/2023   Atypical chest pain 01/18/2023   Sinus tachycardia 01/18/2023   GAD (generalized anxiety disorder) 01/18/2023   GERD (gastroesophageal reflux disease) 01/18/2023   Mild intermittent asthma 01/18/2023   Essential hypertension 01/18/2023   Fracture, stress, femur, neck, initial encounter 11/22/2022   Vitamin D deficiency 11/28/2020   Osteopenia 11/08/2020   Knee contusion 04/06/2019   Bipolar 1 disorder (HCC) 06/16/2018   Chronic post-traumatic stress disorder 06/16/2018   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   Opioid use disorder, mild, in sustained remission (HCC) 06/16/2018   S/P cholecystectomy 02/10/2018   Chronic recurrent pancreatitis (HCC)  01/06/2018   S/P cervical spinal fusion 12/08/2017   Protrusion of cervical intervertebral disc 12/08/2017   Anxiety 07/10/2017   Chronic vulvitis 07/10/2017   Irritable bowel syndrome 07/10/2017   Methicillin resistant Staphylococcus aureus infection 07/10/2017   Migraine 07/10/2017   Constipation 06/16/2017   Overweight (BMI 25.0-29.9) 11/11/2016   Acute respiratory insufficiency 06/21/2016   Asthmatic bronchitis with acute exacerbation 06/21/2016   Tobacco abuse disorder 06/21/2016   Fibromyalgia 10/09/2015   Hypercholesterolemia 10/09/2015   Thrombocytosis 09/13/2015   HNP (herniated nucleus pulposus), cervical 10/02/2012    Class: Diagnosis of   Home Medication(s) Prior to Admission medications   Medication Sig Start Date End Date Taking? Authorizing Provider  dicyclomine  (BENTYL ) 20 MG tablet Take 1 tablet (20 mg total) by mouth 2 (two) times daily. 06/11/24  Yes Elnor Savant A, DO  ondansetron  (ZOFRAN ) 4 MG tablet Take 1 tablet (4 mg total) by mouth every 8 (eight) hours as needed for nausea or vomiting. 06/11/24  Yes Elnor Savant LABOR, DO  albuterol  (VENTOLIN  HFA) 108 (90 Base) MCG/ACT inhaler Inhale 2 puffs into the lungs every 6 (six) hours as needed for wheezing or shortness of breath. 03/07/23   Francella Rogue, MD  Albuterol -Budesonide  (AIRSUPRA ) 90-80 MCG/ACT AERO Inhale 2 puffs into the lungs every 4 (four) hours as needed. 04/30/23   Darlean Ozell NOVAK, MD  azelastine  (ASTELIN ) 0.1 % nasal spray Place 1 spray into both nostrils 2 (two) times daily. 02/26/23   [provider]  CALCIUM -VITAMIN D PO Take 1 tablet by mouth daily.    [provider]  cyclobenzaprine  (FLEXERIL ) 10  MG tablet Take 1 tablet (10 mg total) by mouth 2 (two) times daily as needed for muscle spasms. 08/02/23   Gaetano Pac, MD  diazepam  (VALIUM ) 10 MG tablet Take 1 tablet (10 mg total) by mouth 3 (three) times daily as needed for anxiety. 04/08/24   Mozingo, Regina Nattalie, NP  famotidine  (PEPCID  AC  MAXIMUM STRENGTH) 20 MG tablet Take 20 mg by mouth 2 (two) times daily.    [provider]  fluticasone-salmeterol (ADVAIR) 100-50 MCG/ACT AEPB Inhale into the lungs. 04/25/23   [provider]  HYDROcodone -acetaminophen  (NORCO/VICODIN) 5-325 MG tablet Take 1 tablet by mouth every 6 (six) hours as needed. 08/02/23   Patt Alm Macho, MD  hydrOXYzine  (ATARAX ) 25 MG tablet TAKE 1 TABLET BY MOUTH UP TO 4 TIMES DAILY AS NEEDED FOR ANXIETY  ITCHING & SLEEP 04/08/24   Mozingo, Regina Nattalie, NP  ipratropium (ATROVENT) 0.06 % nasal spray Place into the nose. 03/20/23 03/19/24  [provider]  Lactobacillus (PROBIOTIC ACIDOPHILUS PO) Take 1 capsule by mouth daily.    [provider]  lamoTRIgine  (LAMICTAL ) 200 MG tablet Take 1 tablet (200 mg total) by mouth at bedtime. 04/08/24   Mozingo, Regina Nattalie, NP  levETIRAcetam  (KEPPRA ) 500 MG tablet Take 1,000 mg by mouth 2 (two) times daily.    [provider]  levothyroxine (SYNTHROID) 25 MCG tablet Take 25 mcg by mouth daily.    [provider]  lidocaine  (LIDODERM ) 5 % Place 1 patch onto the skin daily. Remove & Discard patch within 12 hours or as directed by MD 08/02/23   Gaetano Pac, MD  methocarbamol  (ROBAXIN -750) 750 MG tablet Take 1 tablet (750 mg total) by mouth 2 (two) times daily as needed for muscle spasms. 02/24/23   Jule Ronal CROME, PA-C  metoprolol  succinate (TOPROL -XL) 25 MG 24 hr tablet Take 37.5 mg by mouth at bedtime. 01/15/23   [provider]  montelukast  (SINGULAIR ) 10 MG tablet Take 10 mg by mouth daily.    [provider]  nystatin (MYCOSTATIN) 100000 UNIT/ML suspension Take by mouth. 04/16/23   [provider]  pantoprazole  (PROTONIX ) 40 MG tablet TAKE 1 TABLET BY MOUTH EVERY DAY 30 TO 60 MINUTES BEFORE 1ST MEAL OF THE DAY 10/01/23   Darlean Ozell NOVAK, MD  traMADol  (ULTRAM ) 50 MG tablet Take 50 mg by mouth every 4 (four) hours as needed for moderate pain (pain  score 4-6).    [provider]  Vitamin D, Ergocalciferol, 50000 units CAPS Take 1 capsule by mouth once a week. Thursday    [provider]                                                                                                                                    Past Surgical History Past Surgical History:  Procedure Laterality Date   ANTERIOR CERVICAL DECOMP/DISCECTOMY FUSION N/A 10/02/2012   Procedure: C4-5, C5-6 anterior cervical discectomy  and fusion, allograft plate;  Surgeon: Oneil JAYSON Herald, MD;  Location: [facility] OR;  Service: Orthopedics;  Laterality: N/A;   CHOLECYSTECTOMY     CYSTOGRAPHY     HIP PINNING,CANNULATED Right 11/22/2022   Procedure: RIGHT HIP PINNING;  Surgeon: Jerri Kay HERO, MD;  Location: [facility] OR;  Service: Orthopedics;  Laterality: Right;   LAPAROSCOPIC TOTAL HYSTERECTOMY     OOPHORECTOMY     TUBAL LIGATION     Family History Family History  Problem Relation Age of Onset   Bipolar disorder Mother    Diabetes Mother    Hypertension Mother    Hypertension Father    Colon cancer Paternal Grandmother     Social History Social History   Tobacco Use   Smoking status: Some Days    Current packs/day: 0.50    Types: Cigarettes   Smokeless tobacco: Never  Vaping Use   Vaping status: Never Used  Substance Use Topics   Alcohol use: Yes    Comment: rarely   Drug use: Yes    Types: Marijuana    Comment: occasionally   Allergies Buprenorphine, Penicillins, Gabapentin, Nsaids, Propoxyphene, Tetanus toxoid-containing vaccines, Adhesive [tape], Codeine, Ibuprofen, and Doxycycline  Review of Systems A thorough review of systems was obtained and all systems are negative except as noted in the HPI and PMH.   Physical Exam Vital Signs  I have reviewed the triage vital signs BP 114/85   Pulse 88   Temp 98 F (36.7 C) (Oral)   Resp 20   Ht 5' 1 (1.549 m)   Wt 88.5 kg   LMP 10/02/2012   SpO2 99%   BMI 36.84 kg/m  Physical Exam Vitals  and nursing note reviewed.  Constitutional:      General: Shannon Spears is not in acute distress.    Appearance: Normal appearance.  HENT:     Head: Normocephalic and atraumatic.     Right Ear: External ear normal.     Left Ear: External ear normal.     Nose: Nose normal.     Mouth/Throat:     Mouth: Mucous membranes are moist.  Eyes:     General: No scleral icterus.       Right eye: No discharge.        Left eye: No discharge.  Cardiovascular:     Rate and Rhythm: Regular rhythm. Tachycardia present.     Pulses: Normal pulses.     Heart sounds: Normal heart sounds.  Pulmonary:     Effort: Pulmonary effort is normal. No respiratory distress.     Breath sounds: Normal breath sounds. No stridor.  Abdominal:     General: Abdomen is flat. There is no distension.     Palpations: Abdomen is soft.     Tenderness: There is abdominal tenderness in the right lower quadrant. There is no guarding or rebound.  Musculoskeletal:     Cervical back: No rigidity.     Right lower leg: No edema.     Left lower leg: No edema.  Skin:    General: Skin is warm and dry.     Capillary Refill: Capillary refill takes less than 2 seconds.  Neurological:     Mental Status: Shannon Spears is alert.  Psychiatric:        Mood and Affect: Mood normal.        Behavior: Behavior normal. Behavior is cooperative.     ED Results and Treatments Labs (all labs ordered are listed, but only abnormal results are displayed)  Labs Reviewed  COMPREHENSIVE METABOLIC PANEL WITH GFR - Abnormal; Notable for the following components:      Result Value   Glucose, Bld 100 (*)    Calcium  8.7 (*)    Albumin 3.2 (*)    Alkaline Phosphatase 145 (*)    All other components within normal limits  CBC - Abnormal; Notable for the following components:   WBC 11.5 (*)    Platelets 493 (*)    All other components within normal limits  URINALYSIS, ROUTINE W REFLEX MICROSCOPIC - Abnormal; Notable for the following components:   Hgb urine dipstick  LARGE (*)    Protein, ur 100 (*)    Bacteria, UA MANY (*)    All other components within normal limits  LIPASE, BLOOD                                                                                                                          Radiology CT ABDOMEN PELVIS W CONTRAST Result Date: 06/11/2024 EXAM: CT ABDOMEN AND PELVIS WITH CONTRAST 06/11/2024 01:32:30 PM TECHNIQUE: CT of the abdomen and pelvis was performed with the administration of intravenous contrast. 75 mL (iohexol  (OMNIPAQUE ) 350 MG/ML injection 75 mL IOHEXOL  350 MG/ML SOLN). Multiplanar reformatted images are provided for review. Automated exposure control, iterative reconstruction, and/or weight-based adjustment of the mA/kV was utilized to reduce the radiation dose to as low as reasonably achievable. COMPARISON: None available. CLINICAL HISTORY: RLQ abdominal pain. FINDINGS: LOWER CHEST: No acute abnormality. LIVER: The liver is unremarkable. GALLBLADDER AND BILE DUCTS: Cholecystectomy. No biliary ductal dilatation. SPLEEN: No acute abnormality. PANCREAS: No pancreatic inflammation. ADRENAL GLANDS: No acute abnormality. KIDNEYS, URETERS AND BLADDER: No stones in the kidneys or ureters. No hydronephrosis. No perinephric or periureteral stranding. No obstructive uropathy. Urinary bladder is unremarkable. GI AND BOWEL: Stomach demonstrates no acute abnormality. Normal appendix. The colon and rectosigmoid colon are normal. There is no bowel obstruction. PERITONEUM AND RETROPERITONEUM: No ascites. No free air. VASCULATURE: Aorta is normal in caliber. LYMPH NODES: No lymphadenopathy. REPRODUCTIVE ORGANS: Post hysterectomy anatomy. BONES AND SOFT TISSUES: No acute osseous abnormality. No focal soft tissue abnormality. IMPRESSION: 1. No acute findings in the abdomen or pelvis. Electronically signed by: Norleen Boxer MD 06/11/2024 03:19 PM EST RP Workstation: [id number]    Pertinent labs & imaging results that were available during my care of  the Shannon Spears were reviewed by me and considered in my medical decision making (see MDM for details).  Medications Ordered in ED Medications  morphine  (PF) 4 MG/ML injection 4 mg (has no administration in time range)  ondansetron  (ZOFRAN -ODT) disintegrating tablet 4 mg (4 mg Oral Given 06/11/24 1115)  sodium chloride  0.9 % bolus 1,000 mL (0 mLs Intravenous Stopped 06/11/24 1446)  iohexol  (OMNIPAQUE ) 350 MG/ML injection 75 mL (75 mLs Intravenous Contrast Given 06/11/24 1333)  Procedures Procedures  (including critical care time)  Medical Decision Making / ED Course    Medical Decision Making:    Aairah Negrette is a 43 y.o. female with past medical history as below, significant for anxiety, bipolar 1 disorder, CKD, fibromyalgia, chronic opiate use who presents to the ED with complaint of right lower quad abdominal pain, nausea. The complaint involves an extensive differential diagnosis and also carries with it a high risk of complications and morbidity.  Serious etiology was considered. Ddx includes but is not limited to: Differential diagnosis includes but is not exclusive to  acute appendicitis, urinary tract infection, endometriosis, bowel obstruction, hernia, colitis, renal colic, gastroenteritis, volvulus etc.   Complete initial physical exam performed, notably the Shannon Spears was in no acute distress.    Reviewed and confirmed nursing documentation for past medical history, family history, social history.  Vital signs reviewed.    RLQ abd pain> - Symptoms last 2 to 3 days, nausea vomiting.  No fever.  Right lower quad abdominal pain, non-peritoneal - UA with blood, hemoglobin is stable.  Slight leukocytosis 11.5.  Alk phos 145, LFTs and bilirubin wnl - CTAP stable - blood/bacteria noted on UA, Shannon Spears denies symptoms UTI - given blood on ua, possible recently passed  stone? Unclear at this time - pain greatly improved  - Shannon Spears is tolerating p.o. w/o difficulty.  HDS.  Abdomen is not peritoneal   Clinical Course as of 06/11/24 1552  Fri Jun 11, 2024  1524 CT a/p wnl [SG]    Clinical Course User Index [SG] Elnor Jayson LABOR, DO    The Shannon Spears's overall condition has improved, the Shannon Spears presents with abdominal pain without signs of peritonitis, or other life-threatening serious etiology. The Shannon Spears understands that at this time there is no evidence for a more malignant underlying process, but the Shannon Spears also understands that early in the process of an illness, an emergency department workup can be falsely reassuring. Detailed discussions were had with the Shannon Spears regarding current findings, and need for close f/u with PCP or on call doctor. The Shannon Spears appears stable for discharge and has been instructed to return immediately if the symptoms worsen in any way, or in 12-24hr if not improved for re-evaluation. Strict abd pain return precautions. Pt reports Shannon Spears will f/u with her pcp next week. Shannon Spears verbalized understanding and is in agreement with current care plan.  All questions answered prior to discharge.                 Additional history obtained: -Additional history obtained from na -External records from outside source obtained and reviewed including: Chart review including previous notes, labs, imaging, consultation notes including  Home meds Primary care documentation   Lab Tests: -I ordered, reviewed, and interpreted labs.   The pertinent results include:   Labs Reviewed  COMPREHENSIVE METABOLIC PANEL WITH GFR - Abnormal; Notable for the following components:      Result Value   Glucose, Bld 100 (*)    Calcium  8.7 (*)    Albumin 3.2 (*)    Alkaline Phosphatase 145 (*)    All other components within normal limits  CBC - Abnormal; Notable for the following components:   WBC 11.5 (*)    Platelets 493 (*)    All other  components within normal limits  URINALYSIS, ROUTINE W REFLEX MICROSCOPIC - Abnormal; Notable for the following components:   Hgb urine dipstick LARGE (*)    Protein, ur 100 (*)    Bacteria, UA  MANY (*)    All other components within normal limits  LIPASE, BLOOD    Notable for labs stable  EKG   EKG Interpretation Date/Time:    Ventricular Rate:    PR Interval:    QRS Duration:    QT Interval:    QTC Calculation:   R Axis:      Text Interpretation:           Imaging Studies ordered: I ordered imaging studies including ctap I independently visualized the following imaging with scope of interpretation limited to determining acute life threatening conditions related to emergency care; findings noted above I agree with the radiologist interpretation If any imaging was obtained with contrast I closely monitored Shannon Spears for any possible adverse reaction a/w contrast administration in the emergency department   Medicines ordered and prescription drug management: Meds ordered this encounter  Medications   ondansetron  (ZOFRAN -ODT) disintegrating tablet 4 mg   sodium chloride  0.9 % bolus 1,000 mL   iohexol  (OMNIPAQUE ) 350 MG/ML injection 75 mL   morphine  (PF) 4 MG/ML injection 4 mg   dicyclomine  (BENTYL ) 20 MG tablet    Sig: Take 1 tablet (20 mg total) by mouth 2 (two) times daily.    Dispense:  20 tablet    Refill:  0   ondansetron  (ZOFRAN ) 4 MG tablet    Sig: Take 1 tablet (4 mg total) by mouth every 8 (eight) hours as needed for nausea or vomiting.    Dispense:  12 tablet    Refill:  0    -I have reviewed the patients home medicines and have made adjustments as needed   Consultations Obtained: na   Cardiac Monitoring: Continuous pulse oximetry interpreted by myself, 99% on RA.    Social Determinants of Health:  Diagnosis or treatment significantly limited by social determinants of health: current smoker, obesity  Counseled Shannon Spears for approximately 3 minutes  regarding smoking cessation. Discussed risks of smoking and how they applied and affected their visit here today. Shannon Spears not ready to quit at this time, however will follow up with their primary doctor when they are.   CPT code: 00593: intermediate counseling for smoking cessation      Reevaluation: After the interventions noted above, I reevaluated the Shannon Spears and found that they have improved  Co morbidities that complicate the Shannon Spears evaluation  Past Medical History:  Diagnosis Date   Anxiety    Arthritis    Bipolar 1 disorder, manic, mild (HCC)    CKD (chronic kidney disease)    Depression    Fibromyalgia    GERD (gastroesophageal reflux disease)    Headache(784.0)    Palpitations       Dispostion: Disposition decision including need for hospitalization was considered, and Shannon Spears discharged from emergency department.    Final Clinical Impression(s) / ED Diagnoses Final diagnoses:  Abdominal pain, unspecified abdominal location        Elnor Jayson LABOR, DO 06/11/24 1553

## 2024-06-11 NOTE — Discharge Instructions (Addendum)
]

## 2024-08-07 ENCOUNTER — Other Ambulatory Visit: Payer: Self-pay | Admitting: Adult Health

## 2024-08-07 DIAGNOSIS — F4312 Post-traumatic stress disorder, chronic: Secondary | ICD-10-CM

## 2024-08-07 DIAGNOSIS — F451 Undifferentiated somatoform disorder: Secondary | ICD-10-CM

## 2024-08-09 ENCOUNTER — Telehealth: Payer: Self-pay | Admitting: Adult Health

## 2024-08-09 ENCOUNTER — Other Ambulatory Visit: Payer: Self-pay

## 2024-08-09 DIAGNOSIS — F411 Generalized anxiety disorder: Secondary | ICD-10-CM

## 2024-08-09 MED ORDER — DIAZEPAM 10 MG PO TABS: 10.0000 mg | ORAL_TABLET | Freq: Three times a day (TID) | ORAL | 0 refills | Status: DC | PRN

## 2024-08-09 NOTE — Telephone Encounter (Signed)
 Pt was due for FU in Dec. Please call to schedule.

## 2024-08-09 NOTE — Telephone Encounter (Signed)
 Pt called back and wants meds sent to [facility] [address], [place]

## 2024-08-09 NOTE — Telephone Encounter (Signed)
 LVM to call for follow up, over due.

## 2024-08-09 NOTE — Telephone Encounter (Signed)
 Corrected pharmacy before provider approved.

## 2024-08-09 NOTE — Telephone Encounter (Signed)
 Pt LVM @ 12:48p requesting refill of Diazepam  to  [facility] - [place], [place] - [address], [place] [address] Phone: [phone]  Fax: [phone]   Next appt 1/21

## 2024-08-09 NOTE — Telephone Encounter (Signed)
Pended enough to appt.  

## 2024-08-25 ENCOUNTER — Encounter: Payer: Self-pay | Admitting: Adult Health

## 2024-08-25 ENCOUNTER — Telehealth: Admitting: Adult Health

## 2024-08-25 DIAGNOSIS — F411 Generalized anxiety disorder: Secondary | ICD-10-CM

## 2024-08-25 DIAGNOSIS — F4312 Post-traumatic stress disorder, chronic: Secondary | ICD-10-CM

## 2024-08-25 DIAGNOSIS — F451 Undifferentiated somatoform disorder: Secondary | ICD-10-CM | POA: Diagnosis not present

## 2024-08-25 DIAGNOSIS — F419 Anxiety disorder, unspecified: Secondary | ICD-10-CM

## 2024-08-25 DIAGNOSIS — F4310 Post-traumatic stress disorder, unspecified: Secondary | ICD-10-CM | POA: Diagnosis not present

## 2024-08-25 DIAGNOSIS — F319 Bipolar disorder, unspecified: Secondary | ICD-10-CM | POA: Diagnosis not present

## 2024-08-25 MED ORDER — LAMOTRIGINE 200 MG PO TABS: 200.0000 mg | ORAL_TABLET | Freq: Every day | ORAL | 2 refills | Status: AC

## 2024-08-25 MED ORDER — HYDROXYZINE HCL 25 MG PO TABS: ORAL_TABLET | ORAL | 2 refills | Status: AC

## 2024-08-25 MED ORDER — DIAZEPAM 10 MG PO TABS: 10.0000 mg | ORAL_TABLET | Freq: Three times a day (TID) | ORAL | 2 refills | Status: AC | PRN

## 2024-08-25 NOTE — Progress Notes (Signed)
 Shannon Spears [id number] [birth year] 44 y.o.  Virtual Visit via Video Note  I connected with pt @ on 08/25/24 at 11:30 AM EST by a video enabled telemedicine application and verified that I am speaking with the correct person using two identifiers.   I discussed the limitations of evaluation and management by telemedicine and the availability of in person appointments. The patient expressed understanding and agreed to proceed.  I discussed the assessment and treatment plan with the patient. The patient was provided an opportunity to ask questions and all were answered. The patient agreed with the plan and demonstrated an understanding of the instructions.   The patient was advised to call back or seek an in-person evaluation if the symptoms worsen or if the condition fails to improve as anticipated.  I provided 25 minutes of non-face-to-face time during this encounter.  The patient was located at home.  The provider was located at [facility].   Angeline LOISE Sayers, NP   Subjective:   Patient ID:  Shannon Spears is a 44 y.o. (DOB October 24, 1980) female.  Chief Complaint: No chief complaint on file.   HPI Shannon Spears presents for follow-up of BPD-1, PTSD, anxiety, and somatic symptom disorder.  Describes mood today as not too good. Pleasant. Reports tearfulness. Mood symptoms - denies depression - I just don't want to do anything. Reports lower interest and motivation - there are things I want to do. Reports anxiety - a constant feeling of not being good enough. Reports irritability - dealing with family stuff. Reports panic attacks - I think that is what's going on at night - waking up with anxiety and chest pain.  Reports worry, rumination and over thinking. Reports obsessive thoughts and acts. Reports ongoing situational stressors. Reports mood is variable. Stating I try to keep my cool, but some days I can and some days I can't. Taking medications as prescribed. Reports her  general health and pain issues managed through [facility] Energy levels lower. Active, does not have a regular exercise routine with physical disabilities. Reports she does not enjoy interests and activities. Married. Lives with husband and daughter. Spending time with family. Reports appetite adequate. Reports weight loss - 185 to 190 from 200 pounds. Sleeps better some nights than others. Reports recent night time awakenings - uncertain of cause. Averages 5 to 6 hours of broken sleep. Reports some daytime napping. Reports focus and concentration difficulties - some days are better than others. Completing tasks. Managing some aspects of household. Stay at home mom. Denies SI or HI.  Denies AH or VH. Denies self harm.  Denies substance use.  Followed by pain management - [facility].  Previous medication trials: Unknown   Review of Systems:  Review of Systems  Musculoskeletal:  Negative for gait problem.  Neurological:  Negative for tremors.  Psychiatric/Behavioral:         Please refer to HPI    Medications: I have reviewed the patient's current medications.  Current Outpatient Medications  Medication Sig Dispense Refill   hydrOXYzine  (ATARAX ) 25 MG tablet TAKE 1 TABLET BY MOUTH UP TO FOUR TIMES A DAY AS NEEDED FOR ANXIETY/ITCHING AND SLEEP 120 tablet 0   albuterol  (VENTOLIN  HFA) 108 (90 Base) MCG/ACT inhaler Inhale 2 puffs into the lungs every 6 (six) hours as needed for wheezing or shortness of breath. 6.7 g 2   Albuterol -Budesonide  (AIRSUPRA ) 90-80 MCG/ACT AERO Inhale 2 puffs into the lungs every 4 (four) hours as needed. 10.7 g 11   azelastine  (ASTELIN )  0.1 % nasal spray Place 1 spray into both nostrils 2 (two) times daily.     CALCIUM -VITAMIN D PO Take 1 tablet by mouth daily.     cyclobenzaprine  (FLEXERIL ) 10 MG tablet Take 1 tablet (10 mg total) by mouth 2 (two) times daily as needed for muscle spasms. 20 tablet 0   diazepam  (VALIUM ) 10 MG tablet Take 1 tablet (10  mg total) by mouth 3 (three) times daily as needed for anxiety. 48 tablet 0   dicyclomine  (BENTYL ) 20 MG tablet Take 1 tablet (20 mg total) by mouth 2 (two) times daily. 20 tablet 0   famotidine  (PEPCID  AC MAXIMUM STRENGTH) 20 MG tablet Take 20 mg by mouth 2 (two) times daily.     fluticasone-salmeterol (ADVAIR) 100-50 MCG/ACT AEPB Inhale into the lungs.     HYDROcodone -acetaminophen  (NORCO/VICODIN) 5-325 MG tablet Take 1 tablet by mouth every 6 (six) hours as needed. 8 tablet 0   ipratropium (ATROVENT) 0.06 % nasal spray Place into the nose.     Lactobacillus (PROBIOTIC ACIDOPHILUS PO) Take 1 capsule by mouth daily.     lamoTRIgine  (LAMICTAL ) 200 MG tablet Take 1 tablet (200 mg total) by mouth at bedtime. 30 tablet 3   levETIRAcetam  (KEPPRA ) 500 MG tablet Take 1,000 mg by mouth 2 (two) times daily.     levothyroxine (SYNTHROID) 25 MCG tablet Take 25 mcg by mouth daily.     lidocaine  (LIDODERM ) 5 % Place 1 patch onto the skin daily. Remove & Discard patch within 12 hours or as directed by MD 30 patch 0   methocarbamol  (ROBAXIN -750) 750 MG tablet Take 1 tablet (750 mg total) by mouth 2 (two) times daily as needed for muscle spasms. 20 tablet 2   metoprolol  succinate (TOPROL -XL) 25 MG 24 hr tablet Take 37.5 mg by mouth at bedtime.     montelukast  (SINGULAIR ) 10 MG tablet Take 10 mg by mouth daily.     nystatin (MYCOSTATIN) 100000 UNIT/ML suspension Take by mouth.     ondansetron  (ZOFRAN ) 4 MG tablet Take 1 tablet (4 mg total) by mouth every 8 (eight) hours as needed for nausea or vomiting. 12 tablet 0   pantoprazole  (PROTONIX ) 40 MG tablet TAKE 1 TABLET BY MOUTH EVERY DAY 30 TO 60 MINUTES BEFORE 1ST MEAL OF THE DAY 30 tablet 2   traMADol  (ULTRAM ) 50 MG tablet Take 50 mg by mouth every 4 (four) hours as needed for moderate pain (pain score 4-6).     Vitamin D, Ergocalciferol, 50000 units CAPS Take 1 capsule by mouth once a week. Thursday     No current facility-administered medications for this  visit.    Medication Side Effects: None  Allergies: Allergies[1]  Past Medical History:  Diagnosis Date   Anxiety    Arthritis    Bipolar 1 disorder, manic, mild (HCC)    CKD (chronic kidney disease)    Depression    Fibromyalgia    GERD (gastroesophageal reflux disease)    Headache(784.0)    Palpitations     Family History  Problem Relation Age of Onset   Bipolar disorder Mother    Diabetes Mother    Hypertension Mother    Hypertension Father    Colon cancer Paternal Grandmother     Social History   Socioeconomic History   Marital status: Married    Spouse name: Not on file   Number of children: Not on file   Years of education: Not on file   Highest education level: Not on  file  Occupational History   Not on file  Tobacco Use   Smoking status: Some Days    Current packs/day: 0.50    Types: Cigarettes   Smokeless tobacco: Never  Vaping Use   Vaping status: Never Used  Substance and Sexual Activity   Alcohol use: Yes    Comment: rarely   Drug use: Yes    Types: Marijuana    Comment: occasionally   Sexual activity: Not on file  Other Topics Concern   Not on file  Social History Narrative   Not on file   Social Drivers of Health   Tobacco Use: High Risk (08/12/2024)   Received from [facility]   Patient History    Smoking Tobacco Use: Some Days    Smokeless Tobacco Use: Never    Passive Exposure: Not on file  Financial Resource Strain: Low Risk (10/28/2023)   Received from [facility]   Overall Financial Resource Strain (CARDIA)    Difficulty of Paying Living Expenses: Not hard at all  Food Insecurity: No Food Insecurity (10/28/2023)   Received from [facility]   Epic    Within the past 12 months, you worried that your food would run out before you got the money to buy more.: Never true    Within the past 12 months, the food you bought just didn't last and you didn't have money to get more.: Never true  Transportation Needs: No Transportation  Needs (10/28/2023)   Received from [facility] - Transportation    Lack of Transportation (Non-Medical): No    Lack of Transportation (Medical): No  Physical Activity: Sufficiently Active (09/24/2021)   Received from [facility]   Exercise Vital Sign    On average, how many days per week do you engage in moderate to strenuous exercise (like a brisk walk)?: 7 days    On average, how many minutes do you engage in exercise at this level?: 40 min  Stress: Stress Concern Present (09/24/2021)   Received from [facility] Occupational Health - Occupational Stress Questionnaire    Feeling of Stress : Very much  Social Connections: Unknown (09/24/2021)   Received from [facility]   Social Connection and Isolation Panel    In a typical week, how many times do you talk on the phone with family, friends, or neighbors?: More than three times a week    How often do you get together with friends or relatives?: Three times a week    How often do you attend church or religious services?: Patient declined    Do you belong to any clubs or organizations such as church groups, unions, fraternal or athletic groups, or school groups?: No    How often do you attend meetings of the clubs or organizations you belong to?: Patient declined    Are you married, widowed, divorced, separated, never married, or living with a partner?: Married  Intimate Partner Violence: Not At Risk (01/18/2023)   Humiliation, Afraid, Rape, and Kick questionnaire    Fear of Current or Ex-Partner: No    Emotionally Abused: No    Physically Abused: No    Sexually Abused: No  Depression (PHQ2-9): Not on file  Alcohol Screen: Not on file  Housing: Low Risk (10/28/2023)   Received from [facility]   Epic    At any time in the past 12 months, were you homeless or living in a shelter (including now)?: No    In  the past 12 months, how many times have you moved where you were living?: 0    In the last 12  months, was there a time when you were not able to pay the mortgage or rent on time?: No  Utilities: Not At Risk (10/28/2023)   Received from [facility] Utilities    Threatened with loss of utilities: No  Health Literacy: Not on file    Past Medical History, Surgical history, Social history, and Family history were reviewed and updated as appropriate.   Please see review of systems for further details on the patient's review from today.   Objective:   Physical Exam:  LMP 09/06/2012   Physical Exam Constitutional:      General: She is not in acute distress. Musculoskeletal:        General: No deformity.  Neurological:     Mental Status: She is alert and oriented to person, place, and time.     Coordination: Coordination normal.  Psychiatric:        Attention and Perception: Attention and perception normal. She does not perceive auditory or visual hallucinations.        Mood and Affect: Mood normal. Mood is not anxious or depressed. Affect is not labile, blunt, angry or inappropriate.        Speech: Speech normal.        Behavior: Behavior normal.        Thought Content: Thought content normal. Thought content is not paranoid or delusional. Thought content does not include homicidal or suicidal ideation. Thought content does not include homicidal or suicidal plan.        Cognition and Memory: Cognition and memory normal.        Judgment: Judgment normal.     Comments: Insight intact     Lab Review:     Component Value Date/Time   NA 140 06/11/2024 1057   NA 135 02/01/2019 1012   K 4.2 06/11/2024 1057   CL 101 06/11/2024 1057   CO2 27 06/11/2024 1057   GLUCOSE 100 (H) 06/11/2024 1057   BUN 10 06/11/2024 1057   BUN 8 02/01/2019 1012   CREATININE 0.89 06/11/2024 1057   CALCIUM  8.7 (L) 06/11/2024 1057   PROT 7.1 06/11/2024 1057   PROT 6.9 02/01/2019 1012   ALBUMIN 3.2 (L) 06/11/2024 1057   ALBUMIN 4.1 02/01/2019 1012   AST 21 06/11/2024 1057   ALT 25  06/11/2024 1057   ALKPHOS 145 (H) 06/11/2024 1057   BILITOT 0.3 06/11/2024 1057   BILITOT 0.3 02/01/2019 1012   GFRNONAA >60 06/11/2024 1057   GFRAA 127 02/01/2019 1012       Component Value Date/Time   WBC 11.5 (H) 06/11/2024 1057   RBC 4.65 06/11/2024 1057   HGB 13.9 06/11/2024 1057   HGB 14.2 02/01/2019 1012   HCT 43.7 06/11/2024 1057   HCT 41.8 02/01/2019 1012   PLT 493 (H) 06/11/2024 1057   PLT 518 (H) 02/01/2019 1012   MCV 94.0 06/11/2024 1057   MCV 96 02/01/2019 1012   MCH 29.9 06/11/2024 1057   MCHC 31.8 06/11/2024 1057   RDW 15.2 06/11/2024 1057   RDW 13.6 02/01/2019 1012   LYMPHSABS 1.2 03/07/2023 0256   LYMPHSABS 3.7 (H) 02/01/2019 1012   MONOABS 0.1 03/07/2023 0256   EOSABS 0.0 03/07/2023 0256   EOSABS 0.3 02/01/2019 1012   BASOSABS 0.0 03/07/2023 0256   BASOSABS 0.1 02/01/2019 1012    No results found for: POCLITH,  LITHIUM   No results found for: PHENYTOIN, PHENOBARB, VALPROATE, CBMZ   .res Assessment: Plan:    Plan:  PDMP reviewed  1. Lamictal  200mg  daily 2. Valium  10mg  TID  3. Atarax  25mg  up to 6 times daily - taking 3 to 4 daily  RTC 3 months  25 minutes spent dedicated to the care of this patient on the date of this encounter to include pre-visit review of records, ordering of medication, post visit documentation, and face-to-face time with the patient discussing BPD-1, PTSD, anxiety, and somatic symptom disorder. Discussed continuing current medication regimen.  Patient advised to contact office with any questions, adverse effects, or acute worsening in signs and symptoms.  Discussed potential benefits, risk, and side effects of benzodiazepines to include potential risk of tolerance and dependence, as well as possible drowsiness.  Advised patient not to drive if experiencing drowsiness and to take lowest possible effective dose to minimize risk of dependence and tolerance.  Counseled patient regarding potential benefits, risks, and  side effects of Lamictal  to include potential risk of Stevens-Johnson syndrome. Advised patient to stop taking Lamictal  and contact office immediately if rash develops and to seek urgent medical attention if rash is severe and/or spreading quickly.  There are no diagnoses linked to this encounter.   Please see After Visit Summary for patient specific instructions.  Future Appointments  Date Time Provider Department Center  08/25/2024 11:30 AM Jeanice Dempsey Nattalie, NP [facility] None    No orders of the defined types were placed in this encounter.     -------------------------------     [1]  Allergies Allergen Reactions   Buprenorphine Shortness Of Breath and Rash    Breathing problems   Penicillins Anaphylaxis, Nausea And Vomiting and Swelling    TOLERATES ANCEF    Gabapentin Nausea And Vomiting   Nsaids     Renal insufficiency   Propoxyphene Nausea Only    darvocet   Tetanus Toxoid-Containing Vaccines     Blister on legs, MRSA   Adhesive [Tape] Dermatitis   Codeine     Mouth went numb   Ibuprofen     Has kidney disease.   Doxycycline Rash
